# Patient Record
Sex: Female | Born: 1935 | Race: Black or African American | Hispanic: No | Smoking: Never smoker
Health system: Southern US, Community
[De-identification: ages and names within clinical notes are randomized; demographics above are authoritative.]

## PROBLEM LIST (undated history)

## (undated) DIAGNOSIS — K5790 Diverticulosis of intestine, part unspecified, without perforation or abscess without bleeding: Secondary | ICD-10-CM

## (undated) DIAGNOSIS — M1990 Unspecified osteoarthritis, unspecified site: Secondary | ICD-10-CM

## (undated) DIAGNOSIS — M4800 Spinal stenosis, site unspecified: Secondary | ICD-10-CM

## (undated) DIAGNOSIS — IMO0001 Reserved for inherently not codable concepts without codable children: Secondary | ICD-10-CM

## (undated) DIAGNOSIS — I10 Essential (primary) hypertension: Secondary | ICD-10-CM

## (undated) DIAGNOSIS — I700 Atherosclerosis of aorta: Secondary | ICD-10-CM

## (undated) DIAGNOSIS — G629 Polyneuropathy, unspecified: Secondary | ICD-10-CM

## (undated) HISTORY — PX: CHOLECYSTECTOMY: SHX55

---

## 1998-02-11 ENCOUNTER — Emergency Department (HOSPITAL_COMMUNITY): Admission: EM | Admit: 1998-02-11 | Discharge: 1998-02-11 | Payer: Self-pay | Admitting: Emergency Medicine

## 1998-08-17 ENCOUNTER — Emergency Department (HOSPITAL_COMMUNITY): Admission: EM | Admit: 1998-08-17 | Discharge: 1998-08-17 | Payer: Self-pay | Admitting: Emergency Medicine

## 1998-08-17 ENCOUNTER — Encounter: Payer: Self-pay | Admitting: Internal Medicine

## 1998-10-30 ENCOUNTER — Ambulatory Visit (HOSPITAL_COMMUNITY): Admission: RE | Admit: 1998-10-30 | Discharge: 1998-10-30 | Payer: Self-pay | Admitting: Internal Medicine

## 1999-11-25 ENCOUNTER — Ambulatory Visit (HOSPITAL_COMMUNITY): Admission: RE | Admit: 1999-11-25 | Discharge: 1999-11-25 | Payer: Self-pay | Admitting: Internal Medicine

## 1999-11-25 ENCOUNTER — Encounter: Payer: Self-pay | Admitting: Internal Medicine

## 2000-09-21 ENCOUNTER — Encounter: Payer: Self-pay | Admitting: Internal Medicine

## 2000-09-21 ENCOUNTER — Encounter: Admission: RE | Admit: 2000-09-21 | Discharge: 2000-09-21 | Payer: Self-pay | Admitting: Internal Medicine

## 2000-11-30 ENCOUNTER — Encounter: Payer: Self-pay | Admitting: Internal Medicine

## 2000-11-30 ENCOUNTER — Ambulatory Visit (HOSPITAL_COMMUNITY): Admission: RE | Admit: 2000-11-30 | Discharge: 2000-11-30 | Payer: Self-pay | Admitting: Internal Medicine

## 2002-05-21 ENCOUNTER — Encounter: Payer: Self-pay | Admitting: Internal Medicine

## 2002-05-21 ENCOUNTER — Encounter: Admission: RE | Admit: 2002-05-21 | Discharge: 2002-05-21 | Payer: Self-pay | Admitting: Internal Medicine

## 2002-05-29 ENCOUNTER — Encounter: Payer: Self-pay | Admitting: Internal Medicine

## 2002-05-29 ENCOUNTER — Ambulatory Visit (HOSPITAL_COMMUNITY): Admission: RE | Admit: 2002-05-29 | Discharge: 2002-05-29 | Payer: Self-pay | Admitting: Internal Medicine

## 2003-02-11 ENCOUNTER — Emergency Department (HOSPITAL_COMMUNITY): Admission: EM | Admit: 2003-02-11 | Discharge: 2003-02-11 | Payer: Self-pay | Admitting: Emergency Medicine

## 2003-02-11 ENCOUNTER — Encounter: Payer: Self-pay | Admitting: Emergency Medicine

## 2003-07-27 ENCOUNTER — Emergency Department (HOSPITAL_COMMUNITY): Admission: AC | Admit: 2003-07-27 | Discharge: 2003-07-27 | Payer: Self-pay | Admitting: Emergency Medicine

## 2003-11-29 ENCOUNTER — Encounter: Admission: RE | Admit: 2003-11-29 | Discharge: 2003-11-29 | Payer: Self-pay | Admitting: Internal Medicine

## 2003-12-05 ENCOUNTER — Encounter: Admission: RE | Admit: 2003-12-05 | Discharge: 2003-12-05 | Payer: Self-pay | Admitting: Internal Medicine

## 2003-12-12 ENCOUNTER — Encounter: Admission: RE | Admit: 2003-12-12 | Discharge: 2003-12-12 | Payer: Self-pay | Admitting: Internal Medicine

## 2005-05-20 ENCOUNTER — Encounter: Admission: RE | Admit: 2005-05-20 | Discharge: 2005-05-20 | Payer: Self-pay | Admitting: Internal Medicine

## 2005-10-24 ENCOUNTER — Encounter: Admission: RE | Admit: 2005-10-24 | Discharge: 2005-10-24 | Payer: Self-pay | Admitting: Internal Medicine

## 2006-07-09 ENCOUNTER — Emergency Department (HOSPITAL_COMMUNITY): Admission: EM | Admit: 2006-07-09 | Discharge: 2006-07-09 | Payer: Self-pay | Admitting: Emergency Medicine

## 2006-08-16 ENCOUNTER — Encounter: Admission: RE | Admit: 2006-08-16 | Discharge: 2006-08-16 | Payer: Self-pay | Admitting: Internal Medicine

## 2006-11-22 ENCOUNTER — Ambulatory Visit (HOSPITAL_COMMUNITY): Admission: RE | Admit: 2006-11-22 | Discharge: 2006-11-22 | Payer: Self-pay | Admitting: Gastroenterology

## 2007-08-22 ENCOUNTER — Ambulatory Visit (HOSPITAL_COMMUNITY): Admission: RE | Admit: 2007-08-22 | Discharge: 2007-08-22 | Payer: Self-pay | Admitting: Internal Medicine

## 2008-11-06 ENCOUNTER — Encounter: Admission: RE | Admit: 2008-11-06 | Discharge: 2008-11-06 | Payer: Self-pay | Admitting: Internal Medicine

## 2009-02-21 ENCOUNTER — Encounter: Admission: RE | Admit: 2009-02-21 | Discharge: 2009-02-21 | Payer: Self-pay | Admitting: Internal Medicine

## 2009-02-27 ENCOUNTER — Encounter: Admission: RE | Admit: 2009-02-27 | Discharge: 2009-02-27 | Payer: Self-pay | Admitting: Internal Medicine

## 2010-06-16 ENCOUNTER — Encounter: Admission: RE | Admit: 2010-06-16 | Discharge: 2010-06-16 | Payer: Self-pay | Admitting: Internal Medicine

## 2010-08-31 ENCOUNTER — Encounter: Payer: Self-pay | Admitting: Internal Medicine

## 2010-12-06 ENCOUNTER — Emergency Department (HOSPITAL_COMMUNITY)
Admission: EM | Admit: 2010-12-06 | Discharge: 2010-12-06 | Disposition: A | Discharge status: Home / Self Care | Payer: Medicare Other | Attending: Emergency Medicine | Admitting: Emergency Medicine

## 2010-12-06 ENCOUNTER — Emergency Department (HOSPITAL_COMMUNITY): Payer: Medicare Other

## 2010-12-06 DIAGNOSIS — E119 Type 2 diabetes mellitus without complications: Secondary | ICD-10-CM | POA: Insufficient documentation

## 2010-12-06 DIAGNOSIS — R112 Nausea with vomiting, unspecified: Secondary | ICD-10-CM | POA: Insufficient documentation

## 2010-12-06 DIAGNOSIS — R55 Syncope and collapse: Secondary | ICD-10-CM | POA: Insufficient documentation

## 2010-12-06 DIAGNOSIS — I10 Essential (primary) hypertension: Secondary | ICD-10-CM | POA: Insufficient documentation

## 2010-12-06 DIAGNOSIS — E669 Obesity, unspecified: Secondary | ICD-10-CM | POA: Insufficient documentation

## 2010-12-06 LAB — URINALYSIS, ROUTINE W REFLEX MICROSCOPIC
Bilirubin Urine: NEGATIVE
Glucose, UA: NEGATIVE mg/dL
Nitrite: NEGATIVE
Specific Gravity, Urine: 1.009 (ref 1.005–1.030)
pH: 7.5 (ref 5.0–8.0)

## 2010-12-06 LAB — DIFFERENTIAL
Basophils Absolute: 0 10*3/uL (ref 0.0–0.1)
Basophils Relative: 0 % (ref 0–1)
Eosinophils Absolute: 0.1 10*3/uL (ref 0.0–0.7)
Eosinophils Relative: 2 % (ref 0–5)
Lymphocytes Relative: 34 % (ref 12–46)
Lymphs Abs: 1.8 10*3/uL (ref 0.7–4.0)
Monocytes Absolute: 0.5 10*3/uL (ref 0.1–1.0)
Monocytes Relative: 10 % (ref 3–12)
Neutro Abs: 2.8 10*3/uL (ref 1.7–7.7)
Neutrophils Relative %: 54 % (ref 43–77)

## 2010-12-06 LAB — COMPREHENSIVE METABOLIC PANEL
AST: 32 U/L (ref 0–37)
CO2: 25 mEq/L (ref 19–32)
Calcium: 9.3 mg/dL (ref 8.4–10.5)
Creatinine, Ser: 1.09 mg/dL (ref 0.4–1.2)
GFR calc Af Amer: 58 mL/min — ABNORMAL LOW (ref >60)
GFR calc non Af Amer: 48 mL/min — ABNORMAL LOW (ref >60)
Total Protein: 7.1 g/dL (ref 6.0–8.3)

## 2010-12-06 LAB — CBC
HCT: 38.7 % (ref 36.0–46.0)
Hemoglobin: 12.4 g/dL (ref 12.0–15.0)
MCH: 26.3 pg (ref 26.0–34.0)
MCHC: 32 g/dL (ref 30.0–36.0)
MCV: 82.2 fL (ref 78.0–100.0)
Platelets: 178 10*3/uL (ref 150–400)
RBC: 4.71 MIL/uL (ref 3.87–5.11)
RDW: 14 % (ref 11.5–15.5)
WBC: 5.3 10*3/uL (ref 4.0–10.5)

## 2010-12-06 LAB — POCT CARDIAC MARKERS
CKMB, poc: 4.7 ng/mL (ref 1.0–8.0)
Myoglobin, poc: 184 ng/mL (ref 12–200)
Troponin i, poc: <0.05 ng/mL (ref 0.00–0.09)

## 2010-12-06 LAB — GLUCOSE, CAPILLARY: Glucose-Capillary: 161 mg/dL — ABNORMAL HIGH (ref 70–99)

## 2012-01-12 ENCOUNTER — Inpatient Hospital Stay (HOSPITAL_COMMUNITY)
Admission: EM | Admit: 2012-01-12 | Discharge: 2012-01-15 | DRG: 305 | Disposition: A | Discharge status: Home Health | Payer: Medicare Other | Source: Ambulatory Visit | Attending: Internal Medicine | Admitting: Internal Medicine

## 2012-01-12 ENCOUNTER — Emergency Department (HOSPITAL_COMMUNITY): Payer: Medicare Other

## 2012-01-12 ENCOUNTER — Encounter (HOSPITAL_COMMUNITY): Payer: Self-pay | Admitting: Emergency Medicine

## 2012-01-12 ENCOUNTER — Inpatient Hospital Stay (HOSPITAL_COMMUNITY): Payer: Medicare Other

## 2012-01-12 DIAGNOSIS — I160 Hypertensive urgency: Secondary | ICD-10-CM

## 2012-01-12 DIAGNOSIS — E119 Type 2 diabetes mellitus without complications: Secondary | ICD-10-CM | POA: Diagnosis present

## 2012-01-12 DIAGNOSIS — Z79899 Other long term (current) drug therapy: Secondary | ICD-10-CM

## 2012-01-12 DIAGNOSIS — E876 Hypokalemia: Secondary | ICD-10-CM | POA: Diagnosis not present

## 2012-01-12 DIAGNOSIS — F09 Unspecified mental disorder due to known physiological condition: Secondary | ICD-10-CM

## 2012-01-12 DIAGNOSIS — E1165 Type 2 diabetes mellitus with hyperglycemia: Secondary | ICD-10-CM

## 2012-01-12 DIAGNOSIS — R4189 Other symptoms and signs involving cognitive functions and awareness: Secondary | ICD-10-CM | POA: Diagnosis present

## 2012-01-12 DIAGNOSIS — I10 Essential (primary) hypertension: Principal | ICD-10-CM | POA: Diagnosis present

## 2012-01-12 DIAGNOSIS — I69919 Unspecified symptoms and signs involving cognitive functions following unspecified cerebrovascular disease: Secondary | ICD-10-CM

## 2012-01-12 DIAGNOSIS — R579 Shock, unspecified: Secondary | ICD-10-CM | POA: Diagnosis present

## 2012-01-12 DIAGNOSIS — R55 Syncope and collapse: Secondary | ICD-10-CM | POA: Diagnosis present

## 2012-01-12 DIAGNOSIS — I674 Hypertensive encephalopathy: Secondary | ICD-10-CM | POA: Diagnosis present

## 2012-01-12 HISTORY — DX: Spinal stenosis, site unspecified: M48.00

## 2012-01-12 HISTORY — DX: Essential (primary) hypertension: I10

## 2012-01-12 HISTORY — DX: Reserved for inherently not codable concepts without codable children: IMO0001

## 2012-01-12 HISTORY — DX: Unspecified osteoarthritis, unspecified site: M19.90

## 2012-01-12 HISTORY — DX: Diverticulosis of intestine, part unspecified, without perforation or abscess without bleeding: K57.90

## 2012-01-12 HISTORY — DX: Polyneuropathy, unspecified: G62.9

## 2012-01-12 HISTORY — DX: Atherosclerosis of aorta: I70.0

## 2012-01-12 LAB — COMPREHENSIVE METABOLIC PANEL
Alkaline Phosphatase: 109 U/L (ref 39–117)
BUN: 13 mg/dL (ref 6–23)
CO2: 26 mEq/L (ref 19–32)
Chloride: 102 mEq/L (ref 96–112)
GFR calc Af Amer: 60 mL/min — ABNORMAL LOW (ref >90)
Glucose, Bld: 143 mg/dL — ABNORMAL HIGH (ref 70–99)
Potassium: 3.5 mEq/L (ref 3.5–5.1)
Total Bilirubin: 1 mg/dL (ref 0.3–1.2)

## 2012-01-12 LAB — CBC
Hemoglobin: 14 g/dL (ref 12.0–15.0)
Hemoglobin: 14.2 g/dL (ref 12.0–15.0)
MCH: 26.4 pg (ref 26.0–34.0)
MCH: 26.6 pg (ref 26.0–34.0)
MCV: 80.3 fL (ref 78.0–100.0)
RBC: 5.3 MIL/uL — ABNORMAL HIGH (ref 3.87–5.11)
RBC: 5.34 MIL/uL — ABNORMAL HIGH (ref 3.87–5.11)
RDW: 14.3 % (ref 11.5–15.5)

## 2012-01-12 LAB — DIFFERENTIAL
Lymphs Abs: 1.5 10*3/uL (ref 0.7–4.0)
Monocytes Relative: 8 % (ref 3–12)
Neutro Abs: 2.5 10*3/uL (ref 1.7–7.7)
Neutrophils Relative %: 55 % (ref 43–77)

## 2012-01-12 LAB — URINALYSIS, ROUTINE W REFLEX MICROSCOPIC
Bilirubin Urine: NEGATIVE
Glucose, UA: NEGATIVE mg/dL
Ketones, ur: NEGATIVE mg/dL
Leukocytes, UA: NEGATIVE
pH: 7.5 (ref 5.0–8.0)

## 2012-01-12 LAB — PRO B NATRIURETIC PEPTIDE: Pro B Natriuretic peptide (BNP): 158 pg/mL (ref 0–450)

## 2012-01-12 LAB — BASIC METABOLIC PANEL
BUN: 12 mg/dL (ref 6–23)
CO2: 26 mEq/L (ref 19–32)
Calcium: 9.4 mg/dL (ref 8.4–10.5)
GFR calc non Af Amer: 49 mL/min — ABNORMAL LOW (ref >90)
Glucose, Bld: 147 mg/dL — ABNORMAL HIGH (ref 70–99)
Sodium: 139 mEq/L (ref 135–145)

## 2012-01-12 LAB — MRSA PCR SCREENING: MRSA by PCR: NEGATIVE

## 2012-01-12 LAB — CARDIAC PANEL(CRET KIN+CKTOT+MB+TROPI)
Relative Index: 1.6 (ref 0.0–2.5)
Total CK: 545 U/L — ABNORMAL HIGH (ref 7–177)

## 2012-01-12 LAB — LIPASE, BLOOD: Lipase: 33 U/L (ref 11–59)

## 2012-01-12 MED ORDER — ONDANSETRON HCL 4 MG/2ML IJ SOLN: 4.0000 mg | Freq: Four times a day (QID) | INTRAMUSCULAR | Status: DC | PRN

## 2012-01-12 MED ORDER — ONDANSETRON HCL 4 MG/2ML IJ SOLN
INTRAMUSCULAR | Status: AC
  Administered 2012-01-12: 4 mg via INTRAVENOUS
  Filled 2012-01-12: qty 2

## 2012-01-12 MED ORDER — NITROGLYCERIN 2 % TD OINT
1.0000 [in_us] | TOPICAL_OINTMENT | Freq: Three times a day (TID) | TRANSDERMAL | Status: DC | PRN
  Filled 2012-01-12: qty 30

## 2012-01-12 MED ORDER — INSULIN ASPART 100 UNIT/ML ~~LOC~~ SOLN
0.0000 [IU] | Freq: Three times a day (TID) | SUBCUTANEOUS | Status: DC
  Administered 2012-01-12 – 2012-01-14 (×3): 2 [IU] via SUBCUTANEOUS
  Administered 2012-01-15: 1 [IU] via SUBCUTANEOUS

## 2012-01-12 MED ORDER — VALSARTAN-HYDROCHLOROTHIAZIDE 160-12.5 MG PO TABS: 1.0000 | ORAL_TABLET | Freq: Every day | ORAL | Status: DC

## 2012-01-12 MED ORDER — LABETALOL HCL 5 MG/ML IV SOLN
10.0000 mg | Freq: Once | INTRAVENOUS | Status: DC
  Filled 2012-01-12: qty 4

## 2012-01-12 MED ORDER — IRBESARTAN 150 MG PO TABS
150.0000 mg | ORAL_TABLET | Freq: Every day | ORAL | Status: DC
  Administered 2012-01-12: 150 mg via ORAL
  Filled 2012-01-12: qty 1

## 2012-01-12 MED ORDER — ONDANSETRON HCL 4 MG PO TABS: 4.0000 mg | ORAL_TABLET | Freq: Four times a day (QID) | ORAL | Status: DC | PRN

## 2012-01-12 MED ORDER — HYDRALAZINE HCL 20 MG/ML IJ SOLN
10.0000 mg | INTRAMUSCULAR | Status: DC | PRN
Start: 2012-01-12 — End: 2012-01-15

## 2012-01-12 MED ORDER — TRAMADOL HCL 50 MG PO TABS
50.0000 mg | ORAL_TABLET | Freq: Three times a day (TID) | ORAL | Status: DC | PRN
  Filled 2012-01-12 (×2): qty 1

## 2012-01-12 MED ORDER — ONDANSETRON HCL 4 MG/2ML IJ SOLN
INTRAMUSCULAR | Status: AC
  Administered 2012-01-12: 08:00:00
  Filled 2012-01-12: qty 2

## 2012-01-12 MED ORDER — SODIUM CHLORIDE 0.9 % IV BOLUS (SEPSIS)
750.0000 mL | Freq: Once | INTRAVENOUS | Status: AC
  Administered 2012-01-12: 750 mL via INTRAVENOUS

## 2012-01-12 MED ORDER — NICARDIPINE HCL IN NACL 20-0.86 MG/200ML-% IV SOLN: 5.0000 mg/h | Freq: Once | INTRAVENOUS | Status: DC

## 2012-01-12 MED ORDER — POTASSIUM CHLORIDE 10 MEQ/100ML IV SOLN
10.0000 meq | INTRAVENOUS | Status: AC
  Administered 2012-01-12 – 2012-01-13 (×4): 10 meq via INTRAVENOUS
  Filled 2012-01-12: qty 400

## 2012-01-12 MED ORDER — CALCIUM CARBONATE ANTACID 500 MG PO CHEW
1.0000 | CHEWABLE_TABLET | Freq: Every day | ORAL | Status: DC | PRN
  Filled 2012-01-12: qty 1

## 2012-01-12 MED ORDER — SODIUM CHLORIDE 0.9 % IV SOLN
INTRAVENOUS | Status: DC
  Administered 2012-01-12: 09:00:00 via INTRAVENOUS

## 2012-01-12 MED ORDER — HYDROCHLOROTHIAZIDE 12.5 MG PO CAPS
12.5000 mg | ORAL_CAPSULE | Freq: Every day | ORAL | Status: DC
  Administered 2012-01-12: 12.5 mg via ORAL
  Filled 2012-01-12: qty 1

## 2012-01-12 MED ORDER — ONDANSETRON HCL 4 MG/2ML IJ SOLN
4.0000 mg | Freq: Once | INTRAMUSCULAR | Status: AC
  Administered 2012-01-12: 4 mg via INTRAVENOUS

## 2012-01-12 MED ORDER — NICARDIPINE HCL IN NACL 20-0.86 MG/200ML-% IV SOLN
5.0000 mg/h | INTRAVENOUS | Status: DC
  Administered 2012-01-12 (×2): 5 mg/h via INTRAVENOUS
  Filled 2012-01-12 (×3): qty 200

## 2012-01-12 NOTE — Progress Notes (Signed)
eLink Physician-Brief Progress Note Patient Name: Samantha Bond DOB: 12/28/1926 MRN: [id number]  Date of Service  01/12/2012   HPI/Events of Note  Pt on cardene drip and became hypotensive.  Pt became altered.  RN called elink for help.  Pt lethargic and BP very labile  eICU Interventions  D/C all anti HTN meds.  GIve volume. Tfr to ICU Place art line.  Tfr to PCCM primary service.   Intervention Category Major Interventions: Shock - evaluation and management  Shan Levans 01/12/2012, 9:53 PM

## 2012-01-12 NOTE — Progress Notes (Signed)
78 year old female who was earlier admitted for possible syncope and malignant hypertension and was started on Cardene had a sudden episode of unresponsiveness and her blood pressure had dropped to systolic of 40. Critical care was consulted. At this time Dr. Delford Field has requested me to examine the patient at bedside. As per the nurse patient had a brief episode of unresponsiveness for a few seconds and she came back to complete consciousness. On my exam patient is alert awake and oriented x3. She is following commands. Moves all extremities 5 x 5. No facial asymmetry. Able to see in both eyes. Tongue is midline. Chest bilateral air entry present. S1-S2 heard.  Abdomen soft nontender. Vital signs noted - blood pressure is improved to systolic of 150s. Discussed with Dr. Delford Field. Critical care is planning to manage patient overnight in ICU. Discussed with patient's daughter.

## 2012-01-12 NOTE — ED Notes (Signed)
Admitting at bedside 

## 2012-01-12 NOTE — Progress Notes (Signed)
CRITICAL VALUE ALERT  Critical value received:  CKMB 8.5  Date of notification:  01/12/2012  Time of notification:  2326  Critical value read back:yes  Nurse who received alert:  Gerre Pebbles, RN  MD notified (1st page):  Dr Mechele Dawley  Time of first page:  2326  MD notified (2nd page):  Time of second page:  Responding MD:  Dr Kendrick Fries  Time MD responded:  2326

## 2012-01-12 NOTE — Consult Note (Signed)
Name: Samantha Bond MRN: [id number] DOB: 1927-06-11    LOS: 0  Referring Provider:  [facility] Reason for Referral:  Hypertensive emergency, AMS  PULMONARY / CRITICAL CARE MEDICINE  HPI:  This is an 78 y/o female who presented to [facility] on 6/5 with inability to walk normally, and some confusion.  Apparently she developed syncope as she was found down by a neighbor so EMS was called.  She was brought to the ED for awake and alert but was found to have a blood pressure of 230/90.  She was admitted by [facility] to 2600 with a cardene gtt but her blood pressure dropped to systolic 73 and she became unresponsive the evening of 6/5.  [facility] transferred her to ICU and PCCM was asked to take over her care.  Past Medical History  Diagnosis Date  . Hypertension   . Diabetes mellitus    Past Surgical History  Procedure Date  . Cholecystectomy    Prior to Admission medications   Medication Sig Start Date End Date Taking? Authorizing Provider  calcium carbonate (TUMS - DOSED IN MG ELEMENTAL CALCIUM) 500 MG chewable tablet Chew 1 tablet by mouth daily as needed. For indigestion.   Yes Historical Provider, MD  glimepiride (AMARYL) 2 MG tablet Take 2 mg by mouth daily.   Yes Historical Provider, MD  traMADol (ULTRAM) 50 MG tablet Take 50 mg by mouth 3 (three) times daily as needed. For pain.   Yes Historical Provider, MD  valsartan-hydrochlorothiazide (DIOVAN-HCT) 160-12.5 MG per tablet Take 1 tablet by mouth daily.   Yes Historical Provider, MD   Allergies No Known Allergies  Family History History reviewed. No pertinent family history. Social History  reports that she has never smoked. She does not have any smokeless tobacco history on file. She reports that she does not drink alcohol or use illicit drugs.  Review Of Systems:  Cannot obtain due to confusion  Brief patient description:  78 y/o female admitted with confusion and hypertensive emergency who developed hypotension and AMS on a nicardipine  gtt.  Events Since Admission:   Current Status:  Vital Signs: Temp:  [97.3 F (36.3 C)-98.7 F (37.1 C)] 98.7 F (37.1 C) (06/05 2048) Pulse Rate:  [45-85] 67  (06/05 2210) Resp:  [10-23] 18  (06/05 2210) BP: (73-230)/(48-162) 156/74 mmHg (06/05 2210) SpO2:  [91 %-100 %] 100 % (06/05 2210) FiO2 (%):  [35 %] 35 % (06/05 2144) Weight:  [81.7 kg (180 lb 1.9 oz)] 81.7 kg (180 lb 1.9 oz) (06/05 1442)  Physical Examination: Gen: asleep but arouses easily, no acute distress HEENT: NCAT, EOMi, OP clear,  PULM: CTA B CV: RRR, no mgr, no JVD AB: BS+, soft, nontender, no hsm Ext: warm, trace edema, no clubbing, no cyanosis Derm: no rash or skin breakdown Neuro: Awake and alert, clear speech but not oriented to situation, time or date, moves all four extremities well but will not cooperate with neurologic exam   Active Problems:  Malignant hypertensive urgency  DM (diabetes mellitus)  Cognitive decline  Syncope   ASSESSMENT AND PLAN  PULMONARY No results found for this basename: PHART:5,PCO2:5,PCO2ART:5,PO2ART:5,HCO3:5,O2SAT:5 in the last 168 hours Ventilator Settings: Vent Mode:  [-]  FiO2 (%):  [35 %] 35 % CXR:  6/5 no acute cardiopulmonary disease ETT:  n/a  A:  No acute issues P:   -monitor O2 saturation/respiratory status  CARDIOVASCULAR  Lab 01/12/12 0914 01/12/12 0909  TROPONINI -- <0.30  LATICACIDVEN 1.4 --  PROBNP -- --  ECG:  6/5 NSR, LVH, no ST wave changes Lines: peripheral  A: Hypertensive emergency and hypertensive encephalopathy; labile blood pressure with hypotension and confusion (resolved with stopping nicardipine and giving fluids) P:  -highest recorded pressure in EPIC 230/99 -Goal BP ~180/100 -hold nicardipine now -agree with IVF -restart home ARB and HCTZ in AM if BP normalizes -will use prn nitroglycerine and hydralazine tonight if SBP >180  A: Syncope x2 (once at home and again 6/5 in house); DDx labile BP (exacerbated by nicardipine  vs. Bradycardia).  With tonight's episode of hypotension and AMS she had sinus brady followed by junctional brady (see printed strips from 21:34 in chart) P: -Agree with TTE -check carotid dopplers -consult EP in AM 6/6 to review tele strip, perhaps cause of her syncope vs. Vagal event?  RENAL  Lab 01/12/12 2212 01/12/12 0908  NA 139 139  K 3.1* 3.5  CL 100 102  CO2 26 26  BUN 12 13  CREATININE 1.02 0.98  CALCIUM 9.4 9.6  MG -- --  PHOS -- --   Intake/Output      06/05 0701 - 06/06 0700   P.O. 50   I.V. (mL/kg) 300 (3.7)   Total Intake(mL/kg) 350 (4.3)   Urine (mL/kg/hr) 300 (0.2)   Total Output 300   Net +50        Foley:    A:  Hypokalemia P:   -replete  GASTROINTESTINAL  Lab 01/12/12 0908  AST 29  ALT 14  ALKPHOS 109  BILITOT 1.0  PROT 7.4  ALBUMIN 4.0    A:  No issues P:   NTD  HEMATOLOGIC  Lab 01/12/12 2212 01/12/12 0908  HGB 14.2 14.0  HCT 42.9 42.8  PLT 174 165  INR -- --  APTT -- --   A:  No issues P: NTD  INFECTIOUS  Lab 01/12/12 2212 01/12/12 0915 01/12/12 0908  WBC 5.2 -- 4.6  PROCALCITON -- <0.10 --   Cultures:  Antibiotics:   A:  No issues P:   NTD  ENDOCRINE  Lab 01/12/12 2143 01/12/12 1735  GLUCAP 175* 195*   A:  DM2 (glucose normal at time of syncope tonight) P:   -SSI, CBG  NEUROLOGIC  A:  Syncope followed by delirium with hypertensive encephalopathy; now with no signs of focal vascular event but sundowning; doubt PRESS as vision intact and no seizures P:   -BP management as above -frequent orientation, reassurance by daughter -if still confused in AM, would consider MRI brain  BEST PRACTICE / DISPOSITION Level of Care:  ICU Primary Service:  PCCM Consultants:   Code Status:  Full Diet:  npo DVT Px:  scd GI Px:  n/a Skin Integrity:  normal Social / Family:  Updated at bedside  Total CC time tonight 45 minutes.  Max Fickle, M.D. Pulmonary and Critical Care Medicine [facility] Pager: [phone]  01/12/2012, 11:22 PM

## 2012-01-12 NOTE — ED Notes (Signed)
Family at bedside. Family updated on pt's condition

## 2012-01-12 NOTE — ED Notes (Signed)
PT refused In and Out Cath. Put PT on bedside.

## 2012-01-12 NOTE — Progress Notes (Signed)
Utilization Review Completed.Dorcas Carrow T6/12/2011

## 2012-01-12 NOTE — ED Notes (Signed)
Patient transported to CT 

## 2012-01-12 NOTE — ED Notes (Signed)
Per EMS, pt from home, pt reports waking up "feeling bad" and walked outside to get fresh air, a bystander walking found pt lying on the front porch unconscious, upon EMS arrival pt lying supine on front porch, incontinent of urine, responding to verbal stimuli, manual BP 240/100, (R) side facial droop, bilateral equal grips and strengths, pt c/o nausea, denies chest pain or sob, pt w/20 g (R) hand, received Zofran 4 mg IVP en route.

## 2012-01-12 NOTE — Progress Notes (Signed)
Spoke with Ms. Ludwig Lean, pt's daughter.  She stated that pt has not been taking her  BP med in the past 2 weeks because she could not find it.  Sometimes, she forgot if she has taken it or not.  Encouraged Ms. Cummings to prepare her mother's med in a pill box for her to take as scheduled.  She verbalized understanding.  She stated that she lives closed by her mother.

## 2012-01-12 NOTE — Progress Notes (Signed)
Pt was symptomatic with sudden drop in blood pressure (systolic of 73) and heart rate 44. Nicardipine drip was discontinued and bolus of normal saline was given per MD orders. Patient is to be transferred to the ICU

## 2012-01-12 NOTE — ED Notes (Signed)
Patient undressed and in a gown. Cardiac monitor, blood pressure cuff, and pulse oximetry on.

## 2012-01-12 NOTE — H&P (Signed)
PCP:   Pearla Dubonnet, MD, MD   Chief Complaint:  Not feeling right  HPI: [name] is a 78 year old female with history of hypertension, diabetes, memory problems was in her usual state of health until yesterday. This morning upon waking up she did not feel quite right, she felt very hot and also recalls not being able to walk properly, and then recalls walking outside to get the newspaper,after that doesn't remember anything. She was seen by passersby, passed out on her porch, subsequently EMS was called upon their arrival she was alert awake oriented x3 Upon arrival in emergency room noting a blood pressure of 230/90, she denies any complaints at this time except for mild pain in her left buttock. Denies any chest pain, shortness of breath,  Palpitations, weakness or numbness of any extremities denies any tongue biting, no history of witnessed seizures or bowel or bladder incontinence. She is unable to recall if she's been taking her medicines regularly, remembers misplacing her medicines 2 weeks ago, then found them a week back .   Allergies:  No Known Allergies    Past Medical History  Diagnosis Date  . Hypertension   . Diabetes mellitus   Memory decline  Past Surgical History  Procedure Date  . Cholecystectomy     Prior to Admission medications   Medication Sig Start Date End Date Taking? Authorizing Provider  calcium carbonate (TUMS - DOSED IN MG ELEMENTAL CALCIUM) 500 MG chewable tablet Chew 1 tablet by mouth daily as needed. For indigestion.   Yes Historical Provider, MD  glimepiride (AMARYL) 2 MG tablet Take 2 mg by mouth daily.   Yes Historical Provider, MD  traMADol (ULTRAM) 50 MG tablet Take 50 mg by mouth 3 (three) times daily as needed. For pain.   Yes Historical Provider, MD  valsartan-hydrochlorothiazide (DIOVAN-HCT) 160-12.5 MG per tablet Take 1 tablet by mouth daily.   Yes Historical Provider, MD    Social History: lives at home alone, denies any  alcohol or tobacco use, daughter lives close by  No family history on file.  Review of Systems:  Constitutional: Denies fever, chills, diaphoresis, appetite change and fatigue.  HEENT: Denies photophobia, eye pain, redness, hearing loss, ear pain, congestion, sore throat, rhinorrhea, sneezing, mouth sores, trouble swallowing, neck pain, neck stiffness and tinnitus.   Respiratory: Denies SOB, DOE, cough, chest tightness,  and wheezing.   Cardiovascular: Denies chest pain, palpitations and leg swelling.  Gastrointestinal: Denies nausea, vomiting, abdominal pain, diarrhea, constipation, blood in stool and abdominal distention.  Genitourinary: Denies dysuria, urgency, frequency, hematuria, flank pain and difficulty urinating.  Musculoskeletal: Denies myalgias, back pain, joint swelling, arthralgias and gait problem.  Skin: Denies pallor, rash and wound.  Neurological: Denies dizziness, seizures, syncope, weakness, light-headedness, numbness and headaches.  Hematological: Denies adenopathy. Easy bruising, personal or family bleeding history  Psychiatric/Behavioral: Denies suicidal ideation, mood changes, confusion, nervousness, sleep disturbance and agitation   Physical Exam: Blood pressure 199/125, pulse 63, temperature 97.3 F (36.3 C), temperature source Oral, resp. rate 15, SpO2 95.00%. Gen: AAOx3, no distress CVS: SiS2/RRR, no m/r/g Lungs: CTAB Abd: soft, NT, BS present Ext: no edema, c/c Neuro: no focal deficits, neuro 5/5, sensations light touch intact Reflexes:2 plus b/l, plantars down going, cranial nerves 2-12 intact, gait not assessed  Labs on Admission:  Results for orders placed during the hospital encounter of 01/12/12 (from the past 48 hour(s))  CBC     Status: Abnormal   Collection Time   01/12/12  9:08 AM  Component Value Range Comment   WBC 4.6  4.0 - 10.5 (K/uL)    RBC 5.30 (*) 3.87 - 5.11 (MIL/uL)    Hemoglobin 14.0  12.0 - 15.0 (g/dL)    HCT 14.7  82.9 - 56.2  (%)    MCV 80.8  78.0 - 100.0 (fL)    MCH 26.4  26.0 - 34.0 (pg)    MCHC 32.7  30.0 - 36.0 (g/dL)    RDW 13.0  86.5 - 78.4 (%)    Platelets 165  150 - 400 (K/uL)   DIFFERENTIAL     Status: Normal   Collection Time   01/12/12  9:08 AM      Component Value Range Comment   Neutrophils Relative 55  43 - 77 (%)    Neutro Abs 2.5  1.7 - 7.7 (K/uL)    Lymphocytes Relative 34  12 - 46 (%)    Lymphs Abs 1.5  0.7 - 4.0 (K/uL)    Monocytes Relative 8  3 - 12 (%)    Monocytes Absolute 0.4  0.1 - 1.0 (K/uL)    Eosinophils Relative 2  0 - 5 (%)    Eosinophils Absolute 0.1  0.0 - 0.7 (K/uL)    Basophils Relative 0  0 - 1 (%)    Basophils Absolute 0.0  0.0 - 0.1 (K/uL)   COMPREHENSIVE METABOLIC PANEL     Status: Abnormal   Collection Time   01/12/12  9:08 AM      Component Value Range Comment   Sodium 139  135 - 145 (mEq/L)    Potassium 3.5  3.5 - 5.1 (mEq/L)    Chloride 102  96 - 112 (mEq/L)    CO2 26  19 - 32 (mEq/L)    Glucose, Bld 143 (*) 70 - 99 (mg/dL)    BUN 13  6 - 23 (mg/dL)    Creatinine, Ser 6.96  0.50 - 1.10 (mg/dL)    Calcium 9.6  8.4 - 10.5 (mg/dL)    Total Protein 7.4  6.0 - 8.3 (g/dL)    Albumin 4.0  3.5 - 5.2 (g/dL)    AST 29  0 - 37 (U/L) HEMOLYSIS AT THIS LEVEL MAY AFFECT RESULT   ALT 14  0 - 35 (U/L)    Alkaline Phosphatase 109  39 - 117 (U/L)    Total Bilirubin 1.0  0.3 - 1.2 (mg/dL)    GFR calc non Af Amer 52 (*) >90 (mL/min)    GFR calc Af Amer 60 (*) >90 (mL/min)   LIPASE, BLOOD     Status: Normal   Collection Time   01/12/12  9:08 AM      Component Value Range Comment   Lipase 33  11 - 59 (U/L)   TROPONIN I     Status: Normal   Collection Time   01/12/12  9:09 AM      Component Value Range Comment   Troponin I <0.30  <0.30 (ng/mL)   LACTIC ACID, PLASMA     Status: Normal   Collection Time   01/12/12  9:14 AM      Component Value Range Comment   Lactic Acid, Venous 1.4  0.5 - 2.2 (mmol/L)   PROCALCITONIN     Status: Normal   Collection Time   01/12/12  9:15 AM       Component Value Range Comment   Procalcitonin <0.10     URINALYSIS, ROUTINE W REFLEX MICROSCOPIC     Status: Normal  Collection Time   01/12/12 10:18 AM      Component Value Range Comment   Color, Urine YELLOW  YELLOW     APPearance CLEAR  CLEAR     Specific Gravity, Urine 1.013  1.005 - 1.030     pH 7.5  5.0 - 8.0     Glucose, UA NEGATIVE  NEGATIVE (mg/dL)    Hgb urine dipstick NEGATIVE  NEGATIVE     Bilirubin Urine NEGATIVE  NEGATIVE     Ketones, ur NEGATIVE  NEGATIVE (mg/dL)    Protein, ur NEGATIVE  NEGATIVE (mg/dL)    Urobilinogen, UA 1.0  0.0 - 1.0 (mg/dL)    Nitrite NEGATIVE  NEGATIVE     Leukocytes, UA NEGATIVE  NEGATIVE  MICROSCOPIC NOT DONE ON URINES WITH NEGATIVE PROTEIN, BLOOD, LEUKOCYTES, NITRITE, OR GLUCOSE <1000 mg/dL.    Radiological Exams on Admission: Dg Chest 2 View  01/12/2012  *RADIOLOGY REPORT*  Clinical Data: Cough.  Syncope.  Fall.  History of diabetes and hypertension.  CHEST - 2 VIEW  Comparison: Portable chest x-ray 12/06/2010 and CT chest 12/05/2003.  Findings: Cardiac silhouette moderately enlarged but stable. Pulmonary venous hypertension without overt edema.  Lungs clear. Bronchovascular markings normal.  Pulmonary vascularity normal.  No pneumothorax.  No pleural effusions.  Mild degenerative changes involving the thoracic spine and generalized osteopenia suspected.  IMPRESSION: Stable cardiomegaly.  Pulmonary venous hypertension without overt edema.  No acute cardiopulmonary disease.  Original Report Authenticated By: Arnell Sieving, M.D.   Ct Head Wo Contrast  01/12/2012  *RADIOLOGY REPORT*  Clinical Data: Altered mental status.  No acute injury.  CT HEAD WITHOUT CONTRAST  Technique:  Contiguous axial images were obtained from the base of the skull through the vertex without contrast.  Comparison: Head CT 07/09/2006.  Findings: There is extensive chronic periventricular white matter disease which appears stable.  No acute cortical infarct is identified.   There is no evidence of acute intracranial hemorrhage, mass lesion, brain edema or extra-axial fluid collection. Ventricular dilatation does not appear significantly changed.  The visualized paranasal sinuses and mastoid air cells are clear. The calvarium is intact.  IMPRESSION: Stable chronic small vessel ischemic changes and atrophy.  No acute intracranial findings identified.  Original Report Authenticated By: Gerrianne Scale, M.D.   EKG: with LVH    Assessment/Plan 1. Malignant hypertensive urgency 2. Suspected syncope 3.  DM (diabetes mellitus) 4.  Cognitive decline 5. LVH  Continue Nicardipine drip to lower BP by 25% today Resume PO Valsartan /HCTZ, will probably need additional PO meds, will not do all changes at once to avoid precipitous drop in BP. Will need assistance at home to ensure compliance especially given cognitive decline Suspect Syncope was secondary to 1, will monitor on Tele and get 2D echo Hold oral hypoglycemics, Novolog SSI for now. Code status discussed with Patient, wishes to be Full Code  Time Spent on Admission:  [facility] Pager: [phone] 01/12/2012, 1:34 PM

## 2012-01-12 NOTE — Progress Notes (Signed)
Explained to patient and daughter that since patient is newly admitted, bed alarm will be on to  Alarm staff that pt is trying to get out of bed.  This will protect the patient from falling on the floor.  When the bed alarm goes off, staff will be right here to check on the patient.  They both verbalized understanding.

## 2012-01-12 NOTE — ED Provider Notes (Addendum)
History     CSN: [id number]  Arrival date & time 01/12/12  0805   First MD Initiated Contact with Patient 01/12/12 [phone]      Chief Complaint  Patient presents with  . Loss of Consciousness    HPI Pt was seen at 0835.  Per pt, c/o gradual onset and persistence of constantly "not feeling well" since yesterday.  Has been associated with feeling nauseated.  EMS states pt reportedly went outside this morning "for some fresh air" and was found by a bystander unresponsive and incont of urine. Upon EMS arrival to scene, pt was A&O with her only complaint of feeling "weak all over."  Denies CP/palpitations, no cough/SOB, no abd pain, no back pain, no fevers, no N/V/D, no focal motor weakness, no tingling/numbness in extremities.     Past Medical History  Diagnosis Date  . Hypertension   . Diabetes mellitus     Past Surgical History  Procedure Date  . Cholecystectomy      History  Substance Use Topics  . Smoking status: Never Smoker   . Smokeless tobacco: Not on file  . Alcohol Use: No    Review of Systems ROS: Statement: All systems negative except as marked or noted in the HPI; Constitutional: Negative for fever and chills. ; ; Eyes: Negative for eye pain, redness and discharge. ; ; ENMT: Negative for ear pain, hoarseness, nasal congestion, sinus pressure and sore throat. ; ; Cardiovascular: Negative for chest pain, palpitations, diaphoresis, dyspnea and peripheral edema. ; ; Respiratory: Negative for cough, wheezing and stridor. ; ; Gastrointestinal: Negative for nausea, vomiting, diarrhea, abdominal pain, blood in stool, hematemesis, jaundice and rectal bleeding. . ; ; Genitourinary: Negative for dysuria, flank pain and hematuria. ; ; Musculoskeletal: Negative for back pain and neck pain. Negative for swelling and trauma.; ; Skin: Negative for pruritus, rash, abrasions, blisters, bruising and skin lesion.; ; Neuro: Negative for headache, lightheadedness and neck stiffness. Negative for  altered level of consciousness , altered mental status, extremity weakness, paresthesias, involuntary movement, seizure and +generalized weakness, syncope.     Allergies  Review of patient's allergies indicates no known allergies.  Home Medications   Current Outpatient Rx  Name Route Sig Dispense Refill  . CALCIUM CARBONATE ANTACID 500 MG PO CHEW Oral Chew 1 tablet by mouth daily as needed. For indigestion.    Marland Kitchen GLIMEPIRIDE 2 MG PO TABS Oral Take 2 mg by mouth daily.    . TRAMADOL HCL 50 MG PO TABS Oral Take 50 mg by mouth 3 (three) times daily as needed. For pain.    Marland Kitchen VALSARTAN-HYDROCHLOROTHIAZIDE 160-12.5 MG PO TABS Oral Take 1 tablet by mouth daily.      BP 227/98  Pulse 60  Temp(Src) 97.3 F (36.3 C) (Oral)  Resp 15  SpO2 96%  Physical Exam 0845: Physical examination:  Nursing notes reviewed; Vital signs and O2 SAT reviewed;  Constitutional: Well developed, Well nourished, In no acute distress; Head:  Normocephalic, atraumatic; Eyes: EOMI, PERRL, No scleral icterus; ENMT: Mouth and pharynx normal, Mucous membranes dry; Neck: Supple, Full range of motion, No lymphadenopathy; Cardiovascular: Regular rate and rhythm, No murmur or gallop; Respiratory: Breath sounds clear & equal bilaterally, No rales, rhonchi, wheezes, or rub, Normal respiratory effort/excursion; Chest: Nontender, Movement normal; Abdomen: Soft, Nontender, Nondistended, Normal bowel sounds;; Extremities: Pulses normal, No tenderness, No edema, No calf edema or asymmetry.; Neuro: AA&Ox3, Major CN grossly intact. Speech clear, no facial droop. No gross focal motor or sensory deficits in  extremities.; Skin: Color normal, Warm, Dry.    ED Course  Procedures    MDM  MDM Reviewed: previous chart, nursing note and vitals Reviewed previous: ECG Interpretation: ECG, labs, x-ray and CT scan      Date: 01/12/2012  Rate: 55  Rhythm: normal sinus rhythm and premature atrial contractions (PAC)  QRS Axis: normal   Intervals: normal  ST/T Wave abnormalities: normal  Conduction Disutrbances:nonspecific intraventricular conduction delay  Narrative Interpretation: LVH  Old EKG Reviewed: unchanged; no significant changes from previous EKG dated 12/06/2010.  Results for orders placed during the hospital encounter of 01/12/12  CBC      Component Value Range   WBC 4.6  4.0 - 10.5 (K/uL)   RBC 5.30 (*) 3.87 - 5.11 (MIL/uL)   Hemoglobin 14.0  12.0 - 15.0 (g/dL)   HCT 16.1  09.6 - 04.5 (%)   MCV 80.8  78.0 - 100.0 (fL)   MCH 26.4  26.0 - 34.0 (pg)   MCHC 32.7  30.0 - 36.0 (g/dL)   RDW 40.9  81.1 - 91.4 (%)   Platelets 165  150 - 400 (K/uL)  DIFFERENTIAL      Component Value Range   Neutrophils Relative 55  43 - 77 (%)   Neutro Abs 2.5  1.7 - 7.7 (K/uL)   Lymphocytes Relative 34  12 - 46 (%)   Lymphs Abs 1.5  0.7 - 4.0 (K/uL)   Monocytes Relative 8  3 - 12 (%)   Monocytes Absolute 0.4  0.1 - 1.0 (K/uL)   Eosinophils Relative 2  0 - 5 (%)   Eosinophils Absolute 0.1  0.0 - 0.7 (K/uL)   Basophils Relative 0  0 - 1 (%)   Basophils Absolute 0.0  0.0 - 0.1 (K/uL)  COMPREHENSIVE METABOLIC PANEL      Component Value Range   Sodium 139  135 - 145 (mEq/L)   Potassium 3.5  3.5 - 5.1 (mEq/L)   Chloride 102  96 - 112 (mEq/L)   CO2 26  19 - 32 (mEq/L)   Glucose, Bld 143 (*) 70 - 99 (mg/dL)   BUN 13  6 - 23 (mg/dL)   Creatinine, Ser 7.82  0.50 - 1.10 (mg/dL)   Calcium 9.6  8.4 - 95.6 (mg/dL)   Total Protein 7.4  6.0 - 8.3 (g/dL)   Albumin 4.0  3.5 - 5.2 (g/dL)   AST 29  0 - 37 (U/L)   ALT 14  0 - 35 (U/L)   Alkaline Phosphatase 109  39 - 117 (U/L)   Total Bilirubin 1.0  0.3 - 1.2 (mg/dL)   GFR calc non Af Amer 52 (*) >90 (mL/min)   GFR calc Af Amer 60 (*) >90 (mL/min)  LIPASE, BLOOD      Component Value Range   Lipase 33  11 - 59 (U/L)  LACTIC ACID, PLASMA      Component Value Range   Lactic Acid, Venous 1.4  0.5 - 2.2 (mmol/L)  PROCALCITONIN      Component Value Range   Procalcitonin <0.10      TROPONIN I      Component Value Range   Troponin I <0.30  <0.30 (ng/mL)  URINALYSIS, ROUTINE W REFLEX MICROSCOPIC      Component Value Range   Color, Urine YELLOW  YELLOW    APPearance CLEAR  CLEAR    Specific Gravity, Urine 1.013  1.005 - 1.030    pH 7.5  5.0 - 8.0    Glucose,  UA NEGATIVE  NEGATIVE (mg/dL)   Hgb urine dipstick NEGATIVE  NEGATIVE    Bilirubin Urine NEGATIVE  NEGATIVE    Ketones, ur NEGATIVE  NEGATIVE (mg/dL)   Protein, ur NEGATIVE  NEGATIVE (mg/dL)   Urobilinogen, UA 1.0  0.0 - 1.0 (mg/dL)   Nitrite NEGATIVE  NEGATIVE    Leukocytes, UA NEGATIVE  NEGATIVE     Dg Chest 2 View 01/12/2012  *RADIOLOGY REPORT*  Clinical Data: Cough.  Syncope.  Fall.  History of diabetes and hypertension.  CHEST - 2 VIEW  Comparison: Portable chest x-ray 12/06/2010 and CT chest 12/05/2003.  Findings: Cardiac silhouette moderately enlarged but stable. Pulmonary venous hypertension without overt edema.  Lungs clear. Bronchovascular markings normal.  Pulmonary vascularity normal.  No pneumothorax.  No pleural effusions.  Mild degenerative changes involving the thoracic spine and generalized osteopenia suspected.  IMPRESSION: Stable cardiomegaly.  Pulmonary venous hypertension without overt edema.  No acute cardiopulmonary disease.  Original Report Authenticated By: Arnell Sieving, M.D.   Ct Head Wo Contrast 01/12/2012  *RADIOLOGY REPORT*  Clinical Data: Altered mental status.  No acute injury.  CT HEAD WITHOUT CONTRAST  Technique:  Contiguous axial images were obtained from the base of the skull through the vertex without contrast.  Comparison: Head CT 07/09/2006.  Findings: There is extensive chronic periventricular white matter disease which appears stable.  No acute cortical infarct is identified.  There is no evidence of acute intracranial hemorrhage, mass lesion, brain edema or extra-axial fluid collection. Ventricular dilatation does not appear significantly changed.  The visualized paranasal  sinuses and mastoid air cells are clear. The calvarium is intact.  IMPRESSION: Stable chronic small vessel ischemic changes and atrophy.  No acute intracranial findings identified.  Original Report Authenticated By: Gerrianne Scale, M.D.     11:53 AM:   Pt continues HTN, has hx of same.  Do not want to precipitously drop MAP more than 20-25% at this time, will give dose of IVP labetalol.  No signs of acute encephalopathy, continues AAOx3, neuro exam unchanged.  Denies CP, no SOB, no abd pain.  Dx testing d/w pt and family.  Questions answered.  Verb understanding, agreeable to admit. T/C to [facility], case discussed, including:  HPI, pertinent PM/SHx, VS/PE, dx testing, ED course and treatment:  Agreeable to admit, requests to start IV cardene gtt instead IVP dose of labetalol, obtain step down bed to team 5, she will be down to ED for eval.         Laray Anger, DO 01/14/12 1531

## 2012-01-12 NOTE — ED Notes (Signed)
Pt reports waking 0700 this am "feeling so hot" decided to go outside to get her newspaper and some fresh air, reports she felt weak, "could hardly go," got hot and set down on her front porch, states "I had no strength anymore." Pt c/o nauseous and generalized abd discomfort, pt denies chest/back pain, cough, sob, or diarrhea. Pt reports diaphoresis has subsided, pt presents to ED vomiting and diaphoretic. Pt a/o x4, neuro intact, no arm drift, equal strengths and grips.

## 2012-01-13 ENCOUNTER — Encounter (HOSPITAL_COMMUNITY): Payer: Self-pay | Admitting: Cardiology

## 2012-01-13 DIAGNOSIS — I059 Rheumatic mitral valve disease, unspecified: Secondary | ICD-10-CM

## 2012-01-13 DIAGNOSIS — R55 Syncope and collapse: Secondary | ICD-10-CM

## 2012-01-13 LAB — URINE CULTURE: Culture  Setup Time: 201306051527

## 2012-01-13 LAB — COMPREHENSIVE METABOLIC PANEL
ALT: 14 U/L (ref 0–35)
AST: 27 U/L (ref 0–37)
Alkaline Phosphatase: 102 U/L (ref 39–117)
CO2: 23 mEq/L (ref 19–32)
Calcium: 9.7 mg/dL (ref 8.4–10.5)
Chloride: 100 mEq/L (ref 96–112)
GFR calc Af Amer: 55 mL/min — ABNORMAL LOW (ref >90)
GFR calc non Af Amer: 47 mL/min — ABNORMAL LOW (ref >90)
Glucose, Bld: 127 mg/dL — ABNORMAL HIGH (ref 70–99)
Potassium: 3.9 mEq/L (ref 3.5–5.1)
Sodium: 137 mEq/L (ref 135–145)
Total Bilirubin: 0.8 mg/dL (ref 0.3–1.2)

## 2012-01-13 LAB — GLUCOSE, CAPILLARY
Glucose-Capillary: 130 mg/dL — ABNORMAL HIGH (ref 70–99)
Glucose-Capillary: 107 mg/dL — ABNORMAL HIGH (ref 70–99)

## 2012-01-13 LAB — CBC
Hemoglobin: 13.6 g/dL (ref 12.0–15.0)
MCH: 26 pg (ref 26.0–34.0)
Platelets: 179 10*3/uL (ref 150–400)
RBC: 5.24 MIL/uL — ABNORMAL HIGH (ref 3.87–5.11)
WBC: 4.9 10*3/uL (ref 4.0–10.5)

## 2012-01-13 MED ORDER — MIDAZOLAM HCL 2 MG/2ML IJ SOLN: 2.0000 mg | INTRAMUSCULAR | Status: DC | PRN

## 2012-01-13 MED ORDER — HALOPERIDOL LACTATE 5 MG/ML IJ SOLN
10.0000 mg | INTRAMUSCULAR | Status: DC | PRN
Start: 2012-01-13 — End: 2012-01-14
  Administered 2012-01-13: 10 mg via INTRAVENOUS
  Filled 2012-01-13 (×2): qty 2

## 2012-01-13 MED ORDER — HALOPERIDOL LACTATE 5 MG/ML IJ SOLN
INTRAMUSCULAR | Status: AC
  Administered 2012-01-13: 5 mg via INTRAVENOUS
  Filled 2012-01-13: qty 1

## 2012-01-13 MED ORDER — MIDAZOLAM HCL 2 MG/2ML IJ SOLN
INTRAMUSCULAR | Status: AC
  Administered 2012-01-13: 2 mg
  Filled 2012-01-13: qty 2

## 2012-01-13 MED ORDER — HALOPERIDOL LACTATE 5 MG/ML IJ SOLN
5.0000 mg | Freq: Once | INTRAMUSCULAR | Status: AC
  Administered 2012-01-13: 5 mg via INTRAVENOUS

## 2012-01-13 MED ORDER — SODIUM CHLORIDE 0.9 % IV SOLN
INTRAVENOUS | Status: DC
  Administered 2012-01-13: 01:00:00 via INTRAVENOUS

## 2012-01-13 MED ORDER — LORAZEPAM 2 MG/ML IJ SOLN: 1.0000 mg | INTRAMUSCULAR | Status: DC | PRN

## 2012-01-13 MED ORDER — HALOPERIDOL LACTATE 5 MG/ML IJ SOLN
2.0000 mg | Freq: Four times a day (QID) | INTRAMUSCULAR | Status: DC | PRN
  Administered 2012-01-14: 2 mg via INTRAVENOUS
  Filled 2012-01-13: qty 1
  Filled 2012-01-13: qty 0.4

## 2012-01-13 NOTE — Consult Note (Signed)
CARDIOLOGY CONSULT NOTE  Patient ID: Samantha Bond MRN: [id number] DOB/AGE: 01-26-1927 78 y.o.  Admit date: 01/12/2012 Primary Physician Pearla Dubonnet, MD Primary Cardiologist None Chief Complaint  Syncope, bradycardia  HPI: The patient was admitted after a syncopal episode unwitnessed.  She was brought to the ER and reported generalized weakness.  Her BP was 203/90. Per records she had not been able to find her BP meds recently and so had been off of these. She had some AMS with head CT showing no acute disease. She was admitted to step down and was treated with nicardipine. However, she subsequently developed hypotension and bradycardia.  She had worsening acute delirium and agitation.  There was a brief episode of unresponsiveness.  HR was in the 40s with some junctional rhythm.  She was moved to ICU.   We are consulted to consider further management of bradyarrhythmias.  On telemetry that I can review she has had no sustained bradyarrhythmias and no pauses.     Unfortunately the patient has been sedated recently and is currently unable to answer any questions.  I was able to speak with her daughter at the bedside.  She has had one syncopal episode in April of last year.  ER records demonstrate no clear etiology.  She does not complain to her family about chest pain, palpitations, SOB.  She reports being weak.  Past Medical History  Diagnosis Date  . Hypertension   . Diabetes mellitus   . DJD (degenerative joint disease)   . Peripheral neuropathy   . Mild aortic sclerosis   . Spinal stenosis   . Diverticulosis     Past Surgical History  Procedure Date  . Cholecystectomy     No Known Allergies Prescriptions prior to admission  Medication Sig Dispense Refill  . calcium carbonate (TUMS - DOSED IN MG ELEMENTAL CALCIUM) 500 MG chewable tablet Chew 1 tablet by mouth daily as needed. For indigestion.      Marland Kitchen glimepiride (AMARYL) 2 MG tablet Take 2 mg by mouth daily.      .  traMADol (ULTRAM) 50 MG tablet Take 50 mg by mouth 3 (three) times daily as needed. For pain.      . valsartan-hydrochlorothiazide (DIOVAN-HCT) 160-12.5 MG per tablet Take 1 tablet by mouth daily.       History reviewed. No pertinent family history. (Patient is unable to give this history.)  History   Social History  . Marital Status: Widowed    Spouse Name: N/A    Number of Children: N/A  . Years of Education: N/A   Social History Main Topics  . Smoking status: Never Smoker    Social History Narrative  . Lives alone and is very independent.  She drives     ROS: Unobtainable with the patient sedated.    Physical Exam: Blood pressure 147/77, pulse 66, temperature 98.5 F (36.9 C), temperature source Oral, resp. rate 16, height 5\' 7"  (1.702 m), weight 180 lb 1.9 oz (81.7 kg), SpO2 94.00%.  GENERAL:  Well appearing HEENT:  Pupils equal round and reactive, fundi not visualized, dry NECK:  Jugular waveform within normal limits, carotid upstroke brisk and symmetric, no bruits, no thyromegaly LYMPHATICS:  No cervical, inguinal adenopathy LUNGS:  Clear to auscultation bilaterally BACK:  No CVA tenderness CHEST:  Unremarkable HEART:  PMI not displaced or sustained,S1 and S2 within normal limits, no S3, no S4, no clicks, no rubs, no murmurs ABD:  Flat, positive bowel sounds normal in frequency in pitch, no  bruits, no rebound, no guarding, no midline pulsatile mass, no hepatomegaly, no splenomegaly EXT:  2 plus pulses throughout, no edema, no cyanosis no clubbing SKIN:  No rashes no nodules, no bruising NEURO:  Sedated.  Moves all extremities.      Labs: Lab Results  Component Value Date   BUN 13 01/13/2012   Lab Results  Component Value Date   CREATININE 1.06 01/13/2012   Lab Results  Component Value Date   NA 137 01/13/2012   K 3.9 01/13/2012   CL 100 01/13/2012   CO2 23 01/13/2012   Lab Results  Component Value Date   CKTOTAL 545* 01/12/2012   CKMB 8.5* 01/12/2012   TROPONINI  <0.30 01/12/2012   Lab Results  Component Value Date   WBC 4.9 01/13/2012   HGB 13.6 01/13/2012   HCT 42.2 01/13/2012   MCV 80.5 01/13/2012   PLT 179 01/13/2012   No results found for this basename: CHOL,  HDL,  LDLCALC,  LDLDIRECT,  TRIG,  CHOLHDL   Lab Results  Component Value Date   ALT 14 01/13/2012   AST 27 01/13/2012   ALKPHOS 102 01/13/2012   BILITOT 0.8 01/13/2012   Radiology:  CXR:  The cardiac silhouette, mediastinal and hilar contours  are within normal limits and stable. There is mild tortuosity and  calcification of the thoracic aorta. The lungs are clear. No  pleural effusion. The bony thorax is intact.  EKG:NSR, rate 56, RAE, early transition lead V1, no acute ST T wave changes.  01/12/12  ASSESSMENT AND PLAN:   1) Syncope:  No clear bradycardia causing this.  Acute hypotension and bradycardia this admit seem to be related to medical therapy.  She will require an echocardiogram and probable out patient extended monitoring if in patient no arrhythmias are identified.  Check orthostatics when we are able. Carotid Doppler has been ordered  2)  HTN:  BP has been labile.  PRN medications for now and then resume previous PO meds.  3)  Confusion:  Per primary team.    Signed: Rollene Rotunda 01/13/2012, 12:45 PM

## 2012-01-13 NOTE — Progress Notes (Signed)
eLink Physician-Brief Progress Note Patient Name: Samantha Bond DOB: September 19, 1926 MRN: [id number]  Date of Service  01/13/2012   HPI/Events of Note  Ongoing paranoid agitation with patient combative on side of bed attempting to leave.  Patient is confused.   eICU Interventions  Plan: Order for haldol dosing.   Intervention Category Major Interventions: Delirium, psychosis, severe agitation - evaluation and management  Janel Beane 01/13/2012, 1:04 AM

## 2012-01-13 NOTE — Significant Event (Signed)
Pt was NPO when agitated.  More alert now, and not requiring sedative medications.  Will start clear liquid diet.  Coralyn Helling, MD 01/13/2012, 4:48 PM

## 2012-01-13 NOTE — Progress Notes (Signed)
Pt transferred to [id number] via wheelchair, daughter at pt's side. Adria Dill RN

## 2012-01-13 NOTE — Progress Notes (Signed)
Subjective: Samantha Bond is well known to me I have not seen her in the office since August, 2012.she apparently has been off her antihypertensive medications for a while and presented to the emergency room with syncope and elevated blood pressure. Elevated blood pressure became severe and she was treated with intravenous calcium channel blockade and developed bradycardia and an episode of unresponsiveness. She is very confused this morning and somewhat paranoid. She did recognize me momentarily but is still very confused. Will not allow an examination except cursory. She's been given Haldol this a.m. And Ativan has been ordered for agitation. Blood pressure is approximately 165/110 systolic but has been higher  Objective: Weight change:   Intake/Output Summary (Last 24 hours) at 01/13/12 1629 Last data filed at 01/13/12 1400  Gross per 24 hour  Intake    730 ml  Output   1850 ml  Net  -1120 ml   Filed Vitals:   01/13/12 1100 01/13/12 1200 01/13/12 1300 01/13/12 1400  BP: 142/78 147/77 123/71 153/76  Pulse: 66 66 67 78  Temp:  98.5 F (36.9 C)    TempSrc:  Oral    Resp: 16 16 16 20   Height:      Weight:      SpO2: 96% 94% 95% 97%    Physical Exam:  Elderly female confused and somewhat paranoid and combative when approached for exam. Conversive intermittently. Does seem to recognize me but is still resisting formal exam. HEENT: grossly normal Neck: Appears to be supple without obvious JVD Chest and cardiac exam and abdominal exam could not be performed Extremities: No cyanosis clubbing or edema Neurological: No obvious focal abnormalities, confused and combative  Lab Results:  [facility] 01/13/12 0552 01/12/12 2212  NA 137 139  K 3.9 3.1*  CL 100 100  CO2 23 26  GLUCOSE 127* 147*  BUN 13 12  CREATININE 1.06 1.02  CALCIUM 9.7 9.4  MG -- --  PHOS -- --    Basename 01/13/12 0552 01/12/12 0908  AST 27 29  ALT 14 14  ALKPHOS 102 109  BILITOT 0.8 1.0  PROT 7.0 7.4    ALBUMIN 3.6 4.0    Basename 01/12/12 0908  LIPASE 33  AMYLASE --    Basename 01/13/12 0552 01/12/12 2212 01/12/12 0908  WBC 4.9 5.2 --  NEUTROABS -- -- 2.5  HGB 13.6 14.2 --  HCT 42.2 42.9 --  MCV 80.5 80.3 --  PLT 179 174 --    Basename 01/12/12 2213 01/12/12 0909  CKTOTAL 545* --  CKMB 8.5* --  CKMBINDEX -- --  TROPONINI <0.30 <0.30   No components found with this basename: POCBNP:3 No results found for this basename: DDIMER:2 in the last 72 hours No results found for this basename: HGBA1C:2 in the last 72 hours No results found for this basename: CHOL:2,HDL:2,LDLCALC:2,TRIG:2,CHOLHDL:2,LDLDIRECT:2 in the last 72 hours No results found for this basename: TSH,T4TOTAL,FREET3,T3FREE,THYROIDAB in the last 72 hours No results found for this basename: VITAMINB12:2,FOLATE:2,FERRITIN:2,TIBC:2,IRON:2,RETICCTPCT:2 in the last 72 hours  Studies/Results: Dg Chest 2 View  01/12/2012  *RADIOLOGY REPORT*  Clinical Data: Cough.  Syncope.  Fall.  History of diabetes and hypertension.  CHEST - 2 VIEW  Comparison: Portable chest x-ray 12/06/2010 and CT chest 12/05/2003.  Findings: Cardiac silhouette moderately enlarged but stable. Pulmonary venous hypertension without overt edema.  Lungs clear. Bronchovascular markings normal.  Pulmonary vascularity normal.  No pneumothorax.  No pleural effusions.  Mild degenerative changes involving the thoracic spine and generalized osteopenia suspected.  IMPRESSION:  Stable cardiomegaly.  Pulmonary venous hypertension without overt edema.  No acute cardiopulmonary disease.  Original Report Authenticated By: Arnell Sieving, M.D.   Ct Head Wo Contrast  01/12/2012  *RADIOLOGY REPORT*  Clinical Data: Altered mental status.  No acute injury.  CT HEAD WITHOUT CONTRAST  Technique:  Contiguous axial images were obtained from the base of the skull through the vertex without contrast.  Comparison: Head CT 07/09/2006.  Findings: There is extensive chronic  periventricular white matter disease which appears stable.  No acute cortical infarct is identified.  There is no evidence of acute intracranial hemorrhage, mass lesion, brain edema or extra-axial fluid collection. Ventricular dilatation does not appear significantly changed.  The visualized paranasal sinuses and mastoid air cells are clear. The calvarium is intact.  IMPRESSION: Stable chronic small vessel ischemic changes and atrophy.  No acute intracranial findings identified.  Original Report Authenticated By: Gerrianne Scale, M.D.   Dg Chest Port 1 View  01/12/2012  *RADIOLOGY REPORT*  Clinical Data: Altered mental status. Hypotension.  PORTABLE CHEST - 1 VIEW  Comparison: 01/12/2012.  Findings: The cardiac silhouette, mediastinal and hilar contours are within normal limits and stable.  There is mild tortuosity and calcification of the thoracic aorta.  The lungs are clear.  No pleural effusion.  The bony thorax is intact.  IMPRESSION: No acute cardiopulmonary findings.  Original Report Authenticated By: P. Loralie Champagne, M.D.   Medications: Scheduled Meds:   . haloperidol lactate  5 mg Intravenous Once  . insulin aspart  0-9 Units Subcutaneous TID WC  . midazolam      . midazolam      . potassium chloride  10 mEq Intravenous Q1 Hr x 4  . sodium chloride  750 mL Intravenous Once  . DISCONTD: hydrochlorothiazide  12.5 mg Oral Daily  . DISCONTD: irbesartan  150 mg Oral Daily   Continuous Infusions:   . sodium chloride 10 mL/hr at 01/13/12 0113  . DISCONTD: niCARDipine Stopped (01/12/12 2035)   PRN Meds:.calcium carbonate, haloperidol lactate, haloperidol lactate, hydrALAZINE, midazolam, nitroGLYCERIN, ondansetron (ZOFRAN) IV, ondansetron, traMADol, DISCONTD: LORazepam  Assessment/Plan: Patient Active Problem List  Diagnoses Date Noted  . Malignant hypertensive urgency - as per critical care 01/12/2012  . DM (diabetes mellitus) - aware 01/12/2012  . Confusion/agitation - likely  secondary to hypertension, malignant - continue Haldol and Ativan p.r.n. And control blood pressure. Patient will need sitter 01/12/2012  . Syncope - possibly secondary to medication for malignant hypertension Abnormal cardiac enzyme - elevated CPK - as per cardiology Appreciate assistance from critical care medicine and cardiology 01/12/2012     LOS: 1 day   Jeson Camacho NEVILL 01/13/2012, 4:29 PM

## 2012-01-13 NOTE — Consult Note (Signed)
Name: Samantha Bond MRN: [id number] DOB: 05-Dec-1926    LOS: 1  Referring Provider:  [facility] Reason for Referral:  Hypertensive emergency, AMS  PULMONARY / CRITICAL CARE MEDICINE  Brief patient description:  78 y/o female admitted with confusion and hypertensive emergency who developed hypotension and AMS on a nicardipine gtt.  Events Since Admission: 6/5- Nicardipine weaned off 6/6- confusion, combative  Current Status: agitated  Vital Signs: Temp:  [97.4 F (36.3 C)-98.7 F (37.1 C)] 97.8 F (36.6 C) (06/06 0900) Pulse Rate:  [45-86] 70  (06/06 1000) Resp:  [13-23] 15  (06/06 1000) BP: (73-230)/(48-162) 143/78 mmHg (06/06 1000) SpO2:  [91 %-100 %] 95 % (06/06 1000) FiO2 (%):  [35 %] 35 % (06/05 2144) Weight:  [81.7 kg (180 lb 1.9 oz)] 81.7 kg (180 lb 1.9 oz) (06/05 1442)  Physical Examination: Neuro : combative, moves all arms equal, walking the unit swinging at RN, MD and security to try and hurt them all HEENT: perrl 3 mm, no bruit PULM: CTA B CV: RRR, no mgr, no JVD AB: BS+, soft, nontender, no hsm Ext: warm, trace edema, no clubbing, no cyanosis Derm: no rash or skin breakdown  Active Problems:  Malignant hypertensive urgency  DM (diabetes mellitus)  Cognitive decline  Syncope   ASSESSMENT AND PLAN  PULMONARY No results found for this basename: PHART:5,PCO2:5,PCO2ART:5,PO2ART:5,HCO3:5,O2SAT:5 in the last 168 hours Ventilator Settings: Vent Mode:  [-]  FiO2 (%):  [35 %] 35 % CXR:  6/5 no acute cardiopulmonary disease ETT:  n/a  A:  No acute issues P:   -monitor O2 saturation -IS if able  CARDIOVASCULAR  Lab 01/12/12 2213 01/12/12 0914 01/12/12 0909  TROPONINI <0.30 -- <0.30  LATICACIDVEN -- 1.4 --  PROBNP 158.0 -- --   ECG:  6/5 NSR, LVH, no ST wave changes Lines: peripheral  A: Hypertensive emergency and hypertensive encephalopathy; labile blood pressure with hypotension and confusion (resolved with stopping nicardipine and giving  fluids) P:  -Goal BP ~ highest 230/90, MAP 140 -nicardipine off now -restart home ARB and HCTZ if BP range needed, not currently to tolerate this -MAP goal 75% of highest MAP (25% reduction) = goal 90-110 range -Echo assess AS, etc -carotids -tele -cards evaluation, EP needed?  A: Syncope x 2 (once at home and again 6/5 in house); DDx labile BP (exacerbated by nicardipine vs. Bradycardia).  With tonight's episode of hypotension and AMS she had sinus brady followed by junctional brady (see printed strips from 21:34 in chart) P: -Agree with TTE -check carotid dopplers -consult EP in AM 6/6 to review tele strip  RENAL  Lab 01/13/12 0552 01/12/12 2212 01/12/12 0908  NA 137 139 139  K 3.9 3.1* --  CL 100 100 102  CO2 23 26 26   BUN 13 12 13   CREATININE 1.06 1.02 0.98  CALCIUM 9.7 9.4 9.6  MG -- -- --  PHOS -- -- --   Intake/Output      06/05 0701 - 06/06 0700 06/06 0701 - 06/07 0700   P.O. 50    I.V. (mL/kg) 360 (4.4)    IV Piggyback 400    Total Intake(mL/kg) 810 (9.9)    Urine (mL/kg/hr) 1550 (0.8)    Total Output 1550    Net -740         Urine Occurrence 3 x     Foley:    A:  Hypokalemia P:   -repleted, wnl Lytes in am  GASTROINTESTINAL  Lab 01/13/12 0552 01/12/12 0908  AST  27 29  ALT 14 14  ALKPHOS 102 109  BILITOT 0.8 1.0  PROT 7.0 7.4  ALBUMIN 3.6 4.0    A:  Npo P:   Npo with neurostatus ppi  HEMATOLOGIC  Lab 01/13/12 0552 01/12/12 2212 01/12/12 0908  HGB 13.6 14.2 14.0  HCT 42.2 42.9 42.8  PLT 179 174 165  INR -- -- --  APTT -- -- --   A:  No issues P: NTD  INFECTIOUS  Lab 01/13/12 0552 01/12/12 2212 01/12/12 0915 01/12/12 0908  WBC 4.9 5.2 -- 4.6  PROCALCITON -- -- <0.10 --   Cultures:  Antibiotics:   A:  No issues P:   NTD  ENDOCRINE  Lab 01/13/12 0930 01/12/12 2143 01/12/12 1735  GLUCAP 130* 175* 195*   A:  DM2 (glucose normal at time of syncope tonight) P:   -SSI, CBG  NEUROLOGIC  CT head 6/5- neg acute  A:   Syncope followed by delirium with hypertensive encephalopathy; now with no signs of focal vascular event but sundowning; doubt PRESS as vision intact and no seizures P:   -BP management as above -frequent orientation, reassurance -consider MRI brain if able, unable due to severe agitation now -May need neuro input MAP goals in cvs Versed Haldol if qtc allows  BEST PRACTICE / DISPOSITION Level of Care:  ICU Primary Service:  PCCM Consultants:   Code Status:  Full Diet:  npo DVT Px:  scd GI Px:  n/a Skin Integrity:  normal Social / Family:    Nelda Bucks., M.D. Pulmonary and Critical Care Medicine [facility] Pager: [phone]  01/13/2012, 10:51 AM

## 2012-01-13 NOTE — Progress Notes (Signed)
Subjective: [name] is well known to me. She has a history of hypertension but I have not seen her in the office since August of 2012. She quite likely is off her medications and experiencing hypertensive encephalopathy. Appreciate help from critical care for malignant hypertension  Objective: Weight change:   Intake/Output Summary (Last 24 hours) at 01/13/12 0837 Last data filed at 01/13/12 0700  Gross per 24 hour  Intake    810 ml  Output   1550 ml  Net   -740 ml   Filed Vitals:   01/13/12 0400 01/13/12 0500 01/13/12 0600 01/13/12 0700  BP: 155/65 143/84 135/66 147/59  Pulse: 82 67 70 76  Temp: 97.4 F (36.3 C)     TempSrc: Oral     Resp: 23 14 15 17   Height:      Weight:      SpO2: 96% 95% 95% 96%   Vital signs above are not accurate at the moment. Last blood pressure was 150/112  General Appearance: confused and suspicious. Recognized me but still confused Head: Normocephalic, without obvious abnormality, atraumatic Neck: could not examine secondary to patient noncompliance Extremities: Extremities normal, atraumatic, no cyanosis or edema Skin: Skin color, texture, turgor normal, no rashes or lesions Neuro: alert, agitated, moving all extremities  Lab Results:  [facility] 01/13/12 0552 01/12/12 2212  NA 137 139  K 3.9 3.1*  CL 100 100  CO2 23 26  GLUCOSE 127* 147*  BUN 13 12  CREATININE 1.06 1.02  CALCIUM 9.7 9.4  MG -- --  PHOS -- --    Basename 01/13/12 0552 01/12/12 0908  AST 27 29  ALT 14 14  ALKPHOS 102 109  BILITOT 0.8 1.0  PROT 7.0 7.4  ALBUMIN 3.6 4.0    Basename 01/12/12 0908  LIPASE 33  AMYLASE --    Basename 01/13/12 0552 01/12/12 2212 01/12/12 0908  WBC 4.9 5.2 --  NEUTROABS -- -- 2.5  HGB 13.6 14.2 --  HCT 42.2 42.9 --  MCV 80.5 80.3 --  PLT 179 174 --    Basename 01/12/12 2213 01/12/12 0909  CKTOTAL 545* --  CKMB 8.5* --  CKMBINDEX -- --  TROPONINI <0.30 <0.30   No components found with this basename:  POCBNP:3 No results found for this basename: DDIMER:2 in the last 72 hours No results found for this basename: HGBA1C:2 in the last 72 hours No results found for this basename: CHOL:2,HDL:2,LDLCALC:2,TRIG:2,CHOLHDL:2,LDLDIRECT:2 in the last 72 hours No results found for this basename: TSH,T4TOTAL,FREET3,T3FREE,THYROIDAB in the last 72 hours No results found for this basename: VITAMINB12:2,FOLATE:2,FERRITIN:2,TIBC:2,IRON:2,RETICCTPCT:2 in the last 72 hours  Studies/Results: Dg Chest 2 View  01/12/2012  *RADIOLOGY REPORT*  Clinical Data: Cough.  Syncope.  Fall.  History of diabetes and hypertension.  CHEST - 2 VIEW  Comparison: Portable chest x-ray 12/06/2010 and CT chest 12/05/2003.  Findings: Cardiac silhouette moderately enlarged but stable. Pulmonary venous hypertension without overt edema.  Lungs clear. Bronchovascular markings normal.  Pulmonary vascularity normal.  No pneumothorax.  No pleural effusions.  Mild degenerative changes involving the thoracic spine and generalized osteopenia suspected.  IMPRESSION: Stable cardiomegaly.  Pulmonary venous hypertension without overt edema.  No acute cardiopulmonary disease.  Original Report Authenticated By: Arnell Sieving, M.D.   Ct Head Wo Contrast  01/12/2012  *RADIOLOGY REPORT*  Clinical Data: Altered mental status.  No acute injury.  CT HEAD WITHOUT CONTRAST  Technique:  Contiguous axial images were obtained from the base of the skull through the vertex without  contrast.  Comparison: Head CT 07/09/2006.  Findings: There is extensive chronic periventricular white matter disease which appears stable.  No acute cortical infarct is identified.  There is no evidence of acute intracranial hemorrhage, mass lesion, brain edema or extra-axial fluid collection. Ventricular dilatation does not appear significantly changed.  The visualized paranasal sinuses and mastoid air cells are clear. The calvarium is intact.  IMPRESSION: Stable chronic small vessel ischemic  changes and atrophy.  No acute intracranial findings identified.  Original Report Authenticated By: Gerrianne Scale, M.D.   Dg Chest Port 1 View  01/12/2012  *RADIOLOGY REPORT*  Clinical Data: Altered mental status. Hypotension.  PORTABLE CHEST - 1 VIEW  Comparison: 01/12/2012.  Findings: The cardiac silhouette, mediastinal and hilar contours are within normal limits and stable.  There is mild tortuosity and calcification of the thoracic aorta.  The lungs are clear.  No pleural effusion.  The bony thorax is intact.  IMPRESSION: No acute cardiopulmonary findings.  Original Report Authenticated By: P. Loralie Champagne, M.D.   Medications: Scheduled Meds:   . haloperidol lactate  5 mg Intravenous Once  . insulin aspart  0-9 Units Subcutaneous TID WC  . ondansetron (ZOFRAN) IV  4 mg Intravenous Once  . potassium chloride  10 mEq Intravenous Q1 Hr x 4  . sodium chloride  750 mL Intravenous Once  . DISCONTD: hydrochlorothiazide  12.5 mg Oral Daily  . DISCONTD: irbesartan  150 mg Oral Daily  . DISCONTD: labetalol  10 mg Intravenous Once  . DISCONTD: niCARDipine  5 mg/hr Intravenous Once  . DISCONTD: valsartan-hydrochlorothiazide  1 tablet Oral Daily   Continuous Infusions:   . sodium chloride 10 mL/hr at 01/13/12 0113  . DISCONTD: sodium chloride 75 mL/hr at 01/12/12 0916  . DISCONTD: niCARDipine Stopped (01/12/12 2035)   PRN Meds:.calcium carbonate, haloperidol lactate, hydrALAZINE, nitroGLYCERIN, ondansetron (ZOFRAN) IV, ondansetron, traMADol  Assessment/Plan: Patient Active Problem List  Diagnoses Date Noted  . Malignant hypertensive urgency - as per critical care 01/12/2012  . DM (diabetes mellitus) 01/12/2012  . Cognitive decline 01/12/2012  . Syncope Delirium - treat with Ativan and Haldol. Check head CT when able. Sitter in room. Likely from hypertensive encephalopathy Elevated CPK - check serial cardiac enzymes 01/12/2012     LOS: 1 day   Samantha Bond NEVILL 01/13/2012, 8:37  AM

## 2012-01-14 ENCOUNTER — Encounter (HOSPITAL_COMMUNITY): Payer: Self-pay

## 2012-01-14 DIAGNOSIS — R55 Syncope and collapse: Secondary | ICD-10-CM

## 2012-01-14 DIAGNOSIS — I674 Hypertensive encephalopathy: Secondary | ICD-10-CM | POA: Diagnosis present

## 2012-01-14 LAB — LIPID PANEL
Cholesterol: 232 mg/dL — ABNORMAL HIGH (ref 0–200)
HDL: 84 mg/dL (ref >39)
Triglycerides: 85 mg/dL (ref <150)

## 2012-01-14 LAB — VITAMIN B12: Vitamin B-12: 447 pg/mL (ref 211–911)

## 2012-01-14 LAB — GLUCOSE, CAPILLARY: Glucose-Capillary: 145 mg/dL — ABNORMAL HIGH (ref 70–99)

## 2012-01-14 LAB — TSH: TSH: 1.335 u[IU]/mL (ref 0.350–4.500)

## 2012-01-14 LAB — BASIC METABOLIC PANEL
BUN: 23 mg/dL (ref 6–23)
CO2: 22 mEq/L (ref 19–32)
Calcium: 9.5 mg/dL (ref 8.4–10.5)
Chloride: 102 mEq/L (ref 96–112)
Creatinine, Ser: 1.63 mg/dL — ABNORMAL HIGH (ref 0.50–1.10)
GFR calc Af Amer: 33 mL/min — ABNORMAL LOW (ref >90)
GFR calc non Af Amer: 28 mL/min — ABNORMAL LOW (ref >90)
Glucose, Bld: 114 mg/dL — ABNORMAL HIGH (ref 70–99)
Potassium: 3.6 mEq/L (ref 3.5–5.1)
Sodium: 139 mEq/L (ref 135–145)

## 2012-01-14 MED ORDER — LOSARTAN POTASSIUM 50 MG PO TABS
50.0000 mg | ORAL_TABLET | Freq: Every day | ORAL | Status: DC
  Administered 2012-01-14 – 2012-01-15 (×2): 50 mg via ORAL
  Filled 2012-01-14 (×2): qty 1

## 2012-01-14 MED ORDER — HYDROCHLOROTHIAZIDE 12.5 MG PO CAPS
12.5000 mg | ORAL_CAPSULE | Freq: Every day | ORAL | Status: DC
  Administered 2012-01-14 – 2012-01-15 (×2): 12.5 mg via ORAL
  Filled 2012-01-14 (×2): qty 1

## 2012-01-14 MED ORDER — BIOTENE DRY MOUTH MT LIQD
15.0000 mL | Freq: Two times a day (BID) | OROMUCOSAL | Status: DC
  Administered 2012-01-14 – 2012-01-15 (×3): 15 mL via OROMUCOSAL

## 2012-01-14 MED ORDER — DILTIAZEM HCL ER COATED BEADS 180 MG PO CP24
180.0000 mg | ORAL_CAPSULE | Freq: Every day | ORAL | Status: DC
  Administered 2012-01-14 – 2012-01-15 (×2): 180 mg via ORAL
  Filled 2012-01-14 (×2): qty 1

## 2012-01-14 NOTE — Progress Notes (Signed)
Subjective: Samantha Bond is oriented today.  She is calm.  Combativeness has resolved.  Confusion has essentially resolved.  She knows that she is at [facility]  She does not remember being paranoid and combative yesterday.  Low pressures are controlled.  No complaints.  Objective: Weight change:   Intake/Output Summary (Last 24 hours) at 01/14/12 0803 Last data filed at 01/14/12 0700  Gross per 24 hour  Intake    350 ml  Output    725 ml  Net   -375 ml    Filed Vitals:   01/14/12 0400 01/14/12 0500 01/14/12 0600 01/14/12 0700  BP: 161/75 126/67 132/94 140/74  Pulse: 67 64 79 70  Temp: 97.8 F (36.6 C)     TempSrc: Oral     Resp: 16 16 22 18   Height:      Weight:      SpO2: 95% 94% 96% 94%   General Appearance: Alert, cooperative, no distress, appears stated age Head: Normocephalic, without obvious abnormality, atraumatic Neck: Supple, symmetrical Lungs: Clear to auscultation bilaterally, respirations unlabored Heart: Regular rate and rhythm, S1 and S2 normal, no murmur, rub or gallop Abdomen: Soft, non-tender, bowel sounds active all four quadrants, no masses, no organomegaly Extremities: Extremities normal, atraumatic, no cyanosis or edema Pulses: 2+ and symmetric all extremities Skin: Skin color, texture, turgor normal, no rashes or lesions Neuro: CNII-XII intact. Normal strength, nonfocal  Lab Results:  Basename 01/14/12 0520 01/13/12 0552  NA 139 137  K 3.6 3.9  CL 102 100  CO2 22 23  GLUCOSE 114* 127*  BUN 23 13  CREATININE 1.63* 1.06  CALCIUM 9.5 9.7  MG -- --  PHOS -- --    Basename 01/13/12 0552 01/12/12 0908  AST 27 29  ALT 14 14  ALKPHOS 102 109  BILITOT 0.8 1.0  PROT 7.0 7.4  ALBUMIN 3.6 4.0    Basename 01/12/12 0908  LIPASE 33  AMYLASE --    Basename 01/13/12 0552 01/12/12 2212 01/12/12 0908  WBC 4.9 5.2 --  NEUTROABS -- -- 2.5  HGB 13.6 14.2 --  HCT 42.2 42.9 --  MCV 80.5 80.3 --  PLT 179 174 --    Basename 01/12/12  2213 01/12/12 0909  CKTOTAL 545* --  CKMB 8.5* --  CKMBINDEX -- --  TROPONINI <0.30 <0.30   No components found with this basename: POCBNP:3 No results found for this basename: DDIMER:2 in the last 72 hours No results found for this basename: HGBA1C:2 in the last 72 hours No results found for this basename: CHOL:2,HDL:2,LDLCALC:2,TRIG:2,CHOLHDL:2,LDLDIRECT:2 in the last 72 hours No results found for this basename: TSH,T4TOTAL,FREET3,T3FREE,THYROIDAB in the last 72 hours No results found for this basename: VITAMINB12:2,FOLATE:2,FERRITIN:2,TIBC:2,IRON:2,RETICCTPCT:2 in the last 72 hours  Studies/Results: Dg Chest 2 View  01/12/2012  *RADIOLOGY REPORT*  Clinical Data: Cough.  Syncope.  Fall.  History of diabetes and hypertension.  CHEST - 2 VIEW  Comparison: Portable chest x-ray 12/06/2010 and CT chest 12/05/2003.  Findings: Cardiac silhouette moderately enlarged but stable. Pulmonary venous hypertension without overt edema.  Lungs clear. Bronchovascular markings normal.  Pulmonary vascularity normal.  No pneumothorax.  No pleural effusions.  Mild degenerative changes involving the thoracic spine and generalized osteopenia suspected.  IMPRESSION: Stable cardiomegaly.  Pulmonary venous hypertension without overt edema.  No acute cardiopulmonary disease.  Original Report Authenticated By: Arnell Sieving, M.D.   Ct Head Wo Contrast  01/12/2012  *RADIOLOGY REPORT*  Clinical Data: Altered mental status.  No acute injury.  CT  HEAD WITHOUT CONTRAST  Technique:  Contiguous axial images were obtained from the base of the skull through the vertex without contrast.  Comparison: Head CT 07/09/2006.  Findings: There is extensive chronic periventricular white matter disease which appears stable.  No acute cortical infarct is identified.  There is no evidence of acute intracranial hemorrhage, mass lesion, brain edema or extra-axial fluid collection. Ventricular dilatation does not appear significantly changed.   The visualized paranasal sinuses and mastoid air cells are clear. The calvarium is intact.  IMPRESSION: Stable chronic small vessel ischemic changes and atrophy.  No acute intracranial findings identified.  Original Report Authenticated By: Gerrianne Scale, M.D.   Dg Chest Port 1 View  01/12/2012  *RADIOLOGY REPORT*  Clinical Data: Altered mental status. Hypotension.  PORTABLE CHEST - 1 VIEW  Comparison: 01/12/2012.  Findings: The cardiac silhouette, mediastinal and hilar contours are within normal limits and stable.  There is mild tortuosity and calcification of the thoracic aorta.  The lungs are clear.  No pleural effusion.  The bony thorax is intact.  IMPRESSION: No acute cardiopulmonary findings.  Original Report Authenticated By: P. Loralie Champagne, M.D.   Medications: Scheduled Meds:   . antiseptic oral rinse  15 mL Mouth Rinse BID  . diltiazem  180 mg Oral Daily  . hydrochlorothiazide  12.5 mg Oral Daily  . insulin aspart  0-9 Units Subcutaneous TID WC  . losartan  50 mg Oral Daily  . midazolam      . midazolam       Continuous Infusions:   . sodium chloride 10 mL/hr at 01/13/12 0113   PRN Meds:.calcium carbonate, haloperidol lactate, haloperidol lactate, hydrALAZINE, midazolam, nitroGLYCERIN, ondansetron (ZOFRAN) IV, ondansetron, traMADol, DISCONTD: LORazepam  Assessment/Plan: Patient Active Problem List  Diagnoses Date Noted  . Malignant hypertensive urgency - blood pressure control.  We'll resume diltiazem and low-dose ARB and HCTZ for hypertension  01/12/2012  . DM (diabetes mellitus) - blood sugars have all been under 200.  Mostly low 100s.  Discontinue CBGs and we'll plan to resume diabetic medication as an outpatient if necessary  01/12/2012  . Cognitive decline - patient back to baseline and thinking normally.  Does not recur yesterday well.  She is calm  01/12/2012  . Syncope - possibly medication induced yesterday with IV Cardene Hypertensive encephalopathy - resolving    Disposition  -  transfer patient to telemetry bed with plans for discharge home tomorrow as long as she is clinically stable today and tolerates resumption of her blood pressure medications.  Home health nursing to assure medication compliance and blood pressure control.  Patient needs assistance to leave home   01/12/2012     LOS: 2 days   Amadeus Oyama NEVILL 01/14/2012, 8:03 AM

## 2012-01-14 NOTE — Discharge Instructions (Signed)
[facility] [phone] hhrn, hh phy ther

## 2012-01-14 NOTE — Clinical Social Work Psychosocial (Addendum)
    Clinical Social Work Department BRIEF PSYCHOSOCIAL ASSESSMENT 01/14/2012  Patient:  MAXIMINA, PIROZZI     Account Number:  [id number]     Admit date:  01/12/2012  Clinical Social Worker:  Hulan Fray  Date/Time:  01/14/2012 09:37 AM  Referred by:  Care Management  Date Referred:  01/12/2012 Referred for  Other - See comment   Other Referral:   Support  New NHP   Interview type:  Patient Other interview type:    PSYCHOSOCIAL DATA Living Status:  ALONE Admitted from facility:   Level of care:   Primary support name:  [name] Primary support relationship to patient:  CHILD, ADULT Degree of support available:   supportive    CURRENT CONCERNS Current Concerns  None Noted   Other Concerns:    SOCIAL WORK ASSESSMENT / PLAN Clinical Social Worker received referral from CM regarding patient. CSW spoke with patient and she lives alone and has four children, two girls and two boys. Her daughter, [name] lives in the [place] area. Per patient, her daughter [name] will be staying with her in patient's house. Patient stated that she has family support around her. Per chart review, patient is planned to go back home with home health services.   Assessment/plan status:  No Further Intervention Required Other assessment/ plan:   Information/referral to community resources:   Declined any additional resources    PATIENT'S/FAMILY'S RESPONSE TO PLAN OF CARE: Patient was polite in speaking with CSW. Patient stated that her daughter [name] will be staying with her at discharge.

## 2012-01-14 NOTE — Progress Notes (Signed)
Bilateral carotid artery duplex completed.  Preliminary report is no evidence of significant ICA stenosis. 

## 2012-01-14 NOTE — Care Management Note (Signed)
    Page 1 of 1   01/14/2012     9:35:02 AM   CARE MANAGEMENT NOTE 01/14/2012  Patient:  Samantha Bond, Samantha Bond   Account Number:  [id number]  Date Initiated:  01/13/2012  Documentation initiated by:  Junius Creamer  Subjective/Objective Assessment:   adm w htn     Action/Plan:   lives alone, pcp dr Molly Maduro gates, sleeping on my visit, chart states some confusion   Anticipated DC Date:  01/15/2012   Anticipated DC Plan:  HOME W HOME HEALTH SERVICES  In-house referral  Clinical Social Worker      DC Associate Professor  CM consult      [facility] Choice  HOME HEALTH   Choice offered to / List presented to:  [name]        HH arranged  HH-1 Theatre stage manager PT      [facility] agency  [facility]   Status of service:   Medicare Important Message given?   (If response is "NO", the following Medicare IM given date fields will be blank) Date Medicare IM given:   Date Additional Medicare IM given:    Discharge Disposition:  HOME W HOME HEALTH SERVICES  Per UR Regulation:  Reviewed for med. necessity/level of care/duration of stay  If discussed at Long Length of Stay Meetings, dates discussed:    Comments:  6/7 9:32a debbie Gaege Sangalang rn,bsn [phone] spoke w pt who is not confused today. she lives home alone, has cane but indep without. she has family that checks on her. she plans to dc home in am. gave hhc agency list. [facility] had [facility] but they do not have [facility] contract. pt has no pref to hhc agencies. ref to debbie w [facility] for rn and pt. pt to get up w staff today to be sure she is stable enough for home. copy of hhc agency list on chart also. copy left w pt.  6/6 debbie Shawneequa Baldridge rn,bsn [phone]

## 2012-01-14 NOTE — Progress Notes (Signed)
Patient ID: Samantha Bond, female   DOB: [birth year], 78 y.o.   MRN:    I have carefully reviewed the cardiology consult note from yesterday. Two-dimensional echo reveals  vigorous left ventricular function. Troponins are normal. No further cardiac workup is recommended at this time. There is a very slight intracavitary gradient with vigorous left ventricular function. In this setting it is probably optimal to be sure that the patient does not become volume depleted.

## 2012-01-14 NOTE — Progress Notes (Signed)
HOME HEALTH AGENCIES SERVING [place]   Agencies that are Medicare-Certified and are affiliated with [facility]  Telephone Number Address  [facility]   [facility] has ownership interest in this company; however, you are under no obligation to use this agency. [phone] or  [phone] [address] [place], [place] [address]   Agencies that are Medicare-Certified and are not affiliated with [facility]                                                                                 Home Health Agency Telephone Number Address  [facility] [phone] Fax [phone] [address], [address] [place], [place]  [address]  [facility] [phone] or [phone] Fax [phone] [address] [place], [place] [address]  [facility] [phone] Fax [phone] [address] [place], [place] [address]  [facility] [phone] Fax [phone] [address], [address] [place], [place]  [address]  [facility] [phone] Fax [phone] [address], [address] [place], [place] [address]  [facility] [phone] [address] [place], [place] [address]  [facility] [phone]  [address] [place], [place] [address]  [facility] [phone] or [phone] Fax [phone] [address], [address] , [place]  [address]  [facility] [phone] Fax [phone] [address] [place], [place]  [address]  [facility]  [phone] Fax [phone] [address] [place],  [address]      

## 2012-01-15 LAB — BASIC METABOLIC PANEL
CO2: 22 mEq/L (ref 19–32)
Calcium: 9.5 mg/dL (ref 8.4–10.5)
Chloride: 101 mEq/L (ref 96–112)
Potassium: 3.7 mEq/L (ref 3.5–5.1)
Sodium: 138 mEq/L (ref 135–145)

## 2012-01-15 LAB — GLUCOSE, CAPILLARY: Glucose-Capillary: 136 mg/dL — ABNORMAL HIGH (ref 70–99)

## 2012-01-15 MED ORDER — DILTIAZEM HCL ER COATED BEADS 180 MG PO CP24: 180.0000 mg | ORAL_CAPSULE | Freq: Every day | ORAL | Status: DC

## 2012-01-15 MED ORDER — LOSARTAN POTASSIUM 50 MG PO TABS: 50.0000 mg | ORAL_TABLET | Freq: Every day | ORAL | Status: DC

## 2012-01-15 NOTE — Discharge Summary (Signed)
Physician Discharge Summary  NAME:Samantha Bond  ZOX:[id number]  DOB: 1926/12/25   Admit date: 01/12/2012 Discharge date: 01/15/2012  Discharge Diagnoses:  Active Problems:  Malignant hypertensive urgency - resolved with treating blood pressure.  Patient had gone without blood pressure medicines for quite some time prior to admission  DM (diabetes mellitus) - well controlled  Syncope - likely secondary to intravenous therapy to lower blood pressure  Hypertensive encephalopathy - resolved for blood pressure control   Discharge Condition: Much improved  Hospital Course: Samantha Bond is a well-known patient to me in my outpatient practice.  She has a history of hypertension, type 2 diabetes mellitus, and advanced age.  She apparently has been off of her blood pressure medications for quite some time.  3 days ago she was admitted after 24 hours of feeling very hot and not being able to walk properly.  She actually passed out on her porch after walking outside to try to get the newspaper.  On arrival in the emergency room after being picked up by EMS, her blood pressure was 2:30/90 and she could not read she was taking medicines or not.  She was confused in the ER.  No signs of any seizures. She was admitted and started on intravenous Cardene which lowered blood pressure but caused bradycardia and a syncopal event.  She was very combative after that and confused and paranoid and responded well to Ativan and Haldol.  With control of her blood pressure, her mental status has returned to baseline, calm and oriented.  She does have some mild memory loss of aging chronically but no severe dementia  Consults:   Ulmonary critical care and cardiology  Physical Assessment:  Alert and oriented, conversive  Filed Vitals:   01/14/12 1000 01/14/12 1500 01/14/12 2130 01/15/12 0600  BP: 156/92 139/65 163/93 144/87  Pulse: 86 76 82 74  Temp: 98.6 F (37 C) 98.3 F (36.8 C) 99.3 F (37.4 C)  97.2 F (36.2 C)  TempSrc: Oral Oral    Resp: 17 20 14 14   Height:      Weight:      SpO2: 96% 98% 98% 93%    General Appearance: Alert, cooperative, no distress, appears stated age  Head: Normocephalic, without obvious abnormality, atraumatic  Neck: Supple, symmetrical  Lungs: Clear to auscultation bilaterally, respirations unlabored  Heart: Regular rate and rhythm, S1 and S2 normal, no murmur, rub or gallop  Abdomen: Soft, non-tender, bowel sounds active all four quadrants, no masses, no organomegaly  Extremities: Extremities normal, atraumatic, no cyanosis or edema  Pulses: 2+ and symmetric all extremities  Skin: Skin color, texture, turgor normal, no rashes or lesions  Neuro: CNII-XII intact. Normal strength, nonfocal  Disposition: Discharged home and daughter will be with her over the next few days to ensure she is stable and taking her medications properly  Discharge Orders    Future Orders Please Complete By Expires   Diet - low sodium heart healthy      Increase activity slowly      Discharge instructions      Comments:   We will call you on Monday, June 10, to schedule appointment later in the week.  Please call [phone] this weekend if you are not feeling well, or if he acutely worsen in terms of symptoms of shortness of breath or fever or or pain.  Notify your family to call us  if you happen to become confused later this weekend or early next week  Call MD for:  temperature >100.4      Call MD for:  severe uncontrolled pain      Call MD for:  difficulty breathing, headache or visual disturbances      Call MD for:  persistant dizziness or light-headedness        Medication List  As of 01/15/2012  9:55 AM   STOP taking these medications         calcium carbonate 500 MG chewable tablet      valsartan-hydrochlorothiazide 160-12.5 MG per tablet         TAKE these medications         diltiazem 180 MG 24 hr capsule   Commonly known as: CARDIZEM CD   Take 1 capsule  (180 mg total) by mouth daily.      glimepiride 2 MG tablet   Commonly known as: AMARYL   Take 2 mg by mouth daily.      losartan 50 MG tablet   Commonly known as: COZAAR   Take 1 tablet (50 mg total) by mouth daily.      traMADol 50 MG tablet   Commonly known as: ULTRAM   Take 50 mg by mouth 3 (three) times daily as needed. For pain.             Things to follow up in the outpatient setting: Will follow up in the office later this week.  If confusion or chest pain or shortness of breath recurs, please call doctor  Time coordinating discharge: 40 minutes  The results of significant diagnostics from this hospitalization (including imaging, microbiology, ancillary and laboratory) are listed below for reference.    Significant Diagnostic Studies: Dg Chest 2 View  01/12/2012  *RADIOLOGY REPORT*  Clinical Data: Cough.  Syncope.  Fall.  History of diabetes and hypertension.  CHEST - 2 VIEW  Comparison: Portable chest x-ray 12/06/2010 and CT chest 12/05/2003.  Findings: Cardiac silhouette moderately enlarged but stable. Pulmonary venous hypertension without overt edema.  Lungs clear. Bronchovascular markings normal.  Pulmonary vascularity normal.  No pneumothorax.  No pleural effusions.  Mild degenerative changes involving the thoracic spine and generalized osteopenia suspected.  IMPRESSION: Stable cardiomegaly.  Pulmonary venous hypertension without overt edema.  No acute cardiopulmonary disease.  Original Report Authenticated By: Arnell Sieving, M.D.   Ct Head Wo Contrast  01/12/2012  *RADIOLOGY REPORT*  Clinical Data: Altered mental status.  No acute injury.  CT HEAD WITHOUT CONTRAST  Technique:  Contiguous axial images were obtained from the base of the skull through the vertex without contrast.  Comparison: Head CT 07/09/2006.  Findings: There is extensive chronic periventricular white matter disease which appears stable.  No acute cortical infarct is identified.  There is no evidence  of acute intracranial hemorrhage, mass lesion, brain edema or extra-axial fluid collection. Ventricular dilatation does not appear significantly changed.  The visualized paranasal sinuses and mastoid air cells are clear. The calvarium is intact.  IMPRESSION: Stable chronic small vessel ischemic changes and atrophy.  No acute intracranial findings identified.  Original Report Authenticated By: Gerrianne Scale, M.D.   Dg Chest Port 1 View  01/12/2012  *RADIOLOGY REPORT*  Clinical Data: Altered mental status. Hypotension.  PORTABLE CHEST - 1 VIEW  Comparison: 01/12/2012.  Findings: The cardiac silhouette, mediastinal and hilar contours are within normal limits and stable.  There is mild tortuosity and calcification of the thoracic aorta.  The lungs are clear.  No pleural effusion.  The bony thorax is intact.  IMPRESSION: No acute cardiopulmonary findings.  Original Report Authenticated By: P. Loralie Champagne, M.D.    Microbiology: Recent Results (from the past 240 hour(s))  URINE CULTURE     Status: Normal   Collection Time   01/12/12 10:18 AM      Component Value Range Status Comment   Specimen Description URINE, RANDOM   Final    Special Requests NONE   Final    Culture  Setup Time [id number]   Final    Colony Count 20,OOO COLONIES/ML   Final    Culture     Final    Value: Multiple bacterial morphotypes present, none predominant. Suggest appropriate recollection if clinically indicated.   Report Status 01/13/2012 FINAL   Final   MRSA PCR SCREENING     Status: Normal   Collection Time   01/12/12  2:37 PM      Component Value Range Status Comment   MRSA by PCR NEGATIVE  NEGATIVE  Final      Labs: Results for orders placed during the hospital encounter of 01/12/12  CBC      Component Value Range   WBC 4.6  4.0 - 10.5 (K/uL)   RBC 5.30 (*) 3.87 - 5.11 (MIL/uL)   Hemoglobin 14.0  12.0 - 15.0 (g/dL)   HCT 84.1  32.4 - 40.1 (%)   MCV 80.8  78.0 - 100.0 (fL)   MCH 26.4  26.0 - 34.0 (pg)    MCHC 32.7  30.0 - 36.0 (g/dL)   RDW 02.7  25.3 - 66.4 (%)   Platelets 165  150 - 400 (K/uL)  DIFFERENTIAL      Component Value Range   Neutrophils Relative 55  43 - 77 (%)   Neutro Abs 2.5  1.7 - 7.7 (K/uL)   Lymphocytes Relative 34  12 - 46 (%)   Lymphs Abs 1.5  0.7 - 4.0 (K/uL)   Monocytes Relative 8  3 - 12 (%)   Monocytes Absolute 0.4  0.1 - 1.0 (K/uL)   Eosinophils Relative 2  0 - 5 (%)   Eosinophils Absolute 0.1  0.0 - 0.7 (K/uL)   Basophils Relative 0  0 - 1 (%)   Basophils Absolute 0.0  0.0 - 0.1 (K/uL)  COMPREHENSIVE METABOLIC PANEL      Component Value Range   Sodium 139  135 - 145 (mEq/L)   Potassium 3.5  3.5 - 5.1 (mEq/L)   Chloride 102  96 - 112 (mEq/L)   CO2 26  19 - 32 (mEq/L)   Glucose, Bld 143 (*) 70 - 99 (mg/dL)   BUN 13  6 - 23 (mg/dL)   Creatinine, Ser 4.03  0.50 - 1.10 (mg/dL)   Calcium 9.6  8.4 - 47.4 (mg/dL)   Total Protein 7.4  6.0 - 8.3 (g/dL)   Albumin 4.0  3.5 - 5.2 (g/dL)   AST 29  0 - 37 (U/L)   ALT 14  0 - 35 (U/L)   Alkaline Phosphatase 109  39 - 117 (U/L)   Total Bilirubin 1.0  0.3 - 1.2 (mg/dL)   GFR calc non Af Amer 52 (*) >90 (mL/min)   GFR calc Af Amer 60 (*) >90 (mL/min)  LIPASE, BLOOD      Component Value Range   Lipase 33  11 - 59 (U/L)  LACTIC ACID, PLASMA      Component Value Range   Lactic Acid, Venous 1.4  0.5 - 2.2 (mmol/L)  PROCALCITONIN      Component  Value Range   Procalcitonin <0.10    TROPONIN I      Component Value Range   Troponin I <0.30  <0.30 (ng/mL)  URINALYSIS, ROUTINE W REFLEX MICROSCOPIC      Component Value Range   Color, Urine YELLOW  YELLOW    APPearance CLEAR  CLEAR    Specific Gravity, Urine 1.013  1.005 - 1.030    pH 7.5  5.0 - 8.0    Glucose, UA NEGATIVE  NEGATIVE (mg/dL)   Hgb urine dipstick NEGATIVE  NEGATIVE    Bilirubin Urine NEGATIVE  NEGATIVE    Ketones, ur NEGATIVE  NEGATIVE (mg/dL)   Protein, ur NEGATIVE  NEGATIVE (mg/dL)   Urobilinogen, UA 1.0  0.0 - 1.0 (mg/dL)   Nitrite NEGATIVE   NEGATIVE    Leukocytes, UA NEGATIVE  NEGATIVE   URINE CULTURE      Component Value Range   Specimen Description URINE, RANDOM     Special Requests NONE     Culture  Setup Time [id number]     Colony Count 20,OOO COLONIES/ML     Culture       Value: Multiple bacterial morphotypes present, none predominant. Suggest appropriate recollection if clinically indicated.   Report Status 01/13/2012 FINAL    MRSA PCR SCREENING      Component Value Range   MRSA by PCR NEGATIVE  NEGATIVE   CBC      Component Value Range   WBC 4.9  4.0 - 10.5 (K/uL)   RBC 5.24 (*) 3.87 - 5.11 (MIL/uL)   Hemoglobin 13.6  12.0 - 15.0 (g/dL)   HCT 03.4  74.2 - 59.5 (%)   MCV 80.5  78.0 - 100.0 (fL)   MCH 26.0  26.0 - 34.0 (pg)   MCHC 32.2  30.0 - 36.0 (g/dL)   RDW 63.8  75.6 - 43.3 (%)   Platelets 179  150 - 400 (K/uL)  COMPREHENSIVE METABOLIC PANEL      Component Value Range   Sodium 137  135 - 145 (mEq/L)   Potassium 3.9  3.5 - 5.1 (mEq/L)   Chloride 100  96 - 112 (mEq/L)   CO2 23  19 - 32 (mEq/L)   Glucose, Bld 127 (*) 70 - 99 (mg/dL)   BUN 13  6 - 23 (mg/dL)   Creatinine, Ser 2.95  0.50 - 1.10 (mg/dL)   Calcium 9.7  8.4 - 18.8 (mg/dL)   Total Protein 7.0  6.0 - 8.3 (g/dL)   Albumin 3.6  3.5 - 5.2 (g/dL)   AST 27  0 - 37 (U/L)   ALT 14  0 - 35 (U/L)   Alkaline Phosphatase 102  39 - 117 (U/L)   Total Bilirubin 0.8  0.3 - 1.2 (mg/dL)   GFR calc non Af Amer 47 (*) >90 (mL/min)   GFR calc Af Amer 55 (*) >90 (mL/min)  GLUCOSE, CAPILLARY      Component Value Range   Glucose-Capillary 195 (*) 70 - 99 (mg/dL)  GLUCOSE, CAPILLARY      Component Value Range   Glucose-Capillary 175 (*) 70 - 99 (mg/dL)  BASIC METABOLIC PANEL      Component Value Range   Sodium 139  135 - 145 (mEq/L)   Potassium 3.1 (*) 3.5 - 5.1 (mEq/L)   Chloride 100  96 - 112 (mEq/L)   CO2 26  19 - 32 (mEq/L)   Glucose, Bld 147 (*) 70 - 99 (mg/dL)   BUN 12  6 - 23 (mg/dL)  Creatinine, Ser 1.02  0.50 - 1.10 (mg/dL)   Calcium 9.4   8.4 - 10.5 (mg/dL)   GFR calc non Af Amer 49 (*) >90 (mL/min)   GFR calc Af Amer 57 (*) >90 (mL/min)  CBC      Component Value Range   WBC 5.2  4.0 - 10.5 (K/uL)   RBC 5.34 (*) 3.87 - 5.11 (MIL/uL)   Hemoglobin 14.2  12.0 - 15.0 (g/dL)   HCT 96.0  45.4 - 09.8 (%)   MCV 80.3  78.0 - 100.0 (fL)   MCH 26.6  26.0 - 34.0 (pg)   MCHC 33.1  30.0 - 36.0 (g/dL)   RDW 11.9  14.7 - 82.9 (%)   Platelets 174  150 - 400 (K/uL)  CARDIAC PANEL(CRET KIN+CKTOT+MB+TROPI)      Component Value Range   Total CK 545 (*) 7 - 177 (U/L)   CK, MB 8.5 (*) 0.3 - 4.0 (ng/mL)   Troponin I <0.30  <0.30 (ng/mL)   Relative Index 1.6  0.0 - 2.5   PRO B NATRIURETIC PEPTIDE      Component Value Range   Pro B Natriuretic peptide (BNP) 158.0  0 - 450 (pg/mL)  GLUCOSE, CAPILLARY      Component Value Range   Glucose-Capillary 130 (*) 70 - 99 (mg/dL)  GLUCOSE, CAPILLARY      Component Value Range   Glucose-Capillary 119 (*) 70 - 99 (mg/dL)  BASIC METABOLIC PANEL      Component Value Range   Sodium 139  135 - 145 (mEq/L)   Potassium 3.6  3.5 - 5.1 (mEq/L)   Chloride 102  96 - 112 (mEq/L)   CO2 22  19 - 32 (mEq/L)   Glucose, Bld 114 (*) 70 - 99 (mg/dL)   BUN 23  6 - 23 (mg/dL)   Creatinine, Ser 5.62 (*) 0.50 - 1.10 (mg/dL)   Calcium 9.5  8.4 - 13.0 (mg/dL)   GFR calc non Af Amer 28 (*) >90 (mL/min)   GFR calc Af Amer 33 (*) >90 (mL/min)  GLUCOSE, CAPILLARY      Component Value Range   Glucose-Capillary 153 (*) 70 - 99 (mg/dL)  GLUCOSE, CAPILLARY      Component Value Range   Glucose-Capillary 107 (*) 70 - 99 (mg/dL)  TSH      Component Value Range   TSH 1.335  0.350 - 4.500 (uIU/mL)  VITAMIN B12      Component Value Range   Vitamin B-12 447  211 - 911 (pg/mL)  LIPID PANEL      Component Value Range   Cholesterol 232 (*) 0 - 200 (mg/dL)   Triglycerides 85  <865 (mg/dL)   HDL 84  >78 (mg/dL)   Total CHOL/HDL Ratio 2.8     VLDL 17  0 - 40 (mg/dL)   LDL Cholesterol 469 (*) 0 - 99 (mg/dL)  GLUCOSE,  CAPILLARY      Component Value Range   Glucose-Capillary 111 (*) 70 - 99 (mg/dL)  GLUCOSE, CAPILLARY      Component Value Range   Glucose-Capillary 199 (*) 70 - 99 (mg/dL)  GLUCOSE, CAPILLARY      Component Value Range   Glucose-Capillary 124 (*) 70 - 99 (mg/dL)   Comment 1 Notify RN    BASIC METABOLIC PANEL      Component Value Range   Sodium 138  135 - 145 (mEq/L)   Potassium 3.7  3.5 - 5.1 (mEq/L)   Chloride 101  96 -  112 (mEq/L)   CO2 22  19 - 32 (mEq/L)   Glucose, Bld 136 (*) 70 - 99 (mg/dL)   BUN 27 (*) 6 - 23 (mg/dL)   Creatinine, Ser 1.61 (*) 0.50 - 1.10 (mg/dL)   Calcium 9.5  8.4 - 09.6 (mg/dL)   GFR calc non Af Amer 35 (*) >90 (mL/min)   GFR calc Af Amer 41 (*) >90 (mL/min)  GLUCOSE, CAPILLARY      Component Value Range   Glucose-Capillary 145 (*) 70 - 99 (mg/dL)  GLUCOSE, CAPILLARY      Component Value Range   Glucose-Capillary 136 (*) 70 - 99 (mg/dL)   Comment 1 Notify RN       Signed: Joshoa Shawler NEVILL 01/15/2012, 9:55 AM

## 2012-01-15 NOTE — Progress Notes (Signed)
   CARE MANAGEMENT NOTE 01/15/2012  Patient:  Samantha Bond, Samantha Bond   Account Number:  [id number]  Date Initiated:  01/13/2012  Documentation initiated by:  Junius Creamer  Subjective/Objective Assessment:   adm w htn     Action/Plan:   lives alone, pcp dr Molly Maduro gates, sleeping on my visit, chart states some confusion   Anticipated DC Date:  01/15/2012   Anticipated DC Plan:  HOME W HOME HEALTH SERVICES  In-house referral  Clinical Social Worker      DC Associate Professor  CM consult      [facility] Choice  HOME HEALTH   Choice offered to / List presented to:  [name]        HH arranged  HH-1 RN  HH-2 PT      [facility] agency  [facility]   Status of service:  Completed, signed off Medicare Important Message given?   (If response is "NO", the following Medicare IM given date fields will be blank) Date Medicare IM given:   Date Additional Medicare IM given:    Discharge Disposition:  HOME W HOME HEALTH SERVICES  Per UR Regulation:  Reviewed for med. necessity/level of care/duration of stay  If discussed at Long Length of Stay Meetings, dates discussed:    Comments:  01/15/2012 1145 Contacted [facility] to make aware of pt's scheduled d/c today home with [facility] PT/RN. Isidoro Donning RN CCM Case Mgmt phone [phone]  6/7 9:32a debbie dowell rn,bsn [phone] spoke w pt who is not confused today. she lives home alone, has cane but indep without. she has family that checks on her. she plans to dc home in am. gave [facility] agency list. [facility] but [facility] do not have [facility] contract. pt has no pref to [facility] agencies. ref to debbie w [facility] for rn and pt. pt to get up w staff today to be sure she is stable enough for home. copy of [facility] agency list on chart also. copy left w pt.  6/6 debbie dowell rn,bsn [phone]

## 2013-08-09 ENCOUNTER — Emergency Department (HOSPITAL_COMMUNITY): Payer: Medicare Other

## 2013-08-09 ENCOUNTER — Emergency Department (HOSPITAL_COMMUNITY)
Admission: EM | Admit: 2013-08-09 | Discharge: 2013-08-10 | Discharge status: Against Medical Advice | Payer: Medicare Other | Attending: Emergency Medicine | Admitting: Emergency Medicine

## 2013-08-09 ENCOUNTER — Encounter (HOSPITAL_COMMUNITY): Payer: Self-pay | Admitting: Emergency Medicine

## 2013-08-09 DIAGNOSIS — R059 Cough, unspecified: Secondary | ICD-10-CM | POA: Insufficient documentation

## 2013-08-09 DIAGNOSIS — R05 Cough: Secondary | ICD-10-CM | POA: Insufficient documentation

## 2013-08-09 DIAGNOSIS — I10 Essential (primary) hypertension: Secondary | ICD-10-CM | POA: Insufficient documentation

## 2013-08-09 DIAGNOSIS — E119 Type 2 diabetes mellitus without complications: Secondary | ICD-10-CM | POA: Insufficient documentation

## 2013-08-09 DIAGNOSIS — Z79899 Other long term (current) drug therapy: Secondary | ICD-10-CM | POA: Insufficient documentation

## 2013-08-09 DIAGNOSIS — R1110 Vomiting, unspecified: Secondary | ICD-10-CM

## 2013-08-09 DIAGNOSIS — R55 Syncope and collapse: Secondary | ICD-10-CM | POA: Insufficient documentation

## 2013-08-09 DIAGNOSIS — Z8719 Personal history of other diseases of the digestive system: Secondary | ICD-10-CM | POA: Insufficient documentation

## 2013-08-09 DIAGNOSIS — IMO0002 Reserved for concepts with insufficient information to code with codable children: Secondary | ICD-10-CM | POA: Insufficient documentation

## 2013-08-09 DIAGNOSIS — Z8669 Personal history of other diseases of the nervous system and sense organs: Secondary | ICD-10-CM | POA: Insufficient documentation

## 2013-08-09 DIAGNOSIS — I498 Other specified cardiac arrhythmias: Secondary | ICD-10-CM | POA: Insufficient documentation

## 2013-08-09 DIAGNOSIS — R569 Unspecified convulsions: Secondary | ICD-10-CM | POA: Insufficient documentation

## 2013-08-09 DIAGNOSIS — R112 Nausea with vomiting, unspecified: Secondary | ICD-10-CM | POA: Insufficient documentation

## 2013-08-09 LAB — CBC
HEMATOCRIT: 41 % (ref 36.0–46.0)
HEMOGLOBIN: 13.4 g/dL (ref 12.0–15.0)
MCH: 27 pg (ref 26.0–34.0)
MCHC: 32.7 g/dL (ref 30.0–36.0)
MCV: 82.5 fL (ref 78.0–100.0)
Platelets: 184 10*3/uL (ref 150–400)
RBC: 4.97 MIL/uL (ref 3.87–5.11)
RDW: 14 % (ref 11.5–15.5)
WBC: 3.9 10*3/uL — AB (ref 4.0–10.5)

## 2013-08-09 LAB — URINALYSIS, ROUTINE W REFLEX MICROSCOPIC
GLUCOSE, UA: NEGATIVE mg/dL
HGB URINE DIPSTICK: NEGATIVE
Ketones, ur: 15 mg/dL — AB
Leukocytes, UA: NEGATIVE
Nitrite: NEGATIVE
PH: 5.5 (ref 5.0–8.0)
Protein, ur: 30 mg/dL — AB
SPECIFIC GRAVITY, URINE: 1.025 (ref 1.005–1.030)
Urobilinogen, UA: 1 mg/dL (ref 0.0–1.0)

## 2013-08-09 LAB — BASIC METABOLIC PANEL
BUN: 31 mg/dL — ABNORMAL HIGH (ref 6–23)
CHLORIDE: 103 meq/L (ref 96–112)
CO2: 27 mEq/L (ref 19–32)
Calcium: 9.1 mg/dL (ref 8.4–10.5)
Creatinine, Ser: 1.5 mg/dL — ABNORMAL HIGH (ref 0.50–1.10)
GFR, EST AFRICAN AMERICAN: 36 mL/min — AB (ref >90)
GFR, EST NON AFRICAN AMERICAN: 31 mL/min — AB (ref >90)
Glucose, Bld: 168 mg/dL — ABNORMAL HIGH (ref 70–99)
POTASSIUM: 4 meq/L (ref 3.7–5.3)
SODIUM: 143 meq/L (ref 137–147)

## 2013-08-09 LAB — POCT I-STAT, CHEM 8
BUN: 32 mg/dL — AB (ref 6–23)
CALCIUM ION: 1.15 mmol/L (ref 1.13–1.30)
CHLORIDE: 102 meq/L (ref 96–112)
Creatinine, Ser: 1.6 mg/dL — ABNORMAL HIGH (ref 0.50–1.10)
Glucose, Bld: 173 mg/dL — ABNORMAL HIGH (ref 70–99)
HEMATOCRIT: 44 % (ref 36.0–46.0)
Hemoglobin: 15 g/dL (ref 12.0–15.0)
Potassium: 3.7 mEq/L (ref 3.7–5.3)
SODIUM: 141 meq/L (ref 137–147)
TCO2: 27 mmol/L (ref 0–100)

## 2013-08-09 LAB — URINE MICROSCOPIC-ADD ON

## 2013-08-09 LAB — GLUCOSE, CAPILLARY: GLUCOSE-CAPILLARY: 128 mg/dL — AB (ref 70–99)

## 2013-08-09 MED ORDER — LORAZEPAM 2 MG/ML IJ SOLN: 1.0000 mg | Freq: Once | INTRAMUSCULAR | Status: DC

## 2013-08-09 NOTE — ED Notes (Signed)
Patient transported to XR. 

## 2013-08-09 NOTE — ED Notes (Signed)
Patient transported to X-ray 

## 2013-08-09 NOTE — ED Notes (Signed)
Patient taken to CT.

## 2013-08-09 NOTE — ED Notes (Signed)
ED EKG Completed at 19:55.

## 2013-08-09 NOTE — ED Notes (Signed)
Pt., with family at bedside, desires to leave AMA; Dr. Cline CoolsBridgolf at bedside explaining the risks and benefits of leaving AMA.

## 2013-08-09 NOTE — ED Notes (Signed)
Family: Syncope, found on floor, x 1 emesis, and incontinent of urine, and disoriented. Pt. Went to chair and fell asleep; upon ems arrival: pt still disoriented, . Found on floor. X 1 emesis. Incontinent of urine. Disoriented via ems. Fall asleep in chair.  Initial BP 110/70, cbg 142, hr 68; then proceed to have a second seizure:  According to EMS:  rt. Arm twitching, nystagmus , and then became lethargic, Last bp 148/70, HR 50 sao2 94/95% RA.  On oxygen 2 l. 98%.  Currently, lethargic, agitated. piv 20 ga. Rt. Hand.  No hx. Of seizure. Family did not want her to come to hospital.

## 2013-08-09 NOTE — ED Notes (Signed)
CBG completed = 128

## 2013-08-09 NOTE — ED Provider Notes (Signed)
CSN: [id number]     Arrival date & time 08/09/13  1943 History   First MD Initiated Contact with Patient 08/09/13 2003     Chief Complaint  Patient presents with  . Seizures  . Bradycardia   HPI  80 y/o female with history as noted below who presents after apparent near syncopal episode at home. The patient's family is at the bedside who helped provide additional history. The patient's family states she has been in her usual state of health the last several days other than a productive cough. The patient was found on floor conscious by family after vomiting. Per report the patient was incontinent of urine and disoriented. The patient's daughter states she was not completely unconscious. They state they helped her up to a chair where she fell asleep. They called EMS. Nursing notes reviewed. EMS reported right arm twitching and nystagmus. Vital signs were stable. The patient has no history of seizures. The patient during my exam denied complaints and states she doesn't want to be in the hospital.   Past Medical History  Diagnosis Date  . Hypertension   . Diabetes mellitus   . DJD (degenerative joint disease)   . Peripheral neuropathy   . Mild aortic sclerosis   . Spinal stenosis   . Diverticulosis   . DJD (degenerative joint disease)     knees, back & cervical   Past Surgical History  Procedure Laterality Date  . Cholecystectomy     No family history on file. History  Substance Use Topics  . Smoking status: Never Smoker   . Smokeless tobacco: Not on file  . Alcohol Use: No   OB History   Grav Para Term Preterm Abortions TAB SAB Ect Mult Living                 Review of Systems  Constitutional: Negative for fever and chills.  Respiratory: Positive for cough. Negative for shortness of breath.   Cardiovascular: Negative for chest pain.  Gastrointestinal: Positive for nausea and vomiting. Negative for abdominal pain.  Genitourinary: Negative for dysuria and frequency.   Neurological: Positive for syncope. Negative for weakness, numbness and headaches.  All other systems reviewed and are negative.   Allergies  Benicar; Lyrica; Metformin and related; Toprol xl; and Vicodin  Home Medications   Current Outpatient Rx  Name  Route  Sig  Dispense  Refill  . diltiazem (CARDIZEM CD) 240 MG 24 hr capsule   Oral   Take 240 mg by mouth daily.         Marland Kitchen. glimepiride (AMARYL) 2 MG tablet   Oral   Take 2 mg by mouth daily.         Marland Kitchen. losartan (COZAAR) 50 MG tablet   Oral   Take 50 mg by mouth daily.         . naproxen sodium (ANAPROX) 220 MG tablet   Oral   Take 220-440 mg by mouth 2 (two) times daily as needed (for pain).          BP 144/65  Pulse 63  Temp(Src) 97.6 F (36.4 C) (Oral)  Resp 13  Ht 5\' 6"  (1.676 m)  Wt 180 lb (81.647 kg)  BMI 29.07 kg/m2  SpO2 100% Physical Exam  Nursing note and vitals reviewed. Constitutional: She is oriented to person, place, and time. She appears well-developed and well-nourished. No distress.  HENT:  Head: Normocephalic and atraumatic.  Eyes: Conjunctivae are normal. Pupils are equal, round, and reactive to  light.  Neck: Normal range of motion. Neck supple.  Cardiovascular: Normal rate and regular rhythm.  Exam reveals no gallop and no friction rub.   No murmur heard. Pulmonary/Chest: Effort normal and breath sounds normal.  Abdominal: Soft. She exhibits no distension. There is no tenderness.  Musculoskeletal: Normal range of motion. She exhibits no edema and no tenderness.       Cervical back: She exhibits no bony tenderness.       Thoracic back: She exhibits no bony tenderness.       Lumbar back: She exhibits no bony tenderness.  Neurological: She is alert and oriented to person, place, and time. She has normal strength and normal reflexes. No cranial nerve deficit or sensory deficit.  Skin: Skin is warm and dry.  Psychiatric: She has a normal mood and affect.    ED Course  Procedures  (including critical care time) Labs Review Labs Reviewed  BASIC METABOLIC PANEL - Abnormal; Notable for the following:    Glucose, Bld 168 (*)    BUN 31 (*)    Creatinine, Ser 1.50 (*)    GFR calc non Af Amer 31 (*)    GFR calc Af Amer 36 (*)    All other components within normal limits  CBC - Abnormal; Notable for the following:    WBC 3.9 (*)    All other components within normal limits  URINALYSIS, ROUTINE W REFLEX MICROSCOPIC - Abnormal; Notable for the following:    APPearance CLOUDY (*)    Bilirubin Urine SMALL (*)    Ketones, ur 15 (*)    Protein, ur 30 (*)    All other components within normal limits  GLUCOSE, CAPILLARY - Abnormal; Notable for the following:    Glucose-Capillary 128 (*)    All other components within normal limits  URINE MICROSCOPIC-ADD ON - Abnormal; Notable for the following:    Casts HYALINE CASTS (*)    Crystals CA OXALATE CRYSTALS (*)    All other components within normal limits  POCT I-STAT, CHEM 8 - Abnormal; Notable for the following:    BUN 32 (*)    Creatinine, Ser 1.60 (*)    Glucose, Bld 173 (*)    All other components within normal limits   Imaging Review Dg Chest 2 View  08/09/2013   CLINICAL DATA:  Seizures and bradycardia.  EXAM: CHEST  2 VIEW  COMPARISON:  01/12/2012  FINDINGS: Shallow inspiration. Mild cardiac enlargement with normal pulmonary vascularity. Right hilum is prominent but stable since previous study. Probable esophageal hiatal hernia behind the heart. No focal airspace disease or consolidation. No blunting of costophrenic angles. No pneumothorax. Degenerative changes in the spine.  IMPRESSION: No active cardiopulmonary disease.   Electronically Signed   By: Burman Nieves M.D.   On: 08/09/2013 21:56   Ct Head Wo Contrast   (if New Onset Seizure And/or Head Trauma)  08/09/2013   CLINICAL DATA:  Bradycardia.  Seizure.  EXAM: CT HEAD WITHOUT CONTRAST  TECHNIQUE: Contiguous axial images were obtained from the base of the skull  through the vertex without intravenous contrast.  COMPARISON:  01/12/2012  FINDINGS: Faint calcification of the globus pallidus nuclei, likely physiologic. Otherwise, The brainstem, cerebellum, cerebral peduncles, thalamus, basal ganglia, basilar cisterns, and ventricular system appear within normal limits. Periventricular white matter and corona radiata hypodensities favor chronic ischemic microvascular white matter disease. No intracranial hemorrhage, mass lesion, or acute CVA.  Trace fluid in the right maxillary sinus. Atherosclerotic calcification of the carotid siphons  noted.  IMPRESSION: 1. No acute intracranial findings. 2. Periventricular white matter and corona radiata hypodensities favor chronic ischemic microvascular white matter disease. 3. Small amount of fluid in the right maxillary sinus compatible with mild acute right maxillary sinusitis.   Electronically Signed   By: Herbie Baltimore M.D.   On: 08/09/2013 21:08    Date: 08/09/2013  Rate: 56  Rhythm: sinus bradycardia  QRS Axis: normal  Intervals: normal  ST/T Wave abnormalities: normal  Conduction Disutrbances:none  Narrative Interpretation:   Old EKG Reviewed: unchanged   MDM   Here after apparent syncopal episode. No history of seizure. No post-ictal state. Family witnessed no seizure activity. Symptoms were more c/w syncope . She is afebrile. VSS. Well appearing. Denies complaints. EKG without acute ischemic changes. Bradycardic but c/w previous EKG's. CXR not c/w pneumonia. CBC and BMP both c/w baseline. UA not c/w UTI. Doubt ACS. Recommended admission to which the patient declined. The patient had competency to make the decision to leave. I encouraged the patient to stay for overnight observation. Despite this the patient requested to leave. The patient's son and daughter were present for this conversation and stated they were comfortable with her leaving since that was her decision. The patient was instructed to follow up with  her pcp in 1 day. Return precautions given and discussed with the patient who was in agreement with the plan.     1. Syncope   2. Vomiting   3. Cough       Shanon Ace, MD 08/10/13 0003

## 2013-08-10 NOTE — ED Provider Notes (Addendum)
I saw and evaluated the patient, reviewed the resident's note and I agree with the findings and plan.   .Face to face Exam:  General:  Awake HEENT:  Atraumatic Resp:  Normal effort Abd:  Nondistended Neuro:No focal weakness Psych:  Adequate capacity to make decisions   I reviewed and interpreted the EKG with resident.   Nelia Shiobert L Maymie Brunke, MD 08/30/13 [phone]

## 2013-11-06 ENCOUNTER — Encounter (HOSPITAL_COMMUNITY): Payer: Self-pay | Admitting: Emergency Medicine

## 2013-11-06 ENCOUNTER — Emergency Department (HOSPITAL_COMMUNITY): Payer: Medicare Other

## 2013-11-06 ENCOUNTER — Inpatient Hospital Stay (HOSPITAL_COMMUNITY)
Admission: EM | Admit: 2013-11-06 | Discharge: 2013-11-16 | DRG: 683 | Disposition: A | Discharge status: Skilled Nursing Facility | Payer: Medicare Other | Attending: Internal Medicine | Admitting: Internal Medicine

## 2013-11-06 DIAGNOSIS — E119 Type 2 diabetes mellitus without complications: Secondary | ICD-10-CM | POA: Diagnosis present

## 2013-11-06 DIAGNOSIS — R4182 Altered mental status, unspecified: Secondary | ICD-10-CM | POA: Diagnosis present

## 2013-11-06 DIAGNOSIS — K920 Hematemesis: Secondary | ICD-10-CM | POA: Diagnosis present

## 2013-11-06 DIAGNOSIS — K2100 Gastro-esophageal reflux disease with esophagitis, without bleeding: Secondary | ICD-10-CM | POA: Diagnosis present

## 2013-11-06 DIAGNOSIS — N183 Chronic kidney disease, stage 3 unspecified: Secondary | ICD-10-CM | POA: Diagnosis present

## 2013-11-06 DIAGNOSIS — R4189 Other symptoms and signs involving cognitive functions and awareness: Secondary | ICD-10-CM | POA: Diagnosis present

## 2013-11-06 DIAGNOSIS — R32 Unspecified urinary incontinence: Secondary | ICD-10-CM | POA: Diagnosis not present

## 2013-11-06 DIAGNOSIS — K449 Diaphragmatic hernia without obstruction or gangrene: Secondary | ICD-10-CM | POA: Diagnosis present

## 2013-11-06 DIAGNOSIS — I10 Essential (primary) hypertension: Secondary | ICD-10-CM | POA: Diagnosis present

## 2013-11-06 DIAGNOSIS — F0390 Unspecified dementia without behavioral disturbance: Secondary | ICD-10-CM | POA: Diagnosis present

## 2013-11-06 DIAGNOSIS — Z79899 Other long term (current) drug therapy: Secondary | ICD-10-CM

## 2013-11-06 DIAGNOSIS — I129 Hypertensive chronic kidney disease with stage 1 through stage 4 chronic kidney disease, or unspecified chronic kidney disease: Principal | ICD-10-CM | POA: Diagnosis present

## 2013-11-06 DIAGNOSIS — I674 Hypertensive encephalopathy: Secondary | ICD-10-CM | POA: Diagnosis present

## 2013-11-06 DIAGNOSIS — G609 Hereditary and idiopathic neuropathy, unspecified: Secondary | ICD-10-CM | POA: Diagnosis present

## 2013-11-06 DIAGNOSIS — R0902 Hypoxemia: Secondary | ICD-10-CM | POA: Diagnosis not present

## 2013-11-06 DIAGNOSIS — F22 Delusional disorders: Secondary | ICD-10-CM | POA: Diagnosis present

## 2013-11-06 DIAGNOSIS — R55 Syncope and collapse: Secondary | ICD-10-CM

## 2013-11-06 LAB — URINALYSIS, ROUTINE W REFLEX MICROSCOPIC
Glucose, UA: NEGATIVE mg/dL
Hgb urine dipstick: NEGATIVE
KETONES UR: NEGATIVE mg/dL
LEUKOCYTES UA: NEGATIVE
NITRITE: NEGATIVE
Protein, ur: NEGATIVE mg/dL
Specific Gravity, Urine: 1.027 (ref 1.005–1.030)
Urobilinogen, UA: 1 mg/dL (ref 0.0–1.0)
pH: 5.5 (ref 5.0–8.0)

## 2013-11-06 LAB — I-STAT TROPONIN, ED: Troponin i, poc: 0.01 ng/mL (ref 0.00–0.08)

## 2013-11-06 LAB — HEPATIC FUNCTION PANEL
ALT: 12 U/L (ref 0–35)
AST: 21 U/L (ref 0–37)
Albumin: 3.6 g/dL (ref 3.5–5.2)
Alkaline Phosphatase: 100 U/L (ref 39–117)
Total Bilirubin: 0.5 mg/dL (ref 0.3–1.2)
Total Protein: 6.8 g/dL (ref 6.0–8.3)

## 2013-11-06 LAB — CBC WITH DIFFERENTIAL/PLATELET
BASOS PCT: 0 % (ref 0–1)
Basophils Absolute: 0 10*3/uL (ref 0.0–0.1)
EOS ABS: 0.1 10*3/uL (ref 0.0–0.7)
EOS PCT: 2 % (ref 0–5)
HCT: 39.2 % (ref 36.0–46.0)
HEMOGLOBIN: 12.5 g/dL (ref 12.0–15.0)
LYMPHS ABS: 1.5 10*3/uL (ref 0.7–4.0)
Lymphocytes Relative: 41 % (ref 12–46)
MCH: 26.7 pg (ref 26.0–34.0)
MCHC: 31.9 g/dL (ref 30.0–36.0)
MCV: 83.8 fL (ref 78.0–100.0)
Monocytes Absolute: 0.3 10*3/uL (ref 0.1–1.0)
Monocytes Relative: 7 % (ref 3–12)
Neutro Abs: 1.9 10*3/uL (ref 1.7–7.7)
Neutrophils Relative %: 50 % (ref 43–77)
Platelets: 191 10*3/uL (ref 150–400)
RBC: 4.68 MIL/uL (ref 3.87–5.11)
RDW: 14.6 % (ref 11.5–15.5)
WBC: 3.8 10*3/uL — ABNORMAL LOW (ref 4.0–10.5)

## 2013-11-06 LAB — COMPREHENSIVE METABOLIC PANEL
ALT: 13 U/L (ref 0–35)
AST: 23 U/L (ref 0–37)
Albumin: 3.6 g/dL (ref 3.5–5.2)
Alkaline Phosphatase: 105 U/L (ref 39–117)
BUN: 21 mg/dL (ref 6–23)
CALCIUM: 9.2 mg/dL (ref 8.4–10.5)
CO2: 25 mEq/L (ref 19–32)
CREATININE: 1.21 mg/dL — AB (ref 0.50–1.10)
Chloride: 102 mEq/L (ref 96–112)
GFR calc non Af Amer: 40 mL/min — ABNORMAL LOW (ref >90)
GFR, EST AFRICAN AMERICAN: 46 mL/min — AB (ref >90)
GLUCOSE: 186 mg/dL — AB (ref 70–99)
Potassium: 3.9 mEq/L (ref 3.7–5.3)
Sodium: 142 mEq/L (ref 137–147)
Total Bilirubin: 0.6 mg/dL (ref 0.3–1.2)
Total Protein: 6.8 g/dL (ref 6.0–8.3)

## 2013-11-06 LAB — CBG MONITORING, ED: GLUCOSE-CAPILLARY: 166 mg/dL — AB (ref 70–99)

## 2013-11-06 MED ORDER — LABETALOL HCL 5 MG/ML IV SOLN
20.0000 mg | Freq: Once | INTRAVENOUS | Status: AC
  Administered 2013-11-06: 20 mg via INTRAVENOUS
  Filled 2013-11-06: qty 4

## 2013-11-06 NOTE — ED Notes (Signed)
[place] MD at bedside. 

## 2013-11-06 NOTE — ED Notes (Signed)
Pt. presents with elevated blood pressure , family reported confusion , generalized weakness and headache yesterday .

## 2013-11-06 NOTE — ED Notes (Signed)
Patient transported to CT 

## 2013-11-06 NOTE — ED Provider Notes (Signed)
CSN: [id number]     Arrival date & time 11/06/13  7829 History   First MD Initiated Contact with Patient 11/06/13 2109     Chief Complaint  Patient presents with  . Hypertension     (Consider location/radiation/quality/duration/timing/severity/associated sxs/prior Treatment) HPI 80 year old female brought in by son for confusion. This started yesterday. The confusion is described as patient feels like someone is poisoning her. She describes this as saying she's having memory issues and thinks someone is affecting her. Patient has had trouble remembering things acutely since yesterday is confused on what day it is. She's been having intermittent headache as well. Denies any current headache. No nausea, vomiting, or blurred vision. No focal weakness. No fevers. No urinary symptoms. Family notes she seems to be doing worse today. She normally takes blood pressure medicines but has been out for the past week and has not taken any of them. Denies chest pain.  Past Medical History  Diagnosis Date  . Hypertension   . Diabetes mellitus   . DJD (degenerative joint disease)   . Peripheral neuropathy   . Mild aortic sclerosis   . Spinal stenosis   . Diverticulosis   . DJD (degenerative joint disease)     knees, back & cervical   Past Surgical History  Procedure Laterality Date  . Cholecystectomy     No family history on file. History  Substance Use Topics  . Smoking status: Never Smoker   . Smokeless tobacco: Not on file  . Alcohol Use: No   OB History   Grav Para Term Preterm Abortions TAB SAB Ect Mult Living                 Review of Systems  Constitutional: Negative for fever.  Eyes: Negative for visual disturbance.  Respiratory: Negative for shortness of breath.   Cardiovascular: Negative for chest pain.  Genitourinary: Negative for dysuria.  Neurological: Positive for speech difficulty (trouble getting out the right words) and headaches. Negative for weakness and numbness.   Psychiatric/Behavioral: Positive for confusion.  All other systems reviewed and are negative.      Allergies  Benicar; Lyrica; Metformin and related; Toprol xl; and Vicodin  Home Medications   Current Outpatient Rx  Name  Route  Sig  Dispense  Refill  . diltiazem (CARDIZEM CD) 240 MG 24 hr capsule   Oral   Take 240 mg by mouth daily.         Marland Kitchen glimepiride (AMARYL) 2 MG tablet   Oral   Take 2 mg by mouth daily.         Marland Kitchen losartan (COZAAR) 50 MG tablet   Oral   Take 50 mg by mouth daily.         . naproxen sodium (ANAPROX) 220 MG tablet   Oral   Take 220-440 mg by mouth 2 (two) times daily as needed (for pain).          BP 239/95  Pulse 72  Temp(Src) 98.5 F (36.9 C) (Oral)  Resp 14  SpO2 99% Physical Exam  Nursing note and vitals reviewed. Constitutional: She appears well-developed and well-nourished.  HENT:  Head: Normocephalic and atraumatic.  Right Ear: External ear normal.  Left Ear: External ear normal.  Nose: Nose normal.  Eyes: EOM are normal. Pupils are equal, round, and reactive to light. Right eye exhibits no discharge. Left eye exhibits no discharge.  Cardiovascular: Normal rate, regular rhythm and normal heart sounds.   Pulmonary/Chest: Effort normal and breath  sounds normal.  Abdominal: Soft. She exhibits no distension. There is no tenderness.  Neurological: She is alert. She is disoriented.  Patient knows day of week (told earlier), knows month and president but is wrong on year. CN 2-12 grossly intact. 5/5 strength in all 4 extremities  Skin: Skin is warm and dry.    ED Course  Procedures (including critical care time) Labs Review Labs Reviewed  CBC WITH DIFFERENTIAL - Abnormal; Notable for the following:    WBC 3.8 (*)    All other components within normal limits  COMPREHENSIVE METABOLIC PANEL - Abnormal; Notable for the following:    Glucose, Bld 186 (*)    Creatinine, Ser 1.21 (*)    GFR calc non Af Amer 40 (*)    GFR calc Af  Amer 46 (*)    All other components within normal limits  URINALYSIS, ROUTINE W REFLEX MICROSCOPIC - Abnormal; Notable for the following:    Bilirubin Urine SMALL (*)    All other components within normal limits  CBG MONITORING, ED - Abnormal; Notable for the following:    Glucose-Capillary 166 (*)    All other components within normal limits  HEPATIC FUNCTION PANEL  I-STAT TROPOININ, ED   Imaging Review Ct Head Wo Contrast  11/06/2013   CLINICAL DATA:  Elevated blood pressure, confusion, headache  EXAM: CT HEAD WITHOUT CONTRAST  TECHNIQUE: Contiguous axial images were obtained from the base of the skull through the vertex without intravenous contrast.  COMPARISON:  CT HEAD W/O CM dated 08/09/2013; CT HEAD W/O CM dated 01/12/2012  FINDINGS: Mild to moderate diffuse atrophy. Moderate low attenuation in the deep white matter. Basal ganglia calcifications. Calcification of the anterior falx. No hydrocephalus, hemorrhage, or extra-axial fluid. No evidence of vascular territory infarct. Calvarium is intact.  IMPRESSION: Moderate chronic involutional change.  No acute findings.   Electronically Signed   By: Esperanza Heiraymond  Rubner M.D.   On: 11/06/2013 22:07     EKG Interpretation   Date/Time:  Tuesday November 06 2013 23:07:12 EDT Ventricular Rate:  66 PR Interval:  162 QRS Duration: 92 QT Interval:  406 QTC Calculation: 425 R Axis:   55 Text Interpretation:  Normal sinus rhythm Right atrial enlargement  Nonspecific T wave abnormality Abnormal ECG T wave changes appear new  Confirmed by Amberli Ruegg  MD, Gissella Niblack ([id number]) on 11/06/2013 11:34:46 PM      MDM   Final diagnoses:  Hypertensive encephalopathy    Patient is neurologically intact except for mild confusion. I believe her confusion is coming from her severe hypertension, with initial blood pressure of 220 systolic. She's given 20 mg labetalol IV she's symptomatic. She denies chest pain or shortness of breath. Her EKG does show she concerning new  T-wave changes. The blood pressure did decrease with labetalol but she has not improved on her confusion standpoint. No stroke on the CT scan. Urine shows no UTI. Discussed with Dr. Welton FlakesKhan, who will come to admit patient.    Audree CamelScott T Donnell Wion, MD 11/07/13 [phone]

## 2013-11-06 NOTE — ED Notes (Signed)
Pt. States that she just has not taken her meds in over a weeks time. States that she has some but just hasn't taken them.

## 2013-11-06 NOTE — ED Notes (Signed)
Monitors are currently down, NT notified to collect.

## 2013-11-07 ENCOUNTER — Inpatient Hospital Stay (HOSPITAL_COMMUNITY): Payer: Medicare Other

## 2013-11-07 DIAGNOSIS — I10 Essential (primary) hypertension: Secondary | ICD-10-CM

## 2013-11-07 LAB — CBC
HEMATOCRIT: 36.4 % (ref 36.0–46.0)
HEMOGLOBIN: 11.7 g/dL — AB (ref 12.0–15.0)
MCH: 26.5 pg (ref 26.0–34.0)
MCHC: 32.1 g/dL (ref 30.0–36.0)
MCV: 82.5 fL (ref 78.0–100.0)
Platelets: 186 10*3/uL (ref 150–400)
RBC: 4.41 MIL/uL (ref 3.87–5.11)
RDW: 14.3 % (ref 11.5–15.5)
WBC: 4.4 10*3/uL (ref 4.0–10.5)

## 2013-11-07 LAB — COMPREHENSIVE METABOLIC PANEL
ALK PHOS: 92 U/L (ref 39–117)
ALT: 11 U/L (ref 0–35)
AST: 20 U/L (ref 0–37)
Albumin: 3.1 g/dL — ABNORMAL LOW (ref 3.5–5.2)
BILIRUBIN TOTAL: 0.6 mg/dL (ref 0.3–1.2)
BUN: 19 mg/dL (ref 6–23)
CHLORIDE: 103 meq/L (ref 96–112)
CO2: 26 meq/L (ref 19–32)
Calcium: 9.2 mg/dL (ref 8.4–10.5)
Creatinine, Ser: 1.13 mg/dL — ABNORMAL HIGH (ref 0.50–1.10)
GFR, EST AFRICAN AMERICAN: 50 mL/min — AB (ref >90)
GFR, EST NON AFRICAN AMERICAN: 43 mL/min — AB (ref >90)
Glucose, Bld: 158 mg/dL — ABNORMAL HIGH (ref 70–99)
POTASSIUM: 3.6 meq/L — AB (ref 3.7–5.3)
Sodium: 142 mEq/L (ref 137–147)
Total Protein: 6.2 g/dL (ref 6.0–8.3)

## 2013-11-07 LAB — PROTIME-INR
INR: 0.99 (ref 0.00–1.49)
Prothrombin Time: 12.9 seconds (ref 11.6–15.2)

## 2013-11-07 LAB — APTT: APTT: 26 s (ref 24–37)

## 2013-11-07 LAB — GLUCOSE, CAPILLARY: Glucose-Capillary: 121 mg/dL — ABNORMAL HIGH (ref 70–99)

## 2013-11-07 MED ORDER — ADULT MULTIVITAMIN W/MINERALS CH
1.0000 | ORAL_TABLET | Freq: Every day | ORAL | Status: DC
  Administered 2013-11-07 – 2013-11-16 (×7): 1 via ORAL
  Filled 2013-11-07 (×10): qty 1

## 2013-11-07 MED ORDER — GADOBENATE DIMEGLUMINE 529 MG/ML IV SOLN
15.0000 mL | Freq: Once | INTRAVENOUS | Status: AC | PRN
  Administered 2013-11-07: 15 mL via INTRAVENOUS

## 2013-11-07 MED ORDER — FOLIC ACID 1 MG PO TABS
1.0000 mg | ORAL_TABLET | Freq: Every day | ORAL | Status: DC
  Administered 2013-11-08 – 2013-11-16 (×6): 1 mg via ORAL
  Filled 2013-11-07 (×10): qty 1

## 2013-11-07 MED ORDER — THIAMINE HCL 100 MG/ML IJ SOLN
100.0000 mg | Freq: Every day | INTRAMUSCULAR | Status: DC
  Administered 2013-11-07: 100 mg via INTRAVENOUS
  Filled 2013-11-07: qty 1

## 2013-11-07 MED ORDER — FOLIC ACID 5 MG/ML IJ SOLN
1.0000 mg | Freq: Every day | INTRAMUSCULAR | Status: DC
  Filled 2013-11-07 (×2): qty 0.2

## 2013-11-07 MED ORDER — ONDANSETRON HCL 4 MG/2ML IJ SOLN: 4.0000 mg | Freq: Four times a day (QID) | INTRAMUSCULAR | Status: DC | PRN

## 2013-11-07 MED ORDER — SENNA 8.6 MG PO TABS
1.0000 | ORAL_TABLET | Freq: Two times a day (BID) | ORAL | Status: DC
  Administered 2013-11-07 – 2013-11-16 (×12): 8.6 mg via ORAL
  Filled 2013-11-07 (×20): qty 1

## 2013-11-07 MED ORDER — HEPARIN SODIUM (PORCINE) 5000 UNIT/ML IJ SOLN
5000.0000 [IU] | Freq: Three times a day (TID) | INTRAMUSCULAR | Status: DC
  Administered 2013-11-07 – 2013-11-14 (×14): 5000 [IU] via SUBCUTANEOUS
  Filled 2013-11-07 (×28): qty 1

## 2013-11-07 MED ORDER — BISACODYL 10 MG RE SUPP: 10.0000 mg | Freq: Every day | RECTAL | Status: DC | PRN

## 2013-11-07 MED ORDER — ASPIRIN EC 81 MG PO TBEC
81.0000 mg | DELAYED_RELEASE_TABLET | Freq: Every day | ORAL | Status: DC
  Administered 2013-11-07 – 2013-11-16 (×7): 81 mg via ORAL
  Filled 2013-11-07 (×10): qty 1

## 2013-11-07 MED ORDER — AMLODIPINE BESYLATE 5 MG PO TABS
5.0000 mg | ORAL_TABLET | Freq: Every day | ORAL | Status: DC
  Administered 2013-11-08 – 2013-11-12 (×4): 5 mg via ORAL
  Filled 2013-11-07 (×7): qty 1

## 2013-11-07 MED ORDER — VITAMIN B-1 100 MG PO TABS
100.0000 mg | ORAL_TABLET | Freq: Every day | ORAL | Status: DC
  Administered 2013-11-08 – 2013-11-16 (×7): 100 mg via ORAL
  Filled 2013-11-07 (×10): qty 1

## 2013-11-07 MED ORDER — DILTIAZEM HCL ER COATED BEADS 240 MG PO CP24
240.0000 mg | ORAL_CAPSULE | Freq: Every day | ORAL | Status: DC
  Administered 2013-11-07 – 2013-11-16 (×7): 240 mg via ORAL
  Filled 2013-11-07 (×10): qty 1

## 2013-11-07 MED ORDER — HYDRALAZINE HCL 20 MG/ML IJ SOLN
10.0000 mg | Freq: Four times a day (QID) | INTRAMUSCULAR | Status: DC | PRN
  Filled 2013-11-07: qty 1

## 2013-11-07 MED ORDER — ONDANSETRON HCL 4 MG PO TABS: 4.0000 mg | ORAL_TABLET | Freq: Four times a day (QID) | ORAL | Status: DC | PRN

## 2013-11-07 MED ORDER — ALUM & MAG HYDROXIDE-SIMETH 200-200-20 MG/5ML PO SUSP
30.0000 mL | Freq: Four times a day (QID) | ORAL | Status: DC | PRN
  Administered 2013-11-16: 30 mL via ORAL
  Filled 2013-11-07: qty 30

## 2013-11-07 MED ORDER — HALOPERIDOL LACTATE 5 MG/ML IJ SOLN
INTRAMUSCULAR | Status: AC
  Administered 2013-11-07: 1 mg
  Filled 2013-11-07: qty 1

## 2013-11-07 MED ORDER — ACETAMINOPHEN 650 MG RE SUPP: 650.0000 mg | Freq: Four times a day (QID) | RECTAL | Status: DC | PRN

## 2013-11-07 MED ORDER — DEXTROSE-NACL 5-0.45 % IV SOLN
INTRAVENOUS | Status: DC
  Administered 2013-11-07 (×2): via INTRAVENOUS

## 2013-11-07 MED ORDER — ACETAMINOPHEN 325 MG PO TABS
650.0000 mg | ORAL_TABLET | Freq: Four times a day (QID) | ORAL | Status: DC | PRN
  Administered 2013-11-12: 650 mg via ORAL
  Filled 2013-11-07: qty 2

## 2013-11-07 MED ORDER — LOSARTAN POTASSIUM 50 MG PO TABS
50.0000 mg | ORAL_TABLET | Freq: Every day | ORAL | Status: DC
  Administered 2013-11-07 – 2013-11-08 (×2): 50 mg via ORAL
  Filled 2013-11-07 (×3): qty 1

## 2013-11-07 MED ORDER — MAGNESIUM CITRATE PO SOLN: 1.0000 | Freq: Once | ORAL | Status: AC | PRN

## 2013-11-07 MED ORDER — GLIMEPIRIDE 2 MG PO TABS
2.0000 mg | ORAL_TABLET | Freq: Every day | ORAL | Status: DC
  Administered 2013-11-07 – 2013-11-16 (×8): 2 mg via ORAL
  Filled 2013-11-07 (×11): qty 1

## 2013-11-07 MED ORDER — ISOSORBIDE MONONITRATE ER 30 MG PO TB24
30.0000 mg | ORAL_TABLET | Freq: Every day | ORAL | Status: DC
  Administered 2013-11-07 – 2013-11-13 (×6): 30 mg via ORAL
  Filled 2013-11-07 (×8): qty 1

## 2013-11-07 MED ORDER — NITROGLYCERIN 2 % TD OINT
1.0000 [in_us] | TOPICAL_OINTMENT | Freq: Four times a day (QID) | TRANSDERMAL | Status: DC
Start: 2013-11-07 — End: 2013-11-07
  Administered 2013-11-07: 1 [in_us] via TOPICAL
  Filled 2013-11-07: qty 30

## 2013-11-07 NOTE — Progress Notes (Signed)
NT called me to come in pt.'s room because pt. Was disoriented and trying to leave the hospital, I came into room to find pt. Had gotten out of bed with the bed alarm on and was attempting to leave the room. I attempted to reorient pt. And advise her why she needed stay for her safety and attempted to get pt. To sit in bed but pt. Refused to get in bed and would only sit in a chair. 4 RN's and a NT had to assist me with pt. When she began trying to walk down with IV pole still attached, we walked with pt. To keep her steady and also so she couldn't pull her IV out, once pt. Calmed down after I notified MD about pt and he ordered Haldol 1 mg IV, IM, or oral Q6 hrs, I disconnected pt. From IV fluids because she had pulled her IV out. I notified physician pt. No longer had IV access and physician stated since she was now eating and drinking as of dinnertime it was ok for now. MD also authorized order for sitter for pt., Pt. Remained confused and physically and verbally aggressive toward staff.

## 2013-11-07 NOTE — ED Notes (Signed)
Left arm BP 190/96

## 2013-11-07 NOTE — Progress Notes (Signed)
PCP is Dr. Kevan NyGates.  I called [phone] ([facility] voice mail) to let them know she was here.  Algis DownsMarianne York, PA-C [facility] Pager: [phone]

## 2013-11-07 NOTE — Progress Notes (Signed)
Subjective: Samantha Bond is still confused this morning but recognizes me and knows my name.  She did not know what city she was in or what the name of the hospital list which is very unusual for her.  She has had hypertensive encephalopathy in the past.  Unfortunately she stopped taking her medications at times and has had a similar episode before requiring hospitalization where she was delirious and combative and paranoid secondary to hypertensive encephalopathy  Objective: Weight change:   Intake/Output Summary (Last 24 hours) at 11/07/13 0739 Last data filed at 11/07/13 0600  Gross per 24 hour  Intake    240 ml  Output    300 ml  Net    -60 ml   Filed Vitals:   11/07/13 0115 11/07/13 0221 11/07/13 0414 11/07/13 0417  BP: 180/90 224/89 162/61 186/76  Pulse: 73 71 74   Temp:  98 F (36.7 C) 98.4 F (36.9 C)   TempSrc:  Oral Oral   Resp: $Remo'13 18 16   'CmsHI$ Weight:  186 lb 8.2 oz (84.6 kg)    SpO2: 100% 98% 100%     General Appearance: Alert, cooperative, no distress, appears stated age Lungs: Clear to auscultation bilaterally, respirations unlabored Heart: Regular rate and rhythm, S1 and S2 normal, no murmur, rub or gallop Abdomen: Soft, non-tender, bowel sounds active all four quadrants, no masses, no organomegaly Extremities: Extremities normal, atraumatic, no cyanosis or edema Neuro: Oriented x1, her name.  Alert, nonfocal   Lab Results: Results for orders placed during the hospital encounter of 11/06/13 (from the past 48 hour(s))  CBC WITH DIFFERENTIAL     Status: Abnormal   Collection Time    11/06/13  7:33 PM      Result Value Ref Range   WBC 3.8 (*) 4.0 - 10.5 K/uL   RBC 4.68  3.87 - 5.11 MIL/uL   Hemoglobin 12.5  12.0 - 15.0 g/dL   HCT 39.2  36.0 - 46.0 %   MCV 83.8  78.0 - 100.0 fL   MCH 26.7  26.0 - 34.0 pg   MCHC 31.9  30.0 - 36.0 g/dL   RDW 14.6  11.5 - 15.5 %   Platelets 191  150 - 400 K/uL   Neutrophils Relative % 50  43 - 77 %   Neutro Abs 1.9  1.7 - 7.7 K/uL    Lymphocytes Relative 41  12 - 46 %   Lymphs Abs 1.5  0.7 - 4.0 K/uL   Monocytes Relative 7  3 - 12 %   Monocytes Absolute 0.3  0.1 - 1.0 K/uL   Eosinophils Relative 2  0 - 5 %   Eosinophils Absolute 0.1  0.0 - 0.7 K/uL   Basophils Relative 0  0 - 1 %   Basophils Absolute 0.0  0.0 - 0.1 K/uL  COMPREHENSIVE METABOLIC PANEL     Status: Abnormal   Collection Time    11/06/13  7:33 PM      Result Value Ref Range   Sodium 142  137 - 147 mEq/L   Potassium 3.9  3.7 - 5.3 mEq/L   Chloride 102  96 - 112 mEq/L   CO2 25  19 - 32 mEq/L   Glucose, Bld 186 (*) 70 - 99 mg/dL   BUN 21  6 - 23 mg/dL   Creatinine, Ser 1.21 (*) 0.50 - 1.10 mg/dL   Calcium 9.2  8.4 - 10.5 mg/dL   Total Protein 6.8  6.0 - 8.3 g/dL  Albumin 3.6  3.5 - 5.2 g/dL   AST 23  0 - 37 U/L   ALT 13  0 - 35 U/L   Alkaline Phosphatase 105  39 - 117 U/L   Total Bilirubin 0.6  0.3 - 1.2 mg/dL   GFR calc non Af Amer 40 (*) >90 mL/min   GFR calc Af Amer 46 (*) >90 mL/min   Comment: (NOTE)     The eGFR has been calculated using the CKD EPI equation.     This calculation has not been validated in all clinical situations.     eGFR's persistently <90 mL/min signify possible Chronic Kidney     Disease.  CBG MONITORING, ED     Status: Abnormal   Collection Time    11/06/13  9:07 PM      Result Value Ref Range   Glucose-Capillary 166 (*) 70 - 99 mg/dL   Comment 1 Documented in Chart     Comment 2 Notify RN    HEPATIC FUNCTION PANEL     Status: None   Collection Time    11/06/13  9:33 PM      Result Value Ref Range   Total Protein 6.8  6.0 - 8.3 g/dL   Albumin 3.6  3.5 - 5.2 g/dL   AST 21  0 - 37 U/L   ALT 12  0 - 35 U/L   Alkaline Phosphatase 100  39 - 117 U/L   Total Bilirubin 0.5  0.3 - 1.2 mg/dL   Bilirubin, Direct <0.2  0.0 - 0.3 mg/dL   Indirect Bilirubin NOT CALCULATED  0.3 - 0.9 mg/dL  Randolm Idol, ED     Status: None   Collection Time    11/06/13  9:47 PM      Result Value Ref Range   Troponin i, poc 0.01   0.00 - 0.08 ng/mL   Comment 3            Comment: Due to the release kinetics of cTnI,     a negative result within the first hours     of the onset of symptoms does not rule out     myocardial infarction with certainty.     If myocardial infarction is still suspected,     repeat the test at appropriate intervals.  URINALYSIS, ROUTINE W REFLEX MICROSCOPIC     Status: Abnormal   Collection Time    11/06/13 11:31 PM      Result Value Ref Range   Color, Urine YELLOW  YELLOW   APPearance CLEAR  CLEAR   Specific Gravity, Urine 1.027  1.005 - 1.030   pH 5.5  5.0 - 8.0   Glucose, UA NEGATIVE  NEGATIVE mg/dL   Hgb urine dipstick NEGATIVE  NEGATIVE   Bilirubin Urine SMALL (*) NEGATIVE   Ketones, ur NEGATIVE  NEGATIVE mg/dL   Protein, ur NEGATIVE  NEGATIVE mg/dL   Urobilinogen, UA 1.0  0.0 - 1.0 mg/dL   Nitrite NEGATIVE  NEGATIVE   Leukocytes, UA NEGATIVE  NEGATIVE   Comment: MICROSCOPIC NOT DONE ON URINES WITH NEGATIVE PROTEIN, BLOOD, LEUKOCYTES, NITRITE, OR GLUCOSE <1000 mg/dL.  CBC     Status: Abnormal   Collection Time    11/07/13  4:00 AM      Result Value Ref Range   WBC 4.4  4.0 - 10.5 K/uL   RBC 4.41  3.87 - 5.11 MIL/uL   Hemoglobin 11.7 (*) 12.0 - 15.0 g/dL   HCT 36.4  36.0 -  46.0 %   MCV 82.5  78.0 - 100.0 fL   MCH 26.5  26.0 - 34.0 pg   MCHC 32.1  30.0 - 36.0 g/dL   RDW 14.3  11.5 - 15.5 %   Platelets 186  150 - 400 K/uL  COMPREHENSIVE METABOLIC PANEL     Status: Abnormal   Collection Time    11/07/13  4:00 AM      Result Value Ref Range   Sodium 142  137 - 147 mEq/L   Potassium 3.6 (*) 3.7 - 5.3 mEq/L   Chloride 103  96 - 112 mEq/L   CO2 26  19 - 32 mEq/L   Glucose, Bld 158 (*) 70 - 99 mg/dL   BUN 19  6 - 23 mg/dL   Creatinine, Ser 1.13 (*) 0.50 - 1.10 mg/dL   Calcium 9.2  8.4 - 10.5 mg/dL   Total Protein 6.2  6.0 - 8.3 g/dL   Albumin 3.1 (*) 3.5 - 5.2 g/dL   AST 20  0 - 37 U/L   ALT 11  0 - 35 U/L   Alkaline Phosphatase 92  39 - 117 U/L   Total Bilirubin 0.6   0.3 - 1.2 mg/dL   GFR calc non Af Amer 43 (*) >90 mL/min   GFR calc Af Amer 50 (*) >90 mL/min   Comment: (NOTE)     The eGFR has been calculated using the CKD EPI equation.     This calculation has not been validated in all clinical situations.     eGFR's persistently <90 mL/min signify possible Chronic Kidney     Disease.  APTT     Status: None   Collection Time    11/07/13  4:00 AM      Result Value Ref Range   aPTT 26  24 - 37 seconds  PROTIME-INR     Status: None   Collection Time    11/07/13  4:00 AM      Result Value Ref Range   Prothrombin Time 12.9  11.6 - 15.2 seconds   INR 0.99  0.00 - 1.49  GLUCOSE, CAPILLARY     Status: Abnormal   Collection Time    11/07/13  6:43 AM      Result Value Ref Range   Glucose-Capillary 121 (*) 70 - 99 mg/dL    Studies/Results: Ct Head Wo Contrast  11/06/2013   CLINICAL DATA:  Elevated blood pressure, confusion, headache  EXAM: CT HEAD WITHOUT CONTRAST  TECHNIQUE: Contiguous axial images were obtained from the base of the skull through the vertex without intravenous contrast.  COMPARISON:  CT HEAD W/O CM dated 08/09/2013; CT HEAD W/O CM dated 01/12/2012  FINDINGS: Mild to moderate diffuse atrophy. Moderate low attenuation in the deep white matter. Basal ganglia calcifications. Calcification of the anterior falx. No hydrocephalus, hemorrhage, or extra-axial fluid. No evidence of vascular territory infarct. Calvarium is intact.  IMPRESSION: Moderate chronic involutional change.  No acute findings.   Electronically Signed   By: Skipper Cliche M.D.   On: 11/06/2013 22:07   Dg Chest Portable 1 View  11/07/2013   CLINICAL DATA:  Hypertension.  EXAM: PORTABLE CHEST - 1 VIEW  COMPARISON:  08/09/2013  FINDINGS: Mild cardiomegaly which is chronic. There is unchanged prominence of the ascending aorta which is accentuated by rotation on the lower image. Stable appearance of the right hilum since 2012 at least. No edema or consolidation. No effusion or  pneumothorax.  IMPRESSION: Stable exam.   No  active disease.   Electronically Signed   By: Jorje Guild M.D.   On: 11/07/2013 00:24   Medications: Scheduled Meds: . aspirin EC  81 mg Oral Daily  . diltiazem  240 mg Oral Daily  . folic acid  1 mg Intravenous Daily  . glimepiride  2 mg Oral Q breakfast  . heparin  5,000 Units Subcutaneous 3 times per day  . losartan  50 mg Oral Daily  . multivitamin with minerals  1 tablet Oral Daily  . nitroGLYCERIN  1 inch Topical 4 times per day  . senna  1 tablet Oral BID  . thiamine  100 mg Intravenous Daily   Continuous Infusions: . dextrose 5 % and 0.45% NaCl 50 mL/hr at 11/07/13 0420   PRN Meds:.acetaminophen, acetaminophen, alum & mag hydroxide-simeth, bisacodyl, hydrALAZINE, magnesium citrate, ondansetron (ZOFRAN) IV, ondansetron  Assessment/Plan: Principal Problem:   Accelerated hypertension Active Problems:   DM (diabetes mellitus)   Impaired cognition   1. Accelerated Hypertension -likely relatd to not taking her medications  -Medications restarted  -she was given labetolol in the ED with some improvement  -Change nitro paste to Imdur -Continue telemetry bed  -MRI of brain pending 2. Diabetes  -Continue her amaryl  -monitor CBG and cover as needed  -carb modified diet  3. Impaired cognition  -appears to be steady decline per son  -Quite possibly related to hypertension  Code Status: Full Code (must indicate code status--if unknown or must be presumed, indicate so)  Family Communication: Son in room yesterday admission and communicated with by admitting physician Disposition Plan: Home (indicate anticipated LOS) - hopefully will improve today and be discharged tomorrow   LOS: 1 day   Henrine Screws, MD 11/07/2013, 7:39 AM

## 2013-11-07 NOTE — H&P (Signed)
[facility] History and Physical  Samantha Bond ZOX:[id number] DOB: 02-17-1927 DOA: 11/06/2013  Referring physician: Pricilla Loveless, MD PCP: Pearla Dubonnet, MD   Chief Complaint: Accelerated Hypertension  HPI: Samantha Bond is a 80 y.o. female presented to the ED with confusion. The son noted that she had note been acting her normal self and brought her to the ED to be evaluated. She now seems to be back to normal. She apparently has some difficulty searching for words and phrases. She had been having headaches also. She is a known hypertensive and had run out of her medications but not sure for how long she has not been taking her medications. She had no syncope. She has no seizures. She denies having any nausea and there is no focal deficits noted. She also denies having any chest pain.    Review of Systems:  Constitutional:  No weight loss, night sweats, Fevers, chills, fatigue.  HEENT:  ++headaches, Difficulty swallowing,Tooth/dental problems,Sore throat,  No sneezing, itching, ear ache, nasal congestion, post nasal drip,  Cardio-vascular:  No chest pain, Orthopnea, PND, swelling in lower extremities, anasarca, dizziness, palpitations  GI:  No heartburn, indigestion, abdominal pain, nausea, vomiting, diarrhea, change in bowel habits, loss of appetite  Resp:  No shortness of breath with exertion or at rest. No excess mucus, no productive cough, No non-productive cough, No coughing up of blood.No change in color of mucus.No wheezing.No chest wall deformity  Skin:  no rash or lesions.  GU:  no dysuria, change in color of urine, no urgency or frequency. No flank pain.  Musculoskeletal:  No joint pain or swelling. No decreased range of motion. No back pain.  Psych:  Has been more forgetful per her son   Past Medical History  Diagnosis Date  . Hypertension   . Diabetes mellitus   . DJD (degenerative joint disease)   . Peripheral neuropathy   . Mild  aortic sclerosis   . Spinal stenosis   . Diverticulosis   . DJD (degenerative joint disease)     knees, back & cervical   Past Surgical History  Procedure Laterality Date  . Cholecystectomy     Social History:  reports that she has never smoked. She does not have any smokeless tobacco history on file. She reports that she does not drink alcohol or use illicit drugs.  Allergies  Allergen Reactions  . Benicar [Olmesartan] Other (See Comments)    arthralgias  . Lyrica [Pregabalin] Other (See Comments)    Lethargy & blurred vision  . Metformin And Related Diarrhea and Other (See Comments)    Elevated pancreatic enzymes  . Toprol Xl [Metoprolol] Other (See Comments)    Asthma  . Vicodin [Hydrocodone-Acetaminophen] Nausea Only    No family history on file.   Prior to Admission medications   Medication Sig Start Date End Date Taking? Authorizing Provider  diltiazem (CARDIZEM CD) 240 MG 24 hr capsule Take 240 mg by mouth daily.    Historical Provider, MD  glimepiride (AMARYL) 2 MG tablet Take 2 mg by mouth daily.    Historical Provider, MD  losartan (COZAAR) 50 MG tablet Take 50 mg by mouth daily.    Historical Provider, MD  naproxen sodium (ANAPROX) 220 MG tablet Take 220-440 mg by mouth 2 (two) times daily as needed (for pain).    Historical Provider, MD   Physical Exam: Filed Vitals:   11/06/13 2214  BP: 187/76  Pulse: 66  Temp:   Resp: 20  BP 187/76  Pulse 66  Temp(Src) 98.5 F (36.9 C) (Oral)  Resp 20  SpO2 98%  General:  Appears calm and comfortable Eyes: PERRL, normal lids, irises & conjunctiva ENT: grossly normal hearing, lips & tongue Neck: no LAD, masses or thyromegaly Cardiovascular: RRR, no m/r/g. No LE edema. Telemetry: SR, no arrhythmias  Respiratory: CTA bilaterally, no w/r/r. Normal respiratory effort. Abdomen: soft, ntnd Skin: no rash or induration seen on limited exam Musculoskeletal: grossly normal tone BUE/BLE Psychiatric: grossly normal mood  and affect, speech sometimes searching for words Neurologic: grossly non-focal.          Labs on Admission:  Basic Metabolic Panel:  Recent Labs Lab 11/06/13 1933  NA 142  K 3.9  CL 102  CO2 25  GLUCOSE 186*  BUN 21  CREATININE 1.21*  CALCIUM 9.2   Liver Function Tests:  Recent Labs Lab 11/06/13 1933 11/06/13 2133  AST 23 21  ALT 13 12  ALKPHOS 105 100  BILITOT 0.6 0.5  PROT 6.8 6.8  ALBUMIN 3.6 3.6   No results found for this basename: LIPASE, AMYLASE,  in the last 168 hours No results found for this basename: AMMONIA,  in the last 168 hours CBC:  Recent Labs Lab 11/06/13 1933  WBC 3.8*  NEUTROABS 1.9  HGB 12.5  HCT 39.2  MCV 83.8  PLT 191   Cardiac Enzymes: No results found for this basename: CKTOTAL, CKMB, CKMBINDEX, TROPONINI,  in the last 168 hours  BNP (last 3 results) No results found for this basename: PROBNP,  in the last 8760 hours CBG:  Recent Labs Lab 11/06/13 2107  GLUCAP 166*    Radiological Exams on Admission: Ct Head Wo Contrast  11/06/2013   CLINICAL DATA:  Elevated blood pressure, confusion, headache  EXAM: CT HEAD WITHOUT CONTRAST  TECHNIQUE: Contiguous axial images were obtained from the base of the skull through the vertex without intravenous contrast.  COMPARISON:  CT HEAD W/O CM dated 08/09/2013; CT HEAD W/O CM dated 01/12/2012  FINDINGS: Mild to moderate diffuse atrophy. Moderate low attenuation in the deep white matter. Basal ganglia calcifications. Calcification of the anterior falx. No hydrocephalus, hemorrhage, or extra-axial fluid. No evidence of vascular territory infarct. Calvarium is intact.  IMPRESSION: Moderate chronic involutional change.  No acute findings.   Electronically Signed   By: Esperanza Heiraymond  Rubner M.D.   On: 11/06/2013 22:07   Dg Chest Portable 1 View  11/07/2013   CLINICAL DATA:  Hypertension.  EXAM: PORTABLE CHEST - 1 VIEW  COMPARISON:  08/09/2013  FINDINGS: Mild cardiomegaly which is chronic. There is unchanged  prominence of the ascending aorta which is accentuated by rotation on the lower image. Stable appearance of the right hilum since 2012 at least. No edema or consolidation. No effusion or pneumothorax.  IMPRESSION: Stable exam.   No active disease.   Electronically Signed   By: Tiburcio PeaJonathan  Watts M.D.   On: 11/07/2013 00:24    EKG: Independently reviewed. NSR non-specific changes  Assessment/Plan Principal Problem:   Accelerated hypertension Active Problems:   DM (diabetes mellitus)   Impaired cognition   1. Accelerated Hypertension -likely relatd to not taking her medications -will restart her meds -she was given labetolol in the ED with some improvement -will add nitropaste to chest wall for now -will require telemetry bed -will also get an MRI of brain  2. Diabetes -restart her amaryl -monitor CBG and cover as needed -carb modified diet  3. Impaired cognition -appears to be steady decline  per son -?related to hypertension    Code Status: Full COde (must indicate code status--if unknown or must be presumed, indicate so) Family Communication: Son in room (indicate person spoken with, if applicable, with phone number if by telephone) Disposition Plan: Home (indicate anticipated LOS)  Time spent:  [facility] Pager [phone]

## 2013-11-07 NOTE — Progress Notes (Signed)
I advised oncoming nurse of pt.'s condition and orientation and that sitter order in place and pt. Refusing medications including B/P medications even though her B/P was Elevated. Pt. Was safe in her room with her daughter and sitter during and after report.

## 2013-11-08 LAB — GLUCOSE, CAPILLARY: Glucose-Capillary: 107 mg/dL — ABNORMAL HIGH (ref 70–99)

## 2013-11-08 MED ORDER — CLONIDINE HCL 0.2 MG/24HR TD PTWK
0.2000 mg | MEDICATED_PATCH | TRANSDERMAL | Status: DC
  Administered 2013-11-08: 0.2 mg via TRANSDERMAL
  Filled 2013-11-08: qty 1

## 2013-11-08 MED ORDER — HALOPERIDOL 1 MG PO TABS
1.0000 mg | ORAL_TABLET | Freq: Four times a day (QID) | ORAL | Status: DC | PRN
  Administered 2013-11-12: 1 mg via ORAL
  Filled 2013-11-08 (×2): qty 1

## 2013-11-08 MED ORDER — HALOPERIDOL LACTATE 5 MG/ML IJ SOLN
1.0000 mg | Freq: Four times a day (QID) | INTRAMUSCULAR | Status: DC | PRN
  Administered 2013-11-09 – 2013-11-14 (×2): 1 mg via INTRAMUSCULAR
  Filled 2013-11-08 (×4): qty 1

## 2013-11-08 MED ORDER — LORAZEPAM 1 MG PO TABS: 1.0000 mg | ORAL_TABLET | Freq: Once | ORAL | Status: AC

## 2013-11-08 MED ORDER — LORAZEPAM 2 MG/ML IJ SOLN
1.0000 mg | Freq: Once | INTRAMUSCULAR | Status: AC
  Administered 2013-11-08: 1 mg via INTRAMUSCULAR
  Filled 2013-11-08: qty 1

## 2013-11-08 MED ORDER — INFLUENZA VAC SPLIT QUAD 0.5 ML IM SUSP
0.5000 mL | INTRAMUSCULAR | Status: DC
  Filled 2013-11-08: qty 0.5

## 2013-11-08 NOTE — Care Management Note (Addendum)
    Page 1 of 2   11/13/2013     3:50:01 PM   CARE MANAGEMENT NOTE 11/13/2013  Patient:  Samantha Bond,Samantha Bond   Account Number:  [id number]  Date Initiated:  11/08/2013  Documentation initiated by:  HUTCHINSON,CRYSTAL  Subjective/Objective Assessment:   Admitted with confusion and HTN     Action/Plan:   CM to follow for disposition needs.   Anticipated DC Date:  11/09/2013   Anticipated DC Plan:  HOME/SELF CARE  In-house referral  Clinical Social Worker      DC Planning Services  CM consult      [facility]  NA   Choice offered to / List presented to:  [name]        HH arranged  HH-3 OT      Status of service:  In process, will continue to follow Medicare Important Message given?   (If response is "NO", the following Medicare IM given date fields will be blank) Date Medicare IM given:   Date Additional Medicare IM given:    Discharge Disposition:    Per UR Regulation:  Reviewed for med. necessity/level of care/duration of stay  If discussed at Long Length of Stay Meetings, dates discussed:   11/13/2013    Comments:  11/13/13 1530 Slayton Lubitz Lucretia RoersWood, RN, BSN, NCM [phone] Spoke with pt and pt son Samantha Bond(Jeff Terry [phone]) at bedside regarding discharge planning.  Trey PaulaJeff seems confused at to why pt would need to placed in SNF.  Informed Trey PaulaJeff that safety and medicine compliance of concern at this time and we are awaiting recommendations of PT.  Will continue to follow and update  disposition plan. 11/12/2013 MD has ordered SNF placement PT RECS Pending SW notified son at bedside today to discuss POC. Crystal Hutchinson RN, BSN, MSHL, CCM 11/12/2013  11/09/2013 EVENT: RR team called for CODE stroke.  Transferred to CT. PT EVALuation:  Deferred at this time. Disposition Plan:  Pending. Crystal Hutchinson RN, BSN, MSHL, CCM 11/09/2013  11/08/2013 Admitted with confusion, difficulty searching for words & phrases and HA.  BP  239/95.  Son reported noting changes in patient and  presented to ED.  Hx/o not taking BP med for unknown period of time. Hx/o HTN, DM CT, CHR and MRI negative [place]rose 50% 50cc/hour, back on BP meds and SITTER at bedside d/t confusion/Impaired cognition Social:  From home; son lives with patient Disposition Plan:  Pending Clinical biochemistCrystal Hutchinson RN, BSN, [place], CCM 11/08/2013

## 2013-11-08 NOTE — Progress Notes (Signed)
Subjective: Samantha Bond is better today. She was confused and tried to leave the hospital last night and pulled her IV out. This happened to her last hospitalization and once her blood pressure was controlled, she settled down. She was given 1 mg of IM Haldol last night and she slept well. Feeling better today and actually eating. Blood pressure is much improved. No complaints of pain or shortness of breath  Objective: Weight change:   Intake/Output Summary (Last 24 hours) at 11/08/13 1444 Last data filed at 11/08/13 1300  Gross per 24 hour  Intake    340 ml  Output    200 ml  Net    140 ml   Filed Vitals:   11/08/13 0511 11/08/13 0900 11/08/13 1312 11/08/13 1356  BP: 187/141 159/70 159/83 140/73  Pulse: 77 70 74 81  Temp: 98 F (36.7 C) 97.7 F (36.5 C) 97.9 F (36.6 C) 98 F (36.7 C)  TempSrc: Oral Axillary Oral Tympanic  Resp: $Remo'17 18 18 18  'LsbFe$ Weight:      SpO2: 97% 99% 99% 99%    General Appearance:not arousable this morning when I rounded but by nursing report is awake and eating and conversive and calm Lungs: Clear to auscultation bilaterally, respirations unlabored Heart: Regular rate and rhythm, S1 and S2 normal, no murmur, rub or gallop Abdomen: Soft, non-tender, bowel sounds active all four quadrants, no masses, no organomegaly Extremities: Extremities normal, atraumatic, no cyanosis or edema Neuro: Sleeping this morning, nonfocal by nurse report currently  Lab Results: Results for orders placed during the hospital encounter of 11/06/13 (from the past 48 hour(s))  CBC WITH DIFFERENTIAL     Status: Abnormal   Collection Time    11/06/13  7:33 PM      Result Value Ref Range   WBC 3.8 (*) 4.0 - 10.5 K/uL   RBC 4.68  3.87 - 5.11 MIL/uL   Hemoglobin 12.5  12.0 - 15.0 g/dL   HCT 39.2  36.0 - 46.0 %   MCV 83.8  78.0 - 100.0 fL   MCH 26.7  26.0 - 34.0 pg   MCHC 31.9  30.0 - 36.0 g/dL   RDW 14.6  11.5 - 15.5 %   Platelets 191  150 - 400 K/uL   Neutrophils Relative %  50  43 - 77 %   Neutro Abs 1.9  1.7 - 7.7 K/uL   Lymphocytes Relative 41  12 - 46 %   Lymphs Abs 1.5  0.7 - 4.0 K/uL   Monocytes Relative 7  3 - 12 %   Monocytes Absolute 0.3  0.1 - 1.0 K/uL   Eosinophils Relative 2  0 - 5 %   Eosinophils Absolute 0.1  0.0 - 0.7 K/uL   Basophils Relative 0  0 - 1 %   Basophils Absolute 0.0  0.0 - 0.1 K/uL  COMPREHENSIVE METABOLIC PANEL     Status: Abnormal   Collection Time    11/06/13  7:33 PM      Result Value Ref Range   Sodium 142  137 - 147 mEq/L   Potassium 3.9  3.7 - 5.3 mEq/L   Chloride 102  96 - 112 mEq/L   CO2 25  19 - 32 mEq/L   Glucose, Bld 186 (*) 70 - 99 mg/dL   BUN 21  6 - 23 mg/dL   Creatinine, Ser 1.21 (*) 0.50 - 1.10 mg/dL   Calcium 9.2  8.4 - 10.5 mg/dL   Total Protein 6.8  6.0 - 8.3 g/dL   Albumin 3.6  3.5 - 5.2 g/dL   AST 23  0 - 37 U/L   ALT 13  0 - 35 U/L   Alkaline Phosphatase 105  39 - 117 U/L   Total Bilirubin 0.6  0.3 - 1.2 mg/dL   GFR calc non Af Amer 40 (*) >90 mL/min   GFR calc Af Amer 46 (*) >90 mL/min   Comment: (NOTE)     The eGFR has been calculated using the CKD EPI equation.     This calculation has not been validated in all clinical situations.     eGFR's persistently <90 mL/min signify possible Chronic Kidney     Disease.  CBG MONITORING, ED     Status: Abnormal   Collection Time    11/06/13  9:07 PM      Result Value Ref Range   Glucose-Capillary 166 (*) 70 - 99 mg/dL   Comment 1 Documented in Chart     Comment 2 Notify RN    HEPATIC FUNCTION PANEL     Status: None   Collection Time    11/06/13  9:33 PM      Result Value Ref Range   Total Protein 6.8  6.0 - 8.3 g/dL   Albumin 3.6  3.5 - 5.2 g/dL   AST 21  0 - 37 U/L   ALT 12  0 - 35 U/L   Alkaline Phosphatase 100  39 - 117 U/L   Total Bilirubin 0.5  0.3 - 1.2 mg/dL   Bilirubin, Direct <0.2  0.0 - 0.3 mg/dL   Indirect Bilirubin NOT CALCULATED  0.3 - 0.9 mg/dL  Randolm Idol, ED     Status: None   Collection Time    11/06/13  9:47 PM       Result Value Ref Range   Troponin i, poc 0.01  0.00 - 0.08 ng/mL   Comment 3            Comment: Due to the release kinetics of cTnI,     a negative result within the first hours     of the onset of symptoms does not rule out     myocardial infarction with certainty.     If myocardial infarction is still suspected,     repeat the test at appropriate intervals.  URINALYSIS, ROUTINE W REFLEX MICROSCOPIC     Status: Abnormal   Collection Time    11/06/13 11:31 PM      Result Value Ref Range   Color, Urine YELLOW  YELLOW   APPearance CLEAR  CLEAR   Specific Gravity, Urine 1.027  1.005 - 1.030   pH 5.5  5.0 - 8.0   Glucose, UA NEGATIVE  NEGATIVE mg/dL   Hgb urine dipstick NEGATIVE  NEGATIVE   Bilirubin Urine SMALL (*) NEGATIVE   Ketones, ur NEGATIVE  NEGATIVE mg/dL   Protein, ur NEGATIVE  NEGATIVE mg/dL   Urobilinogen, UA 1.0  0.0 - 1.0 mg/dL   Nitrite NEGATIVE  NEGATIVE   Leukocytes, UA NEGATIVE  NEGATIVE   Comment: MICROSCOPIC NOT DONE ON URINES WITH NEGATIVE PROTEIN, BLOOD, LEUKOCYTES, NITRITE, OR GLUCOSE <1000 mg/dL.  CBC     Status: Abnormal   Collection Time    11/07/13  4:00 AM      Result Value Ref Range   WBC 4.4  4.0 - 10.5 K/uL   RBC 4.41  3.87 - 5.11 MIL/uL   Hemoglobin 11.7 (*) 12.0 - 15.0 g/dL  HCT 36.4  36.0 - 46.0 %   MCV 82.5  78.0 - 100.0 fL   MCH 26.5  26.0 - 34.0 pg   MCHC 32.1  30.0 - 36.0 g/dL   RDW 14.3  11.5 - 15.5 %   Platelets 186  150 - 400 K/uL  COMPREHENSIVE METABOLIC PANEL     Status: Abnormal   Collection Time    11/07/13  4:00 AM      Result Value Ref Range   Sodium 142  137 - 147 mEq/L   Potassium 3.6 (*) 3.7 - 5.3 mEq/L   Chloride 103  96 - 112 mEq/L   CO2 26  19 - 32 mEq/L   Glucose, Bld 158 (*) 70 - 99 mg/dL   BUN 19  6 - 23 mg/dL   Creatinine, Ser 1.13 (*) 0.50 - 1.10 mg/dL   Calcium 9.2  8.4 - 10.5 mg/dL   Total Protein 6.2  6.0 - 8.3 g/dL   Albumin 3.1 (*) 3.5 - 5.2 g/dL   AST 20  0 - 37 U/L   ALT 11  0 - 35 U/L   Alkaline  Phosphatase 92  39 - 117 U/L   Total Bilirubin 0.6  0.3 - 1.2 mg/dL   GFR calc non Af Amer 43 (*) >90 mL/min   GFR calc Af Amer 50 (*) >90 mL/min   Comment: (NOTE)     The eGFR has been calculated using the CKD EPI equation.     This calculation has not been validated in all clinical situations.     eGFR's persistently <90 mL/min signify possible Chronic Kidney     Disease.  APTT     Status: None   Collection Time    11/07/13  4:00 AM      Result Value Ref Range   aPTT 26  24 - 37 seconds  PROTIME-INR     Status: None   Collection Time    11/07/13  4:00 AM      Result Value Ref Range   Prothrombin Time 12.9  11.6 - 15.2 seconds   INR 0.99  0.00 - 1.49  GLUCOSE, CAPILLARY     Status: Abnormal   Collection Time    11/07/13  6:43 AM      Result Value Ref Range   Glucose-Capillary 121 (*) 70 - 99 mg/dL  GLUCOSE, CAPILLARY     Status: Abnormal   Collection Time    11/08/13  6:05 AM      Result Value Ref Range   Glucose-Capillary 107 (*) 70 - 99 mg/dL    Studies/Results: Ct Head Wo Contrast  11/06/2013   CLINICAL DATA:  Elevated blood pressure, confusion, headache  EXAM: CT HEAD WITHOUT CONTRAST  TECHNIQUE: Contiguous axial images were obtained from the base of the skull through the vertex without intravenous contrast.  COMPARISON:  CT HEAD W/O CM dated 08/09/2013; CT HEAD W/O CM dated 01/12/2012  FINDINGS: Mild to moderate diffuse atrophy. Moderate low attenuation in the deep white matter. Basal ganglia calcifications. Calcification of the anterior falx. No hydrocephalus, hemorrhage, or extra-axial fluid. No evidence of vascular territory infarct. Calvarium is intact.  IMPRESSION: Moderate chronic involutional change.  No acute findings.   Electronically Signed   By: Skipper Cliche M.D.   On: 11/06/2013 22:07   Mr Jeri Cos NW Contrast  11/07/2013   CLINICAL DATA:  Mental status change  EXAM: MRI HEAD WITHOUT AND WITH CONTRAST  TECHNIQUE: Multiplanar, multiecho pulse sequences of the  brain  and surrounding structures were obtained without and with intravenous contrast.  CONTRAST:  15 mL MultiHance IV  COMPARISON:  CT head 11/06/2013  FINDINGS: Moderate generalized atrophy. Periventricular deep white matter hyperintensity throughout the cerebral white matter bilaterally, consistent with chronic microvascular ischemia. Chronic ischemia in the basal ganglia bilaterally. 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 Brainstem and cerebellum are intact.  Negative for acute infarct.  Chronic microhemorrhage right posterior temporal lobe and left parietal lobe. Negative for mass or edema.  Postcontrast imaging reveals normal enhancement. No enhancing mass lesion is identified.  Mucous retention cyst left maxillary sinus. Remainder of the sinuses are clear.  IMPRESSION: Atrophy and chronic microvascular ischemia.  No acute abnormality.   Electronically Signed   By: Franchot Gallo M.D.   On: 11/07/2013 08:54   Dg Chest Portable 1 View  11/07/2013   CLINICAL DATA:  Hypertension.  EXAM: PORTABLE CHEST - 1 VIEW  COMPARISON:  08/09/2013  FINDINGS: Mild cardiomegaly which is chronic. There is unchanged prominence of the ascending aorta which is accentuated by rotation on the lower image. Stable appearance of the right hilum since 2012 at least. No edema or consolidation. No effusion or pneumothorax.  IMPRESSION: Stable exam.   No active disease.   Electronically Signed   By: Jorje Guild M.D.   On: 11/07/2013 00:24   Medications: Scheduled Meds: . amLODipine  5 mg Oral Daily  . aspirin EC  81 mg Oral Daily  . cloNIDine  0.2 mg Transdermal Weekly  . diltiazem  240 mg Oral Daily  . folic acid  1 mg Oral Daily  . glimepiride  2 mg Oral Q breakfast  . heparin  5,000 Units Subcutaneous 3 times per day  . [START ON 11/09/2013] influenza vac split quadrivalent PF  0.5 mL Intramuscular Tomorrow-1000  . isosorbide mononitrate  30 mg Oral Daily  . losartan  50 mg Oral Daily  . multivitamin with minerals  1 tablet  Oral Daily  . senna  1 tablet Oral BID  . thiamine  100 mg Oral Daily   Continuous Infusions: . dextrose 5 % and 0.45% NaCl Stopped (11/07/13 1700)   PRN Meds:.acetaminophen, acetaminophen, alum & mag hydroxide-simeth, bisacodyl, haloperidol, haloperidol lactate, hydrALAZINE, ondansetron (ZOFRAN) IV, ondansetron  Assessment/Plan: Principal Problem:   Accelerated hypertension Active Problems:   DM (diabetes mellitus)   Impaired cognition  1. Accelerated Hypertension -likely related to not taking her medications - improved on current regimen             -Continue telemetry bed             -MRI of brain without stroke 2. Diabetes  -Continue her amaryl  -monitor CBG and cover as needed  -carb modified diet  3. Impaired cognition            -appears to be steady decline per son            -Quite possibly related to hypertension - may improve             dramatically with a good blood pressure control  Code Status: Full Code (must indicate code status--if unknown or must be presumed, indicate so)   Family Communication: Son in room at time of admission and communicated with by admitting physician   Disposition Plan: Home (indicate anticipated LOS) - hopefully will improve today and be discharged friday    LOS: 2 days   Henrine Screws, MD 11/08/2013, 2:44 PM

## 2013-11-08 NOTE — Progress Notes (Signed)
Pt. BP elevated. No IV access d/t being discontinued by pt. Pt. Refusing PO medications. On call for MD, Debby Crosley, made aware. New orders received. RN will continue to monitor pt. For changes in condition. Samantha Bond, Cheryll DessertKaren Cherrell

## 2013-11-09 ENCOUNTER — Inpatient Hospital Stay (HOSPITAL_COMMUNITY): Payer: Medicare Other

## 2013-11-09 DIAGNOSIS — F09 Unspecified mental disorder due to known physiological condition: Secondary | ICD-10-CM

## 2013-11-09 DIAGNOSIS — E119 Type 2 diabetes mellitus without complications: Secondary | ICD-10-CM

## 2013-11-09 DIAGNOSIS — R4182 Altered mental status, unspecified: Secondary | ICD-10-CM

## 2013-11-09 LAB — BASIC METABOLIC PANEL
BUN: 14 mg/dL (ref 6–23)
BUN: 20 mg/dL (ref 6–23)
CALCIUM: 9.4 mg/dL (ref 8.4–10.5)
CHLORIDE: 101 meq/L (ref 96–112)
CO2: 23 meq/L (ref 19–32)
CO2: 25 mEq/L (ref 19–32)
CREATININE: 1.11 mg/dL — AB (ref 0.50–1.10)
CREATININE: 1.31 mg/dL — AB (ref 0.50–1.10)
Calcium: 9.4 mg/dL (ref 8.4–10.5)
Chloride: 98 mEq/L (ref 96–112)
GFR calc Af Amer: 51 mL/min — ABNORMAL LOW (ref >90)
GFR calc non Af Amer: 44 mL/min — ABNORMAL LOW (ref >90)
GFR, EST AFRICAN AMERICAN: 42 mL/min — AB (ref >90)
GFR, EST NON AFRICAN AMERICAN: 36 mL/min — AB (ref >90)
GLUCOSE: 87 mg/dL (ref 70–99)
Glucose, Bld: 136 mg/dL — ABNORMAL HIGH (ref 70–99)
Potassium: 3.8 mEq/L (ref 3.7–5.3)
Potassium: 4.2 mEq/L (ref 3.7–5.3)
Sodium: 140 mEq/L (ref 137–147)
Sodium: 139 mEq/L (ref 137–147)

## 2013-11-09 LAB — TROPONIN I

## 2013-11-09 LAB — GLUCOSE, CAPILLARY
GLUCOSE-CAPILLARY: 145 mg/dL — AB (ref 70–99)
Glucose-Capillary: 106 mg/dL — ABNORMAL HIGH (ref 70–99)

## 2013-11-09 MED ORDER — LOSARTAN POTASSIUM 50 MG PO TABS
100.0000 mg | ORAL_TABLET | Freq: Every day | ORAL | Status: DC
  Administered 2013-11-09 – 2013-11-13 (×4): 100 mg via ORAL
  Filled 2013-11-09 (×6): qty 2

## 2013-11-09 MED ORDER — CLONIDINE HCL 0.1 MG PO TABS
0.1000 mg | ORAL_TABLET | Freq: Two times a day (BID) | ORAL | Status: DC
  Administered 2013-11-09 – 2013-11-10 (×4): 0.1 mg via ORAL
  Filled 2013-11-09 (×6): qty 1

## 2013-11-09 MED ORDER — HYDROCHLOROTHIAZIDE 12.5 MG PO CAPS
12.5000 mg | ORAL_CAPSULE | Freq: Every day | ORAL | Status: DC
  Administered 2013-11-09 – 2013-11-16 (×6): 12.5 mg via ORAL
  Filled 2013-11-09 (×8): qty 1

## 2013-11-09 MED ORDER — SODIUM CHLORIDE 0.9 % IV SOLN: INTRAVENOUS | Status: DC

## 2013-11-09 NOTE — Procedures (Signed)
ELECTROENCEPHALOGRAM REPORT  Patient: Samantha Bond       Room #: [id number] EEG No. ID: [id number] Age: 80 y.o.        Sex: female Referring Physician: Wende Neighbors. Gates Report Date:  11/09/2013        Interpreting Physician: Aline BrochureSTEWART,Avelino Herren R  History: Samantha Bond is an 80 y.o. female admitted with altered mental status and possible stroke as well as markedly elevated blood pressure. Acute stroke was ruled out with MRI study. She was found unresponsive and incontinent of urine at 2 PM today.   Indications for study:  Rule out new onset seizure disorder.  Technique: This is an 18 channel routine scalp EEG performed at the bedside with bipolar and monopolar montages arranged in accordance to the international 10/20 system of electrode placement.   Description: Patient was noted to be confused and somewhat agitated during the study. Predominant background activity consisted of moderate amplitude 1-2 Hz irregular delta activity with superimposed 5-7 Hz theta activity diffusely. Photic stimulation was not performed. Hyperventilation was not performed. No epileptiform discharges were recorded. There were no areas of disproportionate focal slowing of cerebral activity.  Interpretation: This EEG is abnormal with continuous moderately severe generalized nonspecific slowing of cerebral activity. No evidence of an epileptic disorder was demonstrated.   Venetia MaxonR Paige Monarrez M.D. [facility] [phone]

## 2013-11-09 NOTE — Progress Notes (Signed)
1408 was called by respiratory theraherapist at the bedside , claimed that pt unresposive Pt  responsive only to very noxiou stimuli  With minimal inspiratory breathing , poor gag , RRt called .diaporetic warm to touch, pulses strongly palpale, Suctioned orally with some food particles . Placed at bedside  With headl flat on bed  Code neuro called ,accucheck done  And charted VS done 139/59  83 , RR=16 . RRT came in 02 applied 02sat 94% on room air . Pt more awake but still doesnt follow commands . RRt spoke  With neuro MD pt moved to radiology fot cars scan  NOTE ; found pt wet most likely incontinence of urine

## 2013-11-09 NOTE — Progress Notes (Signed)
1445 Back from cat scan Moans and groass at time . With RRT in attendance E Neuro MD examined the pt. 1500 pt moreresistive when touched and upon suctioning for food in mouth . occasionally  Speaks in clear  Words 1514 follow up call to Dr gates done  RRT

## 2013-11-09 NOTE — Progress Notes (Signed)
EEG Completed; Results Pending  

## 2013-11-09 NOTE — Progress Notes (Signed)
Subjective: Samantha Bond recognizes me readily today and is calm.  Usual sense of dry humor.  Was surprised that was Friday.  Can really remember too much about the hospitalization.  No shortness of breath or chest pain.  No focal weakness or numbness  Objective: Weight change:   Intake/Output Summary (Last 24 hours) at 11/09/13 0903 Last data filed at 11/09/13 0600  Gross per 24 hour  Intake    940 ml  Output    450 ml  Net    490 ml   Filed Vitals:   11/08/13 1312 11/08/13 1356 11/08/13 2034 11/09/13 0418  BP: 159/83 140/73 170/64 165/87  Pulse: 74 81 82 105  Temp: 97.9 F (36.6 C) 98 F (36.7 C) 97.2 F (36.2 C) 98.1 F (36.7 C)  TempSrc: Oral Tympanic Oral Oral  Resp: 18 18 18 18  Weight:    184 lb 11.9 oz (83.8 kg)  SpO2: 99% 99% 98% 97%    General Appearance: Alert, cooperative, no distress Lungs: Clear to auscultation bilaterally, respirations unlabored Heart: Regular rate and rhythm, S1 and S2 normal, no murmur, rub or gallop Abdomen: Soft, non-tender, bowel sounds active all four quadrants, no masses, no organomegaly Extremities: Extremities normal, atraumatic, no cyanosis or edema Neuro: Oriented x1, alert, conversive, nonfocal  Lab Results: Results for orders placed during the hospital encounter of 11/06/13 (from the past 48 hour(s))  GLUCOSE, CAPILLARY     Status: Abnormal   Collection Time    11/08/13  6:05 AM      Result Value Ref Range   Glucose-Capillary 107 (*) 70 - 99 mg/dL  BASIC METABOLIC PANEL     Status: Abnormal   Collection Time    11/09/13  4:07 AM      Result Value Ref Range   Sodium 140  137 - 147 mEq/L   Potassium 3.8  3.7 - 5.3 mEq/L   Chloride 101  96 - 112 mEq/L   CO2 23  19 - 32 mEq/L   Glucose, Bld 87  70 - 99 mg/dL   BUN 14  6 - 23 mg/dL   Creatinine, Ser 1.11 (*) 0.50 - 1.10 mg/dL   Calcium 9.4  8.4 - 10.5 mg/dL   GFR calc non Af Amer 44 (*) >90 mL/min   GFR calc Af Amer 51 (*) >90 mL/min   Comment: (NOTE)     The eGFR has  been calculated using the CKD EPI equation.     This calculation has not been validated in all clinical situations.     eGFR's persistently <90 mL/min signify possible Chronic Kidney     Disease.  GLUCOSE, CAPILLARY     Status: Abnormal   Collection Time    11/09/13  5:58 AM      Result Value Ref Range   Glucose-Capillary 106 (*) 70 - 99 mg/dL    Studies/Results: No results found. Medications: Scheduled Meds: . amLODipine  5 mg Oral Daily  . aspirin EC  81 mg Oral Daily  . cloNIDine  0.1 mg Oral BID  . diltiazem  240 mg Oral Daily  . folic acid  1 mg Oral Daily  . glimepiride  2 mg Oral Q breakfast  . heparin  5,000 Units Subcutaneous 3 times per day  . hydrochlorothiazide  12.5 mg Oral Daily  . influenza vac split quadrivalent PF  0.5 mL Intramuscular Tomorrow-1000  . isosorbide mononitrate  30 mg Oral Daily  . losartan  100 mg Oral Daily  .   multivitamin with minerals  1 tablet Oral Daily  . senna  1 tablet Oral BID  . thiamine  100 mg Oral Daily   Continuous Infusions: . dextrose 5 % and 0.45% NaCl Stopped (11/07/13 1700)   PRN Meds:.acetaminophen, acetaminophen, alum & mag hydroxide-simeth, bisacodyl, haloperidol, haloperidol lactate, hydrALAZINE, ondansetron (ZOFRAN) IV, ondansetron  Assessment/Plan: Principal Problem:   Accelerated hypertension Active Problems:   DM (diabetes mellitus)   Impaired cognition  Principal Problem:  Accelerated hypertension  Active Problems:  DM (diabetes mellitus)  Impaired cognition 1.Accelerated Hypertension -likely related to not taking her medications - improved on current regimen  -Continue telemetry bed  -MRI of brain without stroke  2. Diabetes  -Continue her amaryl  -monitor CBG and cover as needed  -carb modified diet  3. Impaired cognition  -appears to be steady declining per son, may improve dramatically with a good blood pressure control   Code Status: Full Code (must indicate code status--if unknown or must be  presumed, indicate so)   Family Communication: Son in room at time of admission and communicated with by admitting physician   Disposition Plan: Home (indicate anticipated LOS) - plans for discharge are Saturday morning, April 4 with home health to follow   LOS: 3 days   Henrine Screws, MD 11/09/2013, 9:03 AM

## 2013-11-09 NOTE — Progress Notes (Signed)
11/09/13 1500-1900 Pt.is A/Ox1 and is agitated and combative. Pt.had 3 episodes of urinary incontinence and was combative during linen changes. Pt.'s daughter and two sons were present and witnessed patient's behavior. IV team was called about pt's IV being occluded. Was told by IV team RN that IV team made multiple attempts to restart with the assistance of ultrasound but were unsuccessful. Pt.became so agitated that she was moved to a camera room to monitor more closely, pt.'s family were present during the move. PRN Haldol was given and pharmacy called to verify that it was okay to give IM since IV route wasn't available, was told that was okay. Family was present when patient received injection.Pt.also refused to wear telemetry monitor. MD on call was paged and notified. New orders were written. Telemetry d/c'd. Okay to leave out IV.

## 2013-11-09 NOTE — Progress Notes (Signed)
1524 Placed a  Call and spoken with Lacie DraftLiza Cummings , pt's daughter . Updated with progress

## 2013-11-09 NOTE — Progress Notes (Signed)
1625 EEG in progress at bedside. Pt asleep . withdraws extremeties  On bl;ood drawing and iv placement

## 2013-11-09 NOTE — Progress Notes (Addendum)
When entering room at 1353 pt was sitting EOB leaning to right with facial droop to right and vomit coming out of mouth. Pt was unresponsive with sternal rub; RN notified and RR team called for CODE stroke. Pt rolled onto right side with RN for suction and was continuing to vomit. Pt appeared to have voided; bed wet. Pt transferred to CT; will need new therapy orders when pt is medically stable. [place], [place], [place] [phone]

## 2013-11-09 NOTE — Significant Event (Signed)
Rapid Response Event Note  Overview: Time Called: 2591 Arrival Time: 0289 Event Type: Neurologic  Initial Focused Assessment: Per Staff patient LKW at 1315, sitting on the side of the bed eating lunch.  About 1400 Physical Therapist found patient lying in bed, slumped to her right, drooling and food in her mouth and on her chest.  She has also bit her lip. She is unresponsive to voice or sternal rub. Initially she would withdraw on the left but not on the right. She has had loss of bowel and bladder. BP 157/65  SR 69  RR 20 O2 sat 94% on RA.   Interventions: Code Stroke called Suctioned mouth, difficult to clear mouth of all the food. Placed HOB flat, with patient slightly on her side. Transported patient to Radiology for stat Head CT Dr Nicole Kindred met patient in CT Patient moving both her right and left arm, but not responding to staff. 1455 returned to room:  Patient intermitantly hollering out then lying in bed unresponsive.  Patient not following commands but is able to move her right and left arm equally. Dr Sonnet Caul notified of events, also notified of concern that patient aspirated.  Will obtain PCXR and place patient on continuous pulse ox.   1600 EEG being done Will continue to monitor patient, RN to call if assistance needed.    Event Summary: Name of Physician Notified: Dr Terence Caul at 1515  Name of Consulting Physician Notified: Dr Nicole Kindred at 1410  Outcome: Stayed in room and stabalized     Raliegh Ip

## 2013-11-09 NOTE — Consult Note (Signed)
Reason for Consult: Patient found unresponsive and incontinent of urine.  HPI:                                                                                                                                          Samantha Bond is an 80 y.o. female Mr. diabetes mellitus, hypertension, aortic sclerosis, spinal stenosis and DJD who was admitted on 11/06/2013 altered mental status and hypertension as well as to rule out possible acute stroke. Subsequent MRI showed no signs of acute stroke. Mental status has been improving. Patient was found unresponsive at 2 PM this afternoon. She was last seen responsive and at her baseline mental status at 1:15 PM. CT scan of her head was obtained which showed no acute intracranial abnormality. Patient was noted to be incontinent of urine when found unconscious. She subsequently vomited several times. No clear focal weakness was noted. Patient began to slowly become more responsive, although remained confused. There is no history of seizure disorder. No frank seizure activity has been witnessed. Code stroke was activated but subsequently canceled.  Past Medical History  Diagnosis Date  . Hypertension   . Diabetes mellitus   . DJD (degenerative joint disease)   . Peripheral neuropathy   . Mild aortic sclerosis   . Spinal stenosis   . Diverticulosis   . DJD (degenerative joint disease)     knees, back & cervical    Past Surgical History  Procedure Laterality Date  . Cholecystectomy      No family history on file.  Social History:  reports that she has never smoked. She does not have any smokeless tobacco history on file. She reports that she does not drink alcohol or use illicit drugs.  Allergies  Allergen Reactions  . Benicar [Olmesartan] Other (See Comments)    arthralgias  . Lyrica [Pregabalin] Other (See Comments)    Lethargy & blurred vision  . Metformin And Related Diarrhea and Other (See Comments)    Elevated pancreatic enzymes  .  Toprol Xl [Metoprolol] Other (See Comments)    Asthma  . Vicodin [Hydrocodone-Acetaminophen] Nausea Only    MEDICATIONS:                                                                                                                     I have reviewed the patient's current medications.   ROS:  History obtained from chart review  General ROS: negative for - chills, fatigue, fever, night sweats, weight gain or weight loss Psychological ROS: Noted in H&P  Ophthalmic ROS: negative for - blurry vision, double vision, eye pain or loss of vision ENT ROS: negative for - epistaxis, nasal discharge, oral lesions, sore throat, tinnitus or vertigo Allergy and Immunology ROS: negative for - hives or itchy/watery eyes Hematological and Lymphatic ROS: negative for - bleeding problems, bruising or swollen lymph nodes Endocrine ROS: negative for - galactorrhea, hair pattern changes, polydipsia/polyuria or temperature intolerance Respiratory ROS: negative for - cough, hemoptysis, shortness of breath or wheezing Cardiovascular ROS: negative for - chest pain, dyspnea on exertion, edema or irregular heartbeat Gastrointestinal ROS: negative for - abdominal pain, diarrhea, hematemesis, nausea/vomiting or stool incontinence Genito-Urinary ROS: negative for - dysuria, hematuria, incontinence or urinary frequency/urgency Musculoskeletal ROS: negative for - joint swelling or muscular weakness Neurological ROS: as noted in HPI Dermatological ROS: negative for rash and skin lesion changes   Blood pressure 143/65, pulse 72, temperature 98.2 F (36.8 C), temperature source Oral, resp. rate 20, weight 83.8 kg (184 lb 11.9 oz), SpO2 100.00%.   Neurologic Examination:                                                                                                      Patient was  obtunded but responded to external stimuli with withdrawal and slight combativeness. Pupils were equal and reacted normally to light. Extraocular movements were intact and full and conjugate on right and left lateral gaze. No facial weakness noted. Speech was moderately dysarthric. Patient moved extremities equally with normal strength of upper and lower extremities. Deep tendon reflexes were 1+ and symmetrical. Plantar responses were flexor bilaterally.  Lab Results  Component Value Date/Time   CHOL 232* 01/14/2012  8:30 AM    Results for orders placed during the hospital encounter of 11/06/13 (from the past 48 hour(s))  GLUCOSE, CAPILLARY     Status: Abnormal   Collection Time    11/08/13  6:05 AM      Result Value Ref Range   Glucose-Capillary 107 (*) 70 - 99 mg/dL  BASIC METABOLIC PANEL     Status: Abnormal   Collection Time    11/09/13  4:07 AM      Result Value Ref Range   Sodium 140  137 - 147 mEq/L   Potassium 3.8  3.7 - 5.3 mEq/L   Chloride 101  96 - 112 mEq/L   CO2 23  19 - 32 mEq/L   Glucose, Bld 87  70 - 99 mg/dL   BUN 14  6 - 23 mg/dL   Creatinine, Ser 1.11 (*) 0.50 - 1.10 mg/dL   Calcium 9.4  8.4 - 10.5 mg/dL   GFR calc non Af Amer 44 (*) >90 mL/min   GFR calc Af Amer 51 (*) >90 mL/min   Comment: (NOTE)     The eGFR has been calculated using the CKD EPI equation.     This calculation has not been validated in all clinical situations.  eGFR's persistently <90 mL/min signify possible Chronic Kidney     Disease.  GLUCOSE, CAPILLARY     Status: Abnormal   Collection Time    11/09/13  5:58 AM      Result Value Ref Range   Glucose-Capillary 106 (*) 70 - 99 mg/dL  GLUCOSE, CAPILLARY     Status: Abnormal   Collection Time    11/09/13  2:12 PM      Result Value Ref Range   Glucose-Capillary 145 (*) 70 - 99 mg/dL    Ct Head Wo Contrast  11/09/2013   CLINICAL DATA:  Code stroke.  Sudden change in neuro status.  EXAM: CT HEAD WITHOUT CONTRAST  TECHNIQUE:  Contiguous axial images were obtained from the base of the skull through the vertex without intravenous contrast.  COMPARISON:  Brain MRI, 11/07/2013.  Head CT, 11/06/2013.  FINDINGS: Ventricles are normal in configuration. There is ventricular and sulcal enlargement reflecting moderate atrophy. No hydrocephalus.  Patchy white matter hypoattenuation is noted consistent with advanced chronic microvascular ischemic change. There is evidence of several superimposed old deep white matter lacunar infarcts.  There is no evidence of a recent cortical infarct.  There are no extra-axial masses or abnormal fluid collections.  There is no intracranial hemorrhage.  Visualized sinuses and mastoid air cells are clear.  IMPRESSION: 1. No acute intracranial abnormalities. 2. Atrophy and advanced chronic microvascular ischemic change, stable from the prior exams. These results were called by telephone at the time of interpretation on 11/09/2013 at 2:39 PM to Dr. Nicole Kindred , who verbally acknowledged these results.   Electronically Signed   By: Lajean Manes M.D.   On: 11/09/2013 14:42   Assessment/Plan: Acute change in mental status with initial unconsciousness followed by stuporous state and confusion and agitation. Etiology is unclear. CT scan showed no acute intracranial abnormality. Clinical exam shows no signs of acute stroke. Generalized seizure with postictal state cannot be ruled out.  Plan: 1. Stat troponin and BNP 2. Stat EEG to rule out epileptic activity as well as assess severity of encephalopathy. 3. No anticonvulsant medication unless patient has a witnessed seizure or EEG shows indications of increased risk for seizure activity.  We will continue to follow this patient with you.  C.R. Nicole Kindred, MD [facility] [phone]  11/09/2013, 2:57 PM

## 2013-11-10 LAB — BASIC METABOLIC PANEL
BUN: 21 mg/dL (ref 6–23)
CO2: 23 mEq/L (ref 19–32)
CREATININE: 1.39 mg/dL — AB (ref 0.50–1.10)
Calcium: 8.8 mg/dL (ref 8.4–10.5)
Chloride: 100 mEq/L (ref 96–112)
GFR, EST AFRICAN AMERICAN: 39 mL/min — AB (ref >90)
GFR, EST NON AFRICAN AMERICAN: 34 mL/min — AB (ref >90)
Glucose, Bld: 105 mg/dL — ABNORMAL HIGH (ref 70–99)
Potassium: 4.2 mEq/L (ref 3.7–5.3)
Sodium: 139 mEq/L (ref 137–147)

## 2013-11-10 LAB — GLUCOSE, CAPILLARY: Glucose-Capillary: 104 mg/dL — ABNORMAL HIGH (ref 70–99)

## 2013-11-10 MED ORDER — LORAZEPAM 1 MG PO TABS
1.0000 mg | ORAL_TABLET | Freq: Four times a day (QID) | ORAL | Status: DC | PRN
  Administered 2013-11-11 – 2013-11-13 (×2): 1 mg via ORAL
  Filled 2013-11-10 (×3): qty 1

## 2013-11-10 MED ORDER — RISPERIDONE 0.5 MG PO TBDP
0.2500 mg | ORAL_TABLET | Freq: Two times a day (BID) | ORAL | Status: DC
  Administered 2013-11-10 (×2): 0.25 mg via ORAL
  Filled 2013-11-10 (×4): qty 0.5

## 2013-11-10 MED ORDER — LORAZEPAM 2 MG/ML IJ SOLN
1.0000 mg | Freq: Four times a day (QID) | INTRAMUSCULAR | Status: DC | PRN
  Filled 2013-11-10: qty 1

## 2013-11-10 NOTE — Progress Notes (Addendum)
Subjective: Yesterday morning, patient was doing well but then had a an episode of unconsciousness and after becoming arousable later in the day yesterday, has been confused and agitated since.    I also spoke with the patient's son, Doree Albee, and he said that she had a similar episode of loss of consciousness apparently without any seizure activity noted by the family about 2 months ago and EMS was called and she was evaluated in the emergency roomand was sent home after evaluation.    EEG performed yesterday and neuro consult obtained.  No epileptiform activity but diffuse slowing was noted.  MRI on admission revealed diffuse atrophy with diffuse microvascular disease and an old thalamic stroke but nothing acute.  Repeat MRI has been planned.  Currently, patient is paranoid and suspicious of any kind of care that might be given to her today.  This happened during a prior hospitalization as well.  Seizure activity was not noted at the time of her unresponsive episode.  No dysrhythmias were noted.  Oxygen saturations currently are in the low 80s.  Will screen for PE with d-dimer so she is not having respiratory distress.  Suspect this is more of a monitor problem with the pulse ox machine.  Objective: Weight change: -3 lb 3.9 oz (-1.472 kg)  Intake/Output Summary (Last 24 hours) at 11/10/13 1046 Last data filed at 11/10/13 0500  Gross per 24 hour  Intake      0 ml  Output   1025 ml  Net  -1025 ml   Filed Vitals:   11/10/13 0048 11/10/13 0234 11/10/13 0450 11/10/13 0500  BP: 141/72 121/72 127/65   Pulse: 78 68 74   Temp:   98 F (36.7 C)   TempSrc:   Oral   Resp: $Remo'18 16 18   'FsxTZ$ Weight:    181 lb 8 oz (82.328 kg)  SpO2: 100% 97% 99%     General Appearance: Alert, cooperative, no distress, appears stated age Lungs: Clear to auscultation bilaterally, respirations unlabored Heart: Regular rate and rhythm, S1 and S2 normal, no murmur, rub or gallop Abdomen: Soft, non-tender, bowel sounds  active all four quadrants, no masses, no organomegaly Extremities: Extremities normal, atraumatic, no cyanosis or edema Neuro: Oriented to name and briefly remembered that she was in [place] to go home.  Nonfocal exam.  Lab Results: Results for orders placed during the hospital encounter of 11/06/13 (from the past 48 hour(s))  BASIC METABOLIC PANEL     Status: Abnormal   Collection Time    11/09/13  4:07 AM      Result Value Ref Range   Sodium 140  137 - 147 mEq/L   Potassium 3.8  3.7 - 5.3 mEq/L   Chloride 101  96 - 112 mEq/L   CO2 23  19 - 32 mEq/L   Glucose, Bld 87  70 - 99 mg/dL   BUN 14  6 - 23 mg/dL   Creatinine, Ser 1.11 (*) 0.50 - 1.10 mg/dL   Calcium 9.4  8.4 - 10.5 mg/dL   GFR calc non Af Amer 44 (*) >90 mL/min   GFR calc Af Amer 51 (*) >90 mL/min   Comment: (NOTE)     The eGFR has been calculated using the CKD EPI equation.     This calculation has not been validated in all clinical situations.     eGFR's persistently <90 mL/min signify possible Chronic Kidney     Disease.  GLUCOSE, CAPILLARY  Status: Abnormal   Collection Time    11/09/13  5:58 AM      Result Value Ref Range   Glucose-Capillary 106 (*) 70 - 99 mg/dL  GLUCOSE, CAPILLARY     Status: Abnormal   Collection Time    11/09/13  2:12 PM      Result Value Ref Range   Glucose-Capillary 145 (*) 70 - 99 mg/dL  BASIC METABOLIC PANEL     Status: Abnormal   Collection Time    11/09/13  3:51 PM      Result Value Ref Range   Sodium 139  137 - 147 mEq/L   Potassium 4.2  3.7 - 5.3 mEq/L   Chloride 98  96 - 112 mEq/L   CO2 25  19 - 32 mEq/L   Glucose, Bld 136 (*) 70 - 99 mg/dL   BUN 20  6 - 23 mg/dL   Creatinine, Ser 1.31 (*) 0.50 - 1.10 mg/dL   Calcium 9.4  8.4 - 10.5 mg/dL   GFR calc non Af Amer 36 (*) >90 mL/min   GFR calc Af Amer 42 (*) >90 mL/min   Comment: (NOTE)     The eGFR has been calculated using the CKD EPI equation.     This calculation has not been validated in  all clinical situations.     eGFR's persistently <90 mL/min signify possible Chronic Kidney     Disease.  TROPONIN I     Status: None   Collection Time    11/09/13  3:51 PM      Result Value Ref Range   Troponin I <0.30  <0.30 ng/mL   Comment:            Due to the release kinetics of cTnI,     a negative result within the first hours     of the onset of symptoms does not rule out     myocardial infarction with certainty.     If myocardial infarction is still suspected,     repeat the test at appropriate intervals.  BASIC METABOLIC PANEL     Status: Abnormal   Collection Time    11/10/13  3:46 AM      Result Value Ref Range   Sodium 139  137 - 147 mEq/L   Potassium 4.2  3.7 - 5.3 mEq/L   Chloride 100  96 - 112 mEq/L   CO2 23  19 - 32 mEq/L   Glucose, Bld 105 (*) 70 - 99 mg/dL   BUN 21  6 - 23 mg/dL   Creatinine, Ser 1.39 (*) 0.50 - 1.10 mg/dL   Calcium 8.8  8.4 - 10.5 mg/dL   GFR calc non Af Amer 34 (*) >90 mL/min   GFR calc Af Amer 39 (*) >90 mL/min   Comment: (NOTE)     The eGFR has been calculated using the CKD EPI equation.     This calculation has not been validated in all clinical situations.     eGFR's persistently <90 mL/min signify possible Chronic Kidney     Disease.  GLUCOSE, CAPILLARY     Status: Abnormal   Collection Time    11/10/13  5:37 AM      Result Value Ref Range   Glucose-Capillary 104 (*) 70 - 99 mg/dL    Studies/Results: Ct Head Wo Contrast  11/09/2013   CLINICAL DATA:  Code stroke.  Sudden change in neuro status.  EXAM: CT HEAD WITHOUT CONTRAST  TECHNIQUE: Contiguous axial images  were obtained from the base of the skull through the vertex without intravenous contrast.  COMPARISON:  Brain MRI, 11/07/2013.  Head CT, 11/06/2013.  FINDINGS: Ventricles are normal in configuration. There is ventricular and sulcal enlargement reflecting moderate atrophy. No hydrocephalus.  Patchy white matter hypoattenuation is noted consistent with advanced chronic  microvascular ischemic change. There is evidence of several superimposed old deep white matter lacunar infarcts.  There is no evidence of a recent cortical infarct.  There are no extra-axial masses or abnormal fluid collections.  There is no intracranial hemorrhage.  Visualized sinuses and mastoid air cells are clear.  IMPRESSION: 1. No acute intracranial abnormalities. 2. Atrophy and advanced chronic microvascular ischemic change, stable from the prior exams. These results were called by telephone at the time of interpretation on 11/09/2013 at 2:39 PM to Dr. Nicole Kindred , who verbally acknowledged these results.   Electronically Signed   By: Lajean Manes M.D.   On: 11/09/2013 14:42   Dg Chest Port 1 View  11/09/2013   CLINICAL DATA:  Possible aspiration  EXAM: PORTABLE CHEST - 1 VIEW  COMPARISON:  11/06/2013  FINDINGS: The heart size and mediastinal contours are within normal limits. Both lungs are clear. The visualized skeletal structures are unremarkable.  IMPRESSION: No active disease.   Electronically Signed   By: Inez Catalina M.D.   On: 11/09/2013 19:10   Medications: Scheduled Meds: . amLODipine  5 mg Oral Daily  . aspirin EC  81 mg Oral Daily  . cloNIDine  0.1 mg Oral BID  . diltiazem  240 mg Oral Daily  . folic acid  1 mg Oral Daily  . glimepiride  2 mg Oral Q breakfast  . heparin  5,000 Units Subcutaneous 3 times per day  . hydrochlorothiazide  12.5 mg Oral Daily  . influenza vac split quadrivalent PF  0.5 mL Intramuscular Tomorrow-1000  . isosorbide mononitrate  30 mg Oral Daily  . losartan  100 mg Oral Daily  . multivitamin with minerals  1 tablet Oral Daily  . senna  1 tablet Oral BID  . thiamine  100 mg Oral Daily   Continuous Infusions: . sodium chloride    . dextrose 5 % and 0.45% NaCl Stopped (11/07/13 1700)   PRN Meds:.acetaminophen, acetaminophen, alum & mag hydroxide-simeth, bisacodyl, haloperidol, haloperidol lactate, hydrALAZINE, ondansetron (ZOFRAN) IV,  ondansetron  Assessment/Plan: Principal Problem:   Accelerated hypertension - controlled Active Problems:   DM (diabetes mellitus) - controlled   Impaired cognition - it's better yesterday prior to loss of consciousness.  Wonder if she may be postictal though no seizure activity was noted on EEG.  Appreciate help from neurology.  MRI pending.  Hopefully we can repeat this without her objection   Altered mental status - clearly delirious and paranoid today.  Will start Risperdal 0.25 milligrams twice daily   Episode of unconsciousness - etiology very unclear.  MRI pending.  Appreciate neurology following along   ? Hypoxemia by pulse ox - check d-dimer   LOS: 4 days   Henrine Screws, MD 11/10/2013, 10:46 AM

## 2013-11-10 NOTE — Progress Notes (Addendum)
Client is uncoperative displays behavior that she is afraid someone is trying to hurt her.  Sitter at bedside.  Ambulating in hallway with sitter.

## 2013-11-10 NOTE — Progress Notes (Signed)
Subjective: Patient has remained confused and at times agitated requiring sedation. Original no recurrence of loss of consciousness. No seizure activity reported.  Objective: Current vital signs: BP 127/65  Pulse 74  Temp(Src) 98 F (36.7 C) (Oral)  Resp 18  Wt 82.328 kg (181 lb 8 oz)  SpO2 99%  Neurologic Exam: Patient was somnolent and difficult to arouse. She couldn't be aroused briefly and followed simple commands appropriately. Pupils were equal and reacted normally to light. Extraocular movements were intact and conjugate on right and left lateral gaze. No facial weakness noted. Speech was slurred but commensurate with level of alertness. Patient moved extremities equally with normal strength throughout.  Medications: I have reviewed the patient's current medications.  Assessment/Plan: Altered mental status of unclear etiology. Patient had an episode of unconsciousness yesterday during which she was incontinent of urine. Episode onset was not witnessed. Seizure cannot be ruled out. CT scan of the head was unremarkable for acute changes. EEG showed generalized slowing with no epileptiform activity. Postictal state was suspected.  A lower a repeat MRI of the brain to rule out possible acute stroke. No changes in current management recommended. No anticonvulsant medication recommended unless patient has an unequivocal witnessed seizure.  We will continue to follow this patient with you.  C.R. Roseanne RenoStewart, MD [facility] [phone]  11/10/2013  9:01 AM

## 2013-11-11 DIAGNOSIS — R55 Syncope and collapse: Secondary | ICD-10-CM

## 2013-11-11 DIAGNOSIS — I674 Hypertensive encephalopathy: Secondary | ICD-10-CM

## 2013-11-11 LAB — COMPREHENSIVE METABOLIC PANEL
ALBUMIN: 3.2 g/dL — AB (ref 3.5–5.2)
ALK PHOS: 87 U/L (ref 39–117)
ALT: 12 U/L (ref 0–35)
AST: 23 U/L (ref 0–37)
BUN: 35 mg/dL — ABNORMAL HIGH (ref 6–23)
CHLORIDE: 98 meq/L (ref 96–112)
CO2: 23 mEq/L (ref 19–32)
Calcium: 9.4 mg/dL (ref 8.4–10.5)
Creatinine, Ser: 1.84 mg/dL — ABNORMAL HIGH (ref 0.50–1.10)
GFR calc Af Amer: 28 mL/min — ABNORMAL LOW (ref >90)
GFR calc non Af Amer: 24 mL/min — ABNORMAL LOW (ref >90)
Glucose, Bld: 87 mg/dL (ref 70–99)
POTASSIUM: 4.1 meq/L (ref 3.7–5.3)
SODIUM: 138 meq/L (ref 137–147)
TOTAL PROTEIN: 6.7 g/dL (ref 6.0–8.3)
Total Bilirubin: 0.6 mg/dL (ref 0.3–1.2)

## 2013-11-11 LAB — CBC WITH DIFFERENTIAL/PLATELET
BASOS ABS: 0 10*3/uL (ref 0.0–0.1)
Basophils Relative: 1 % (ref 0–1)
EOS ABS: 0.1 10*3/uL (ref 0.0–0.7)
Eosinophils Relative: 2 % (ref 0–5)
HCT: 39.1 % (ref 36.0–46.0)
Hemoglobin: 12.5 g/dL (ref 12.0–15.0)
Lymphocytes Relative: 54 % — ABNORMAL HIGH (ref 12–46)
Lymphs Abs: 2.3 10*3/uL (ref 0.7–4.0)
MCH: 26.4 pg (ref 26.0–34.0)
MCHC: 32 g/dL (ref 30.0–36.0)
MCV: 82.5 fL (ref 78.0–100.0)
Monocytes Absolute: 0.5 10*3/uL (ref 0.1–1.0)
Monocytes Relative: 12 % (ref 3–12)
NEUTROS ABS: 1.3 10*3/uL — AB (ref 1.7–7.7)
Neutrophils Relative %: 31 % — ABNORMAL LOW (ref 43–77)
PLATELETS: 190 10*3/uL (ref 150–400)
RBC: 4.74 MIL/uL (ref 3.87–5.11)
RDW: 14.6 % (ref 11.5–15.5)
WBC: 4.2 10*3/uL (ref 4.0–10.5)

## 2013-11-11 LAB — GLUCOSE, CAPILLARY
Glucose-Capillary: 71 mg/dL (ref 70–99)
Glucose-Capillary: 99 mg/dL (ref 70–99)

## 2013-11-11 MED ORDER — RISPERIDONE 0.5 MG PO TBDP
0.2500 mg | ORAL_TABLET | Freq: Two times a day (BID) | ORAL | Status: DC | PRN
  Filled 2013-11-11: qty 0.5

## 2013-11-11 MED ORDER — CLONIDINE HCL 0.1 MG PO TABS
0.1000 mg | ORAL_TABLET | Freq: Every day | ORAL | Status: DC
  Administered 2013-11-11 – 2013-11-12 (×2): 0.1 mg via ORAL
  Filled 2013-11-11 (×3): qty 1

## 2013-11-11 NOTE — Progress Notes (Addendum)
Subjective: Patient is awake today but still confused.  Currently she is calm.  Does not know where she is and does not remember my name. Somewhat sedated.  Sitting up trying to read the paper.  Does not know how long she's been here.  Was very paranoid last night and agitated.  Would not take by mouth meds for quite some time yesterday.  Improving slowly with blood pressure control  Objective: Weight change: 4.8 oz (0.136 kg)  Intake/Output Summary (Last 24 hours) at 11/11/13 0919 Last data filed at 11/10/13 1915  Gross per 24 hour  Intake    220 ml  Output      0 ml  Net    220 ml   Filed Vitals:   11/10/13 0500 11/10/13 1500 11/10/13 2102 11/11/13 0540  BP:  133/65 128/62 142/65  Pulse:  81 79 80  Temp:   98.5 F (36.9 C) 97.5 F (36.4 C)  TempSrc:   Oral Oral  Resp:  $Remo'18 18 18  'zXktt$ Weight: 181 lb 8 oz (82.328 kg)   181 lb 12.8 oz (82.464 kg)  SpO2:  94% 95% 100%    General Appearance: Alert, somewhat sedated, confused, not agitated Lungs: Clear to auscultation bilaterally, respirations unlabored  Heart: Regular rate and rhythm, S1 and S2 normal, no murmur, rub or gallop  Abdomen: Soft, non-tender, bowel sounds active all four quadrants, no masses, no organomegaly  Extremities: Extremities normal, atraumatic, no cyanosis or edema  Neuro: Oriented to name only. Nonfocal exam.  Lab Results: Results for orders placed during the hospital encounter of 11/06/13 (from the past 48 hour(s))  GLUCOSE, CAPILLARY     Status: Abnormal   Collection Time    11/09/13  2:12 PM      Result Value Ref Range   Glucose-Capillary 145 (*) 70 - 99 mg/dL  BASIC METABOLIC PANEL     Status: Abnormal   Collection Time    11/09/13  3:51 PM      Result Value Ref Range   Sodium 139  137 - 147 mEq/L   Potassium 4.2  3.7 - 5.3 mEq/L   Chloride 98  96 - 112 mEq/L   CO2 25  19 - 32 mEq/L   Glucose, Bld 136 (*) 70 - 99 mg/dL   BUN 20  6 - 23 mg/dL   Creatinine, Ser 1.31 (*) 0.50 - 1.10 mg/dL   Calcium  9.4  8.4 - 10.5 mg/dL   GFR calc non Af Amer 36 (*) >90 mL/min   GFR calc Af Amer 42 (*) >90 mL/min   Comment: (NOTE)     The eGFR has been calculated using the CKD EPI equation.     This calculation has not been validated in all clinical situations.     eGFR's persistently <90 mL/min signify possible Chronic Kidney     Disease.  TROPONIN I     Status: None   Collection Time    11/09/13  3:51 PM      Result Value Ref Range   Troponin I <0.30  <0.30 ng/mL   Comment:            Due to the release kinetics of cTnI,     a negative result within the first hours     of the onset of symptoms does not rule out     myocardial infarction with certainty.     If myocardial infarction is still suspected,     repeat the test at appropriate intervals.  BASIC METABOLIC PANEL     Status: Abnormal   Collection Time    11/10/13  3:46 AM      Result Value Ref Range   Sodium 139  137 - 147 mEq/L   Potassium 4.2  3.7 - 5.3 mEq/L   Chloride 100  96 - 112 mEq/L   CO2 23  19 - 32 mEq/L   Glucose, Bld 105 (*) 70 - 99 mg/dL   BUN 21  6 - 23 mg/dL   Creatinine, Ser 1.39 (*) 0.50 - 1.10 mg/dL   Calcium 8.8  8.4 - 10.5 mg/dL   GFR calc non Af Amer 34 (*) >90 mL/min   GFR calc Af Amer 39 (*) >90 mL/min   Comment: (NOTE)     The eGFR has been calculated using the CKD EPI equation.     This calculation has not been validated in all clinical situations.     eGFR's persistently <90 mL/min signify possible Chronic Kidney     Disease.  GLUCOSE, CAPILLARY     Status: Abnormal   Collection Time    11/10/13  5:37 AM      Result Value Ref Range   Glucose-Capillary 104 (*) 70 - 99 mg/dL  CBC WITH DIFFERENTIAL     Status: Abnormal   Collection Time    11/11/13  5:50 AM      Result Value Ref Range   WBC 4.2  4.0 - 10.5 K/uL   RBC 4.74  3.87 - 5.11 MIL/uL   Hemoglobin 12.5  12.0 - 15.0 g/dL   HCT 39.1  36.0 - 46.0 %   MCV 82.5  78.0 - 100.0 fL   MCH 26.4  26.0 - 34.0 pg   MCHC 32.0  30.0 - 36.0 g/dL   RDW  14.6  11.5 - 15.5 %   Platelets 190  150 - 400 K/uL   Neutrophils Relative % 31 (*) 43 - 77 %   Neutro Abs 1.3 (*) 1.7 - 7.7 K/uL   Lymphocytes Relative 54 (*) 12 - 46 %   Lymphs Abs 2.3  0.7 - 4.0 K/uL   Monocytes Relative 12  3 - 12 %   Monocytes Absolute 0.5  0.1 - 1.0 K/uL   Eosinophils Relative 2  0 - 5 %   Eosinophils Absolute 0.1  0.0 - 0.7 K/uL   Basophils Relative 1  0 - 1 %   Basophils Absolute 0.0  0.0 - 0.1 K/uL  COMPREHENSIVE METABOLIC PANEL     Status: Abnormal   Collection Time    11/11/13  5:50 AM      Result Value Ref Range   Sodium 138  137 - 147 mEq/L   Potassium 4.1  3.7 - 5.3 mEq/L   Chloride 98  96 - 112 mEq/L   CO2 23  19 - 32 mEq/L   Glucose, Bld 87  70 - 99 mg/dL   BUN 35 (*) 6 - 23 mg/dL   Comment: DELTA CHECK NOTED   Creatinine, Ser 1.84 (*) 0.50 - 1.10 mg/dL   Calcium 9.4  8.4 - 10.5 mg/dL   Total Protein 6.7  6.0 - 8.3 g/dL   Albumin 3.2 (*) 3.5 - 5.2 g/dL   AST 23  0 - 37 U/L   ALT 12  0 - 35 U/L   Alkaline Phosphatase 87  39 - 117 U/L   Total Bilirubin 0.6  0.3 - 1.2 mg/dL   GFR calc non Af Amer 24 (*) >  90 mL/min   GFR calc Af Amer 28 (*) >90 mL/min   Comment: (NOTE)     The eGFR has been calculated using the CKD EPI equation.     This calculation has not been validated in all clinical situations.     eGFR's persistently <90 mL/min signify possible Chronic Kidney     Disease.  GLUCOSE, CAPILLARY     Status: None   Collection Time    11/11/13  6:27 AM      Result Value Ref Range   Glucose-Capillary 71  70 - 99 mg/dL  GLUCOSE, CAPILLARY     Status: None   Collection Time    11/11/13  7:44 AM      Result Value Ref Range   Glucose-Capillary 99  70 - 99 mg/dL    Studies/Results: Ct Head Wo Contrast  11/09/2013   CLINICAL DATA:  Code stroke.  Sudden change in neuro status.  EXAM: CT HEAD WITHOUT CONTRAST  TECHNIQUE: Contiguous axial images were obtained from the base of the skull through the vertex without intravenous contrast.  COMPARISON:   Brain MRI, 11/07/2013.  Head CT, 11/06/2013.  FINDINGS: Ventricles are normal in configuration. There is ventricular and sulcal enlargement reflecting moderate atrophy. No hydrocephalus.  Patchy white matter hypoattenuation is noted consistent with advanced chronic microvascular ischemic change. There is evidence of several superimposed old deep white matter lacunar infarcts.  There is no evidence of a recent cortical infarct.  There are no extra-axial masses or abnormal fluid collections.  There is no intracranial hemorrhage.  Visualized sinuses and mastoid air cells are clear.  IMPRESSION: 1. No acute intracranial abnormalities. 2. Atrophy and advanced chronic microvascular ischemic change, stable from the prior exams. These results were called by telephone at the time of interpretation on 11/09/2013 at 2:39 PM to Dr. Nicole Kindred , who verbally acknowledged these results.   Electronically Signed   By: Lajean Manes M.D.   On: 11/09/2013 14:42   Dg Chest Port 1 View  11/09/2013   CLINICAL DATA:  Possible aspiration  EXAM: PORTABLE CHEST - 1 VIEW  COMPARISON:  11/06/2013  FINDINGS: The heart size and mediastinal contours are within normal limits. Both lungs are clear. The visualized skeletal structures are unremarkable.  IMPRESSION: No active disease.   Electronically Signed   By: Inez Catalina M.D.   On: 11/09/2013 19:10   Medications: Scheduled Meds: . amLODipine  5 mg Oral Daily  . aspirin EC  81 mg Oral Daily  . cloNIDine  0.1 mg Oral QHS  . diltiazem  240 mg Oral Daily  . folic acid  1 mg Oral Daily  . glimepiride  2 mg Oral Q breakfast  . heparin  5,000 Units Subcutaneous 3 times per day  . hydrochlorothiazide  12.5 mg Oral Daily  . influenza vac split quadrivalent PF  0.5 mL Intramuscular Tomorrow-1000  . isosorbide mononitrate  30 mg Oral Daily  . losartan  100 mg Oral Daily  . multivitamin with minerals  1 tablet Oral Daily  . senna  1 tablet Oral BID  . thiamine  100 mg Oral Daily    Continuous Infusions: . sodium chloride    . dextrose 5 % and 0.45% NaCl Stopped (11/07/13 1700)   PRN Meds:.acetaminophen, acetaminophen, alum & mag hydroxide-simeth, bisacodyl, haloperidol, haloperidol lactate, hydrALAZINE, LORazepam, LORazepam, ondansetron (ZOFRAN) IV, ondansetron, risperiDONE  Assessment/Plan: Principal Problem:   Accelerated hypertension Active Problems:   DM (diabetes mellitus)   Impaired cognition   Altered mental  status  Principal Problem:  Accelerated hypertension - controlled, can reduce medicine slightly that might be sedating i.e. clonidine  Active Problems:  DM (diabetes mellitus) - controlled  Impaired cognition - sedated today from Ativan.  Will allow her to become more alert.  Change Risperdal to when necessary.  No seizure activity on EEG.. Appreciate help from neurology.  MRI was not repeated, nonfocal exam Altered mental status - , today and not paranoid.. Will change Risperdal to 0.25 milligrams twice daily when necessary Episode of unconsciousness - etiology very unclear.  Continue to monitor.  Appreciate consultation from neurology   LOS: 5 days   Henrine Screws, MD 11/11/2013, 9:19 AM

## 2013-11-11 NOTE — Progress Notes (Signed)
Subjective: Patient had no complaints. Mental status has improved significantly. She's had no recurrence loss of consciousness.  Objective: Current vital signs: BP 142/65  Pulse 80  Temp(Src) 97.5 F (36.4 C) (Oral)  Resp 18  Wt 82.464 kg (181 lb 12.8 oz)  SpO2 100%  Neurologic Exam: Patient was alert and in no acute distress. She was well oriented to time as well as place. She had some difficulty with her correct age, however. Speech was normal with no dysarthria. No facial weakness was noted. Patient moved extremities equally with no focal weakness.  Medications: I have reviewed the patient's current medications.  Assessment/Plan: Encephalopathic state with initial loss of consciousness followed by confusion and agitation. Etiology remains unclear. There is no clinical sign of acute stroke. EEG showed no indication of seizure activity. Patient's mental status is essentially back to baseline at this point.  No further neurological intervention is indicated at this point, including no need for repeat MRI of the brain. I will stop on her care at this point, but remain available for reevaluation if clinically indicated.  C.R. Roseanne RenoStewart, MD [facility] [phone]  11/11/2013  9:00 AM

## 2013-11-12 LAB — BASIC METABOLIC PANEL
BUN: 34 mg/dL — AB (ref 6–23)
CHLORIDE: 99 meq/L (ref 96–112)
CO2: 21 meq/L (ref 19–32)
Calcium: 8.8 mg/dL (ref 8.4–10.5)
Creatinine, Ser: 1.45 mg/dL — ABNORMAL HIGH (ref 0.50–1.10)
GFR calc Af Amer: 37 mL/min — ABNORMAL LOW (ref >90)
GFR calc non Af Amer: 32 mL/min — ABNORMAL LOW (ref >90)
GLUCOSE: 92 mg/dL (ref 70–99)
Potassium: 4.2 mEq/L (ref 3.7–5.3)
Sodium: 135 mEq/L — ABNORMAL LOW (ref 137–147)

## 2013-11-12 LAB — GLUCOSE, CAPILLARY: Glucose-Capillary: 92 mg/dL (ref 70–99)

## 2013-11-12 NOTE — Progress Notes (Signed)
Subjective: Patient still having some confusion.  Not safe for her to be at home alone.  Cannot reliably take her medications to prevent recurrent hepatic encephalopathy.  Still not sure her baseline will be mentally.  Recommend skilled nursing facility placement for further rehabilitation and hopeful improvement in mental confusion with consistent control of blood pressure.  Objective: Weight change:   Intake/Output Summary (Last 24 hours) at 11/12/13 0650 Last data filed at 11/11/13 2254  Gross per 24 hour  Intake    840 ml  Output    403 ml  Net    437 ml   Filed Vitals:   11/11/13 1127 11/11/13 1700 11/11/13 2224 11/12/13 0628  BP: 126/62 126/82 159/65 117/69  Pulse:   83 70  Temp:  97.4 F (36.3 C) 98.1 F (36.7 C)   TempSrc:  Oral Oral   Resp:   17 16  Weight:      SpO2:  99% 100% 98%    General Appearance: Alert, somewhat sedated, confused, not agitated  Lungs: Clear to auscultation bilaterally, respirations unlabored  Heart: Regular rate and rhythm, S1 and S2 normal, no murmur, rub or gallop  Abdomen: Soft, non-tender, bowel sounds active all four quadrants, no masses, no organomegaly  Extremities: Extremities normal, atraumatic, no cyanosis or edema  Neuro: Oriented to name only. Nonfocal exam.   Lab Results: Results for orders placed during the hospital encounter of 11/06/13 (from the past 48 hour(s))  CBC WITH DIFFERENTIAL     Status: Abnormal   Collection Time    11/11/13  5:50 AM      Result Value Ref Range   WBC 4.2  4.0 - 10.5 K/uL   RBC 4.74  3.87 - 5.11 MIL/uL   Hemoglobin 12.5  12.0 - 15.0 g/dL   HCT 39.1  36.0 - 46.0 %   MCV 82.5  78.0 - 100.0 fL   MCH 26.4  26.0 - 34.0 pg   MCHC 32.0  30.0 - 36.0 g/dL   RDW 14.6  11.5 - 15.5 %   Platelets 190  150 - 400 K/uL   Neutrophils Relative % 31 (*) 43 - 77 %   Neutro Abs 1.3 (*) 1.7 - 7.7 K/uL   Lymphocytes Relative 54 (*) 12 - 46 %   Lymphs Abs 2.3  0.7 - 4.0 K/uL   Monocytes Relative 12  3 - 12 %   Monocytes Absolute 0.5  0.1 - 1.0 K/uL   Eosinophils Relative 2  0 - 5 %   Eosinophils Absolute 0.1  0.0 - 0.7 K/uL   Basophils Relative 1  0 - 1 %   Basophils Absolute 0.0  0.0 - 0.1 K/uL  COMPREHENSIVE METABOLIC PANEL     Status: Abnormal   Collection Time    11/11/13  5:50 AM      Result Value Ref Range   Sodium 138  137 - 147 mEq/L   Potassium 4.1  3.7 - 5.3 mEq/L   Chloride 98  96 - 112 mEq/L   CO2 23  19 - 32 mEq/L   Glucose, Bld 87  70 - 99 mg/dL   BUN 35 (*) 6 - 23 mg/dL   Comment: DELTA CHECK NOTED   Creatinine, Ser 1.84 (*) 0.50 - 1.10 mg/dL   Calcium 9.4  8.4 - 10.5 mg/dL   Total Protein 6.7  6.0 - 8.3 g/dL   Albumin 3.2 (*) 3.5 - 5.2 g/dL   AST 23  0 - 37 U/L  ALT 12  0 - 35 U/L   Alkaline Phosphatase 87  39 - 117 U/L   Total Bilirubin 0.6  0.3 - 1.2 mg/dL   GFR calc non Af Amer 24 (*) >90 mL/min   GFR calc Af Amer 28 (*) >90 mL/min   Comment: (NOTE)     The eGFR has been calculated using the CKD EPI equation.     This calculation has not been validated in all clinical situations.     eGFR's persistently <90 mL/min signify possible Chronic Kidney     Disease.  GLUCOSE, CAPILLARY     Status: None   Collection Time    11/11/13  6:27 AM      Result Value Ref Range   Glucose-Capillary 71  70 - 99 mg/dL  GLUCOSE, CAPILLARY     Status: None   Collection Time    11/11/13  7:44 AM      Result Value Ref Range   Glucose-Capillary 99  70 - 99 mg/dL  BASIC METABOLIC PANEL     Status: Abnormal   Collection Time    11/12/13  3:50 AM      Result Value Ref Range   Sodium 135 (*) 137 - 147 mEq/L   Potassium 4.2  3.7 - 5.3 mEq/L   Chloride 99  96 - 112 mEq/L   CO2 21  19 - 32 mEq/L   Glucose, Bld 92  70 - 99 mg/dL   BUN 34 (*) 6 - 23 mg/dL   Creatinine, Ser 1.45 (*) 0.50 - 1.10 mg/dL   Calcium 8.8  8.4 - 10.5 mg/dL   GFR calc non Af Amer 32 (*) >90 mL/min   GFR calc Af Amer 37 (*) >90 mL/min   Comment: (NOTE)     The eGFR has been calculated using the CKD EPI  equation.     This calculation has not been validated in all clinical situations.     eGFR's persistently <90 mL/min signify possible Chronic Kidney     Disease.  GLUCOSE, CAPILLARY     Status: None   Collection Time    11/12/13  6:22 AM      Result Value Ref Range   Glucose-Capillary 92  70 - 99 mg/dL   Comment 1 Notify RN     Comment 2 Documented in Chart      Studies/Results: No results found. Medications: Scheduled Meds: . amLODipine  5 mg Oral Daily  . aspirin EC  81 mg Oral Daily  . cloNIDine  0.1 mg Oral QHS  . diltiazem  240 mg Oral Daily  . folic acid  1 mg Oral Daily  . glimepiride  2 mg Oral Q breakfast  . heparin  5,000 Units Subcutaneous 3 times per day  . hydrochlorothiazide  12.5 mg Oral Daily  . influenza vac split quadrivalent PF  0.5 mL Intramuscular Tomorrow-1000  . isosorbide mononitrate  30 mg Oral Daily  . losartan  100 mg Oral Daily  . multivitamin with minerals  1 tablet Oral Daily  . senna  1 tablet Oral BID  . thiamine  100 mg Oral Daily   Continuous Infusions: . sodium chloride    . dextrose 5 % and 0.45% NaCl Stopped (11/07/13 1700)   PRN Meds:.acetaminophen, acetaminophen, alum & mag hydroxide-simeth, bisacodyl, haloperidol, haloperidol lactate, LORazepam, LORazepam, ondansetron (ZOFRAN) IV, ondansetron  Assessment/Plan:  Principal Problem:  Accelerated hypertension - much better controlled Active Problems:  DM (diabetes mellitus) - controlled  Impaired cognition -  mildly sedated today from Ativan last p.m.. Will allow her to become more alert. No seizure activity on EEG.. Appreciate help from neurology. MRI was not repeated, nonfocal exam  Altered mental status - slowly improving and not paranoid this a.m.  Use Haldol when necessary  And we'll use a low dose of Ativan in the evenings for agitation and confusion.  Worked very well last night.   Episode of unconsciousness - etiology very unclear, no recurrences. Continue to monitor. Appreciate  consultation from neurology Disposition - skilled nursing facility for continued control of blood pressure and assurance of compliance with medications.  Hopefully she will continue to clear cognitively   LOS: 6 days   Henrine Screws, MD 11/12/2013, 6:50 AM

## 2013-11-13 LAB — GLUCOSE, CAPILLARY: GLUCOSE-CAPILLARY: 97 mg/dL (ref 70–99)

## 2013-11-13 MED ORDER — RISPERIDONE 1 MG PO TBDP
1.0000 mg | ORAL_TABLET | Freq: Every day | ORAL | Status: DC
  Administered 2013-11-13: 1 mg via ORAL
  Filled 2013-11-13 (×2): qty 1

## 2013-11-13 NOTE — Evaluation (Signed)
Physical Therapy Evaluation Patient Details Name: Samantha Bond MRN: [id number] DOB: 10-18-1926 Today's Date: 11/13/2013   History of Present Illness  Samantha Bond is a 80 y.o. female presented to the ED with confusion. The son noted that she had note been acting her normal self and brought her to the ED to be evaluated. She now seems to be back to normal. She apparently has some difficulty searching for words and phrases. She had been having headaches also. She is a known hypertensive and had run out of her medications but not sure for how long she has not been taking her medications.  Clinical Impression  Pt presents with generalized weakness as well as cognitive deficits which are compromising safety. Family not available to discuss help at home so at this point, recommending SNF for 24 hr supervision. PT will follow acutely to address problems listed below.    Follow Up Recommendations SNF;Supervision/Assistance - 24 hour    Equipment Recommendations  Rolling walker with 5" wheels    Recommendations for Other Services OT consult     Precautions / Restrictions Precautions Precautions: Fall Precaution Comments: pt denies falls at home but poor historian so question this Restrictions Weight Bearing Restrictions: No      Mobility  Bed Mobility               General bed mobility comments: pt up in chair  Transfers Overall transfer level: Needs assistance Equipment used: Rolling walker (2 wheeled) Transfers: Sit to/from Stand Sit to Stand: Min assist         General transfer comment: pt denied using RW at home but seemed very familiar with it. vc's for hand placement. Min A to maintain balance once standing  Ambulation/Gait Ambulation/Gait assistance: Min assist Ambulation Distance (Feet): 100 Feet Assistive device: Rolling walker (2 wheeled) Gait Pattern/deviations: Step-through pattern;Decreased stride length;Trunk flexed;Drifts right/left Gait  velocity: decreased   General Gait Details: drifts left and needed cues to attend to obstacles and avoid the, frequent cues to stay within RW and maintain upright posture.  Stairs            Wheelchair Mobility    Modified Rankin (Stroke Patients Only)       Balance Overall balance assessment: Needs assistance Sitting-balance support: No upper extremity supported;Feet supported Sitting balance-Leahy Scale: Good     Standing balance support: Bilateral upper extremity supported;During functional activity Standing balance-Leahy Scale: Poor Standing balance comment: relied on UE support today                             Pertinent Vitals/Pain VSS    Home Living Family/patient expects to be discharged to:: Private residence Living Arrangements: Children Available Help at Discharge: Family             Additional Comments: pt with minimal verbalization throughout session and somewhat confrontational. Could not or would not give full history or PLOF and home environment. She did report that she lives with her son but said sometimes they don't see eye to eye/ She reports that she did not use anything to walk and could really use a tub seat for the shower.    Prior Function Level of Independence: Independent (per pt)               Hand Dominance        Extremity/Trunk Assessment   Upper Extremity Assessment: Defer to OT evaluation;Overall [facility] for tasks assessed  Lower Extremity Assessment: Difficult to assess due to impaired cognition;Generalized weakness      Cervical / Trunk Assessment: Kyphotic  Communication   Communication: Other (comment) (decreased verbalization)  Cognition Arousal/Alertness: Awake/alert Behavior During Therapy: Flat affect Overall Cognitive Status: No family/caregiver present to determine baseline cognitive functioning Area of Impairment: Safety/judgement;Problem solving     Memory: Decreased short-term  memory   Safety/Judgement: Decreased awareness of safety   Problem Solving: Slow processing;Difficulty sequencing;Requires verbal cues General Comments: pt having difficulty following safety commands with RW. At times seems cognitively present and then will momentarily seem lost and then come back and be aggravated with therapist for asking if she is OK    General Comments      Exercises        Assessment/Plan    PT Assessment Patient needs continued PT services  PT Diagnosis Difficulty walking;Abnormality of gait;Generalized weakness   PT Problem List Decreased strength;Decreased activity tolerance;Decreased balance;Decreased mobility;Decreased coordination;Decreased cognition;Decreased knowledge of use of DME;Decreased safety awareness;Decreased knowledge of precautions  PT Treatment Interventions DME instruction;Gait training;Functional mobility training;Therapeutic activities;Therapeutic exercise;Balance training;Patient/family education;Cognitive remediation   PT Goals (Current goals can be found in the Care Plan section) Acute Rehab PT Goals Patient Stated Goal: none stated PT Goal Formulation: With patient Time For Goal Achievement: 11/27/13 Potential to Achieve Goals: Good    Frequency Min 3X/week   Barriers to discharge Decreased caregiver support unsure of family support situation but pt reports that there are times that she is alone. At this point, recommend 24 hr supervision due to decreased safety    Co-evaluation               End of Session Equipment Utilized During Treatment: Gait belt Activity Tolerance: Patient tolerated treatment well Patient left: with call bell/phone within reach;with nursing/sitter in room;Other (comment) (pt in bathroom, sitter to walk with her back to chair) Nurse Communication: Mobility status         Time: 1421-1435 PT Time Calculation (min): 14 min   Charges:   PT Evaluation $Initial PT Evaluation Tier I: 1  Procedure PT Treatments $Gait Training: 8-22 mins   PT G Codes:        Lyanne CoVictoria Cleotha Tsang, PT  Acute Rehab Services  [phone]   Lyanne CoManess, Nick Stults 11/13/2013, 3:55 PM

## 2013-11-13 NOTE — Progress Notes (Signed)
Patient continuously getting OOB to use the bathroom and getting agitated when told to ask for assistance.  PRN Ativan administered as ordered. Patient to bed with bed alarm on and call light within reach.  Patient currently resting comfortably. Will continue to monitor. Troy SineWalker, Vanessa Kampf M

## 2013-11-13 NOTE — Progress Notes (Signed)
At change of shift last night, patient was agitated, and patient had walked out in the hallway and almost to elevators according to what fellow staff members had told nurse. Sitter was placed in the room for the first four hours of the shift for patient wanted to leave and catch a cab. Patient was very upset but sitter was able to calm patient down but patient continued to get out of bed. Patient is very unsteady on her feet. Haldol was given per PRN order and was effective. Patient on two other occassions tried to get out of bed with notification of bed alarm ringing off to alert staff. Patient stated she wanted to get up to restroom. Currently patient is resting with no complaints of pain or discomfort. Will continue to monitor patient to end of shift.

## 2013-11-13 NOTE — Progress Notes (Signed)
Order received from Dr Isac CaddyHausin to d/c sitter.  As pt will be d/c to SNF tomorrow.   Pt resting quietly and follow redirection as needed.  Will continue to monitor.  Amanda PeaNellie Elfrieda Espino, Charity fundraiserN.

## 2013-11-13 NOTE — Progress Notes (Addendum)
Subjective: Patient became confused and agitated again last night.  Unsteady on her feet when she was walking out in the hallway.  Haldol was given when necessary.  Will treat with Risperdal daily at 5 PM.  Certainly has an element of dementia most likely on top of hypertensive encephalopathy which I think is slowly improving.  Will discontinue amlodipine and clonidine and see how blood pressure does.  On 2 calcium channel blockers currently and the clonidine might be contributing to confusion with sedation.  Objective: Weight change:   Intake/Output Summary (Last 24 hours) at 11/13/13 0710 Last data filed at 11/12/13 1300  Gross per 24 hour  Intake    600 ml  Output    100 ml  Net    500 ml   Filed Vitals:   11/12/13 1522 11/12/13 1847 11/12/13 2050 11/13/13 0656  BP: 132/67 149/75 145/61 126/69  Pulse: 77 83 77 57  Temp: 97.5 F (36.4 C)  97.8 F (36.6 C)   TempSrc: Oral Oral Oral   Resp: _0 Weight:    185 lb 13.6 oz (84.3 kg)  SpO2: 99% 99% 100% 98%      Lab Results: Results for orders placed during the hospital encounter of 11/06/13 (from the past 48 hour(s))  GLUCOSE, CAPILLARY     Status: None   Collection Time    11/11/13  7:44 AM      Result Value Ref Range   Glucose-Capillary 99  70 - 99 mg/dL  BASIC METABOLIC PANEL     Status: Abnormal   Collection Time    11/12/13  3:50 AM      Result Value Ref Range   Sodium 135 (*) 137 - 147 mEq/L   Potassium 4.2  3.7 - 5.3 mEq/L   Chloride 99  96 - 112 mEq/L   CO2 21  19 - 32 mEq/L   Glucose, Bld 92  70 - 99 mg/dL   BUN 34 (*) 6 - 23 mg/dL   Creatinine, Ser 1.45 (*) 0.50 - 1.10 mg/dL   Calcium 8.8  8.4 - 10.5 mg/dL   GFR calc non Af Amer 32 (*) >90 mL/min   GFR calc Af Amer 37 (*) >90 mL/min   Comment: (NOTE)     The eGFR has been calculated using the CKD EPI equation.     This calculation has not been validated in all clinical situations.     eGFR's persistently <90 mL/min signify possible Chronic Kidney     Disease.  GLUCOSE, CAPILLARY     Status: None   Collection Time    11/12/13  6:22 AM      Result Value Ref Range   Glucose-Capillary 92  70 - 99 mg/dL   Comment 1 Notify RN     Comment 2 Documented in Chart    GLUCOSE, CAPILLARY     Status: None   Collection Time    11/13/13  5:59 AM      Result Value Ref Range   Glucose-Capillary 97  70 - 99 mg/dL    Studies/Results: No results found. Medications: Scheduled Meds: . amLODipine  5 mg Oral Daily  . aspirin EC  81 mg Oral Daily  . cloNIDine  0.1 mg Oral QHS  . diltiazem  240 mg Oral Daily  . folic acid  1 mg Oral Daily  . glimepiride  2 mg Oral Q breakfast  . heparin  5,000 Units Subcutaneous 3 times per day  . hydrochlorothiazide  12.5 mg Oral Daily  . influenza vac split quadrivalent PF  0.5 mL Intramuscular Tomorrow-1000  . isosorbide mononitrate  30 mg Oral Daily  . losartan  100 mg Oral Daily  . multivitamin with minerals  1 tablet Oral Daily  . senna  1 tablet Oral BID  . thiamine  100 mg Oral Daily   Continuous Infusions: . sodium chloride    . dextrose 5 % and 0.45% NaCl Stopped (11/07/13 1700)   PRN Meds:.acetaminophen, acetaminophen, alum & mag hydroxide-simeth, bisacodyl, haloperidol, haloperidol lactate, LORazepam, LORazepam, ondansetron (ZOFRAN) IV, ondansetron  Assessment/Plan: Principal Problem:   Accelerated hypertension Active Problems:   DM (diabetes mellitus)   Impaired cognition   Altered mental status/agitation   Renal insufficiency, stage III kidney disease  Principal Problem:  Accelerated hypertension - resolved.  Reduce blood pressure medications slightly Active Problems:  DM (diabetes mellitus) - controlled  Impaired cognition - responded to Haldol last night.  Still coming agitated and more confused in the evenings.  Classically sundowning which may improve with prolonged blood pressure control.. Will allow her to become more alert - discontinue clonidine. No seizure activity on EEG..  Appreciate help from neurology. MRI was not repeated, nonfocal exam.  Altered mental status - slowly improving and not paranoid this a.m. Will use standing dose of Risperdal and when necessary Haldol.   And we'll use a low dose of Ativan in the evenings for agitation and confusion if necessary.  Haldol worked well last night.  Episode of unconsciousness - etiology very unclear, no recurrences. Continue to monitor. Appreciate consultation from neurology  Disposition - skilled nursing facility for continued control of blood pressure and assurance of compliance with medications. Hopefully she will continue to clear cognitively    LOS: 7 days   Henrine Screws, MD 11/13/2013, 7:10 AM

## 2013-11-14 LAB — COMPREHENSIVE METABOLIC PANEL
ALT: 14 U/L (ref 0–35)
AST: 28 U/L (ref 0–37)
Albumin: 3.3 g/dL — ABNORMAL LOW (ref 3.5–5.2)
Alkaline Phosphatase: 94 U/L (ref 39–117)
BUN: 29 mg/dL — ABNORMAL HIGH (ref 6–23)
CALCIUM: 9.7 mg/dL (ref 8.4–10.5)
CO2: 23 mEq/L (ref 19–32)
Chloride: 99 mEq/L (ref 96–112)
Creatinine, Ser: 1.27 mg/dL — ABNORMAL HIGH (ref 0.50–1.10)
GFR calc Af Amer: 43 mL/min — ABNORMAL LOW (ref >90)
GFR calc non Af Amer: 37 mL/min — ABNORMAL LOW (ref >90)
Glucose, Bld: 138 mg/dL — ABNORMAL HIGH (ref 70–99)
Potassium: 4.6 mEq/L (ref 3.7–5.3)
Sodium: 138 mEq/L (ref 137–147)
TOTAL PROTEIN: 7 g/dL (ref 6.0–8.3)
Total Bilirubin: 0.4 mg/dL (ref 0.3–1.2)

## 2013-11-14 LAB — BASIC METABOLIC PANEL
BUN: 31 mg/dL — ABNORMAL HIGH (ref 6–23)
CHLORIDE: 99 meq/L (ref 96–112)
CO2: 24 mEq/L (ref 19–32)
CREATININE: 1.39 mg/dL — AB (ref 0.50–1.10)
Calcium: 9.7 mg/dL (ref 8.4–10.5)
GFR calc non Af Amer: 34 mL/min — ABNORMAL LOW (ref >90)
GFR, EST AFRICAN AMERICAN: 39 mL/min — AB (ref >90)
Glucose, Bld: 179 mg/dL — ABNORMAL HIGH (ref 70–99)
Potassium: 4.2 mEq/L (ref 3.7–5.3)
SODIUM: 140 meq/L (ref 137–147)

## 2013-11-14 LAB — CBC
HCT: 40.2 % (ref 36.0–46.0)
Hemoglobin: 13 g/dL (ref 12.0–15.0)
MCH: 26.7 pg (ref 26.0–34.0)
MCHC: 32.3 g/dL (ref 30.0–36.0)
MCV: 82.5 fL (ref 78.0–100.0)
PLATELETS: 171 10*3/uL (ref 150–400)
RBC: 4.87 MIL/uL (ref 3.87–5.11)
RDW: 14.4 % (ref 11.5–15.5)
WBC: 4.3 10*3/uL (ref 4.0–10.5)

## 2013-11-14 LAB — GLUCOSE, CAPILLARY: GLUCOSE-CAPILLARY: 158 mg/dL — AB (ref 70–99)

## 2013-11-14 MED ORDER — RISPERIDONE 2 MG PO TBDP
2.0000 mg | ORAL_TABLET | Freq: Every day | ORAL | Status: DC
  Administered 2013-11-14: 2 mg via ORAL
  Filled 2013-11-14 (×2): qty 1

## 2013-11-14 MED ORDER — SODIUM CHLORIDE 0.9 % IV SOLN
INTRAVENOUS | Status: DC
Start: 2013-11-14 — End: 2013-11-15

## 2013-11-14 MED ORDER — PANTOPRAZOLE SODIUM 40 MG IV SOLR: 40.0000 mg | Freq: Two times a day (BID) | INTRAVENOUS | Status: DC

## 2013-11-14 MED ORDER — LOSARTAN POTASSIUM 50 MG PO TABS
50.0000 mg | ORAL_TABLET | Freq: Every day | ORAL | Status: DC
  Administered 2013-11-16: 50 mg via ORAL
  Filled 2013-11-14 (×3): qty 1

## 2013-11-14 MED ORDER — SODIUM CHLORIDE 0.9 % IV SOLN
INTRAVENOUS | Status: DC
Start: 2013-11-14 — End: 2013-11-16
  Administered 2013-11-14: 1000 mL via INTRAVENOUS
  Administered 2013-11-14: 13:00:00 via INTRAVENOUS

## 2013-11-14 MED ORDER — FAMOTIDINE IN NACL 20-0.9 MG/50ML-% IV SOLN
20.0000 mg | INTRAVENOUS | Status: DC
  Administered 2013-11-14 – 2013-11-16 (×3): 20 mg via INTRAVENOUS
  Filled 2013-11-14 (×4): qty 50

## 2013-11-14 NOTE — Progress Notes (Signed)
Informed Dr. Kevan NyGates that pt still remain sleepy but arousable and not able to take her po med.  Instructed to order & start NS @ 50 ml/hr.  Will continue to monitor.  Amanda PeaNellie Bellamie Turney, Charity fundraiserN.

## 2013-11-14 NOTE — Progress Notes (Signed)
Notified pt's son Carolin SicksJeff Terry about pt vomiting coffee ground emesis this am. Pt stable and sleepy this am.  Will continue to monitor.  Amanda PeaNellie Cody Albus, Charity fundraiserN.

## 2013-11-14 NOTE — Consult Note (Signed)
[facility] Consultation Note  Referring Provider: Dr. Marden Noble Primary Care Physician:  Pearla Dubonnet, MD  Reason for Consultation:  hematemesis  HPI: Samantha Bond is a 80 y.o. female presenting with hypertensive urgency and altered mental status.  On the road to discharge, but was found to be in pool of coffee grounds by nursing staff on their rounds this morning.  Patient is somnolent and unable to provide much history, but son Trey Paula is at bedside to assist.  No clear prior history of bleeding.  No clear prior history of endoscopy or colonoscopy.  Is on NSAIDs, but no obvious other blood thinners.  She has had no further hematemesis since this morning.  No melena or hematochezia.  Hgb normal.  Vitals ok.   Past Medical History  Diagnosis Date  . Hypertension   . Diabetes mellitus   . DJD (degenerative joint disease)   . Peripheral neuropathy   . Mild aortic sclerosis   . Spinal stenosis   . Diverticulosis   . DJD (degenerative joint disease)     knees, back & cervical    Past Surgical History  Procedure Laterality Date  . Cholecystectomy      Prior to Admission medications   Medication Sig Start Date End Date Taking? Authorizing Provider  diltiazem (CARDIZEM CD) 240 MG 24 hr capsule Take 240 mg by mouth daily.    Historical Provider, MD  glimepiride (AMARYL) 2 MG tablet Take 2 mg by mouth daily.    Historical Provider, MD  losartan (COZAAR) 50 MG tablet Take 50 mg by mouth daily.    Historical Provider, MD  naproxen sodium (ANAPROX) 220 MG tablet Take 220-440 mg by mouth 2 (two) times daily as needed (for pain).    Historical Provider, MD    Current Facility-Administered Medications  Medication Dose Route Frequency Provider Last Rate Last Dose  . 0.9 %  sodium chloride infusion   Intravenous Continuous Hollice Espy, MD      . 0.9 %  sodium chloride infusion   Intravenous Continuous Marden Noble, MD 50 mL/hr at 11/14/13 1244    . acetaminophen  (TYLENOL) tablet 650 mg  650 mg Oral Q6H PRN Yevonne Pax, MD   650 mg at 11/12/13 2145   Or  . acetaminophen (TYLENOL) suppository 650 mg  650 mg Rectal Q6H PRN Yevonne Pax, MD      . alum & mag hydroxide-simeth (MAALOX/MYLANTA) 200-200-20 MG/5ML suspension 30 mL  30 mL Oral Q6H PRN Yevonne Pax, MD      . aspirin EC tablet 81 mg  81 mg Oral Daily Yevonne Pax, MD   81 mg at 11/13/13 1059  . bisacodyl (DULCOLAX) suppository 10 mg  10 mg Rectal Daily PRN Yevonne Pax, MD      . dextrose 5 %-0.45 % sodium chloride infusion   Intravenous Continuous Yevonne Pax, MD      . diltiazem (CARDIZEM CD) 24 hr capsule 240 mg  240 mg Oral Daily Yevonne Pax, MD   240 mg at 11/13/13 1057  . famotidine (PEPCID) IVPB 20 mg  20 mg Intravenous Q24H Marden Noble, MD   20 mg at 11/14/13 1133  . folic acid (FOLVITE) tablet 1 mg  1 mg Oral Daily Marden Noble, MD   1 mg at 11/13/13 1059  . glimepiride (AMARYL) tablet 2 mg  2 mg Oral Q breakfast Yevonne Pax, MD   2 mg at 11/14/13 9604  . haloperidol (  HALDOL) tablet 1 mg  1 mg Oral Q6H PRN Marden Nobleobert Gates, MD   1 mg at 11/12/13 2144   Or  . haloperidol lactate (HALDOL) injection 1 mg  1 mg Intramuscular Q6H PRN Marden Nobleobert Gates, MD   1 mg at 11/09/13 1804  . heparin injection 5,000 Units  5,000 Units Subcutaneous 3 times per day Yevonne PaxSaadat A Khan, MD   5,000 Units at 11/14/13 0536  . hydrochlorothiazide (MICROZIDE) capsule 12.5 mg  12.5 mg Oral Daily Marden Nobleobert Gates, MD   12.5 mg at 11/13/13 1059  . influenza vac split quadrivalent PF (FLUARIX) injection 0.5 mL  0.5 mL Intramuscular Tomorrow-1000 Marden Nobleobert Gates, MD      . LORazepam (ATIVAN) tablet 1 mg  1 mg Oral Q6H PRN Marden Nobleobert Gates, MD   1 mg at 11/13/13 2144   Or  . LORazepam (ATIVAN) injection 1 mg  1 mg Intramuscular Q6H PRN Marden Nobleobert Gates, MD      . losartan (COZAAR) tablet 50 mg  50 mg Oral Daily Marden Nobleobert Gates, MD      . multivitamin with minerals tablet 1 tablet  1 tablet Oral Daily Yevonne PaxSaadat A Khan, MD   1 tablet at  11/13/13 1056  . ondansetron (ZOFRAN) tablet 4 mg  4 mg Oral Q6H PRN Yevonne PaxSaadat A Khan, MD       Or  . ondansetron [facility](ZOFRAN) injection 4 mg  4 mg Intravenous Q6H PRN Yevonne PaxSaadat A Khan, MD      . risperiDONE (RISPERDAL M-TABS) disintegrating tablet 2 mg  2 mg Oral Daily Marden Nobleobert Gates, MD      . senna [facility](SENOKOT) tablet 8.6 mg  1 tablet Oral BID Yevonne PaxSaadat A Khan, MD   8.6 mg at 11/13/13 2144  . thiamine (VITAMIN B-1) tablet 100 mg  100 mg Oral Daily Marden Nobleobert Gates, MD   100 mg at 11/13/13 1100    Allergies as of 11/06/2013 - Review Complete 11/06/2013  Allergen Reaction Noted  . Benicar [olmesartan] Other (See Comments) 01/14/2012  . Lyrica [pregabalin] Other (See Comments) 01/14/2012  . Metformin and related Diarrhea and Other (See Comments) 01/14/2012  . Toprol xl [metoprolol] Other (See Comments) 01/14/2012  . Vicodin [hydrocodone-acetaminophen] Nausea Only 01/14/2012    No family history on file.  History   Social History  . Marital Status: Widowed    Spouse Name: N/A    Number of Children: N/A  . Years of Education: N/A   Occupational History  . Not on file.   Social History Main Topics  . Smoking status: Never Smoker   . Smokeless tobacco: Not on file  . Alcohol Use: No  . Drug Use: No  . Sexual Activity:    Other Topics Concern  . Not on file   Social History Narrative   Lives alone.      Review of Systems: Unable to obtain due to patient's somnolent state  Physical Exam: Vital signs in last 24 hours: Temp:  [97.8 F (36.6 C)-98.1 F (36.7 C)] 98 F (36.7 C) (04/08 0439) Pulse Rate:  [70-77] 74 (04/08 1148) Resp:  [18] 18 (04/08 0439) BP: (87-139)/(53-71) 130/67 mmHg (04/08 1148) SpO2:  [95 %-96 %] 95 % (04/08 0439) Weight:  [84 kg (185 lb 3 oz)] 84 kg (185 lb 3 oz) (04/08 0439) Last BM Date: 11/13/13 General:   Somnolent but arousable, NAD Head:  Normocephalic and atraumatic. Eyes:  Sclera clear, no icterus.   Conjunctiva pink. Ears:  Normal auditory acuity. Nose:   No deformity, discharge,  or lesions. Mouth:  No deformity or lesions.  Oropharynx pink & moist. Neck:  Supple; no masses or thyromegaly. Lungs:  Clear throughout to auscultation.   No wheezes, crackles, or rhonchi. No acute distress. Heart:  Regular rate and rhythm; no murmurs, clicks, rubs,  or gallops. Abdomen:  Soft, nontender and nondistended. No masses, hepatosplenomegaly or hernias noted. Normal bowel sounds, without guarding, and without rebound.     Msk:  Symmetrical without gross deformities. Normal posture. Pulses:  Normal pulses noted. Extremities:  Without clubbing or edema. Neurologic:  Somnolent but arousable; chronic dementia versus cognitive decline Skin:  Intact without significant lesions or rashes. Cervical Nodes:  No significant cervical adenopathy. Psych:  Alert and cooperative. Normal mood and affect.   Lab Results:  Recent Labs  11/14/13 1040  WBC 4.3  HGB 13.0  HCT 40.2  PLT 171   BMET  Recent Labs  11/12/13 0350 11/14/13 0525 11/14/13 1040  NA 135* 140 138  K 4.2 4.2 4.6  CL 99 99 99  CO2 21 24 23   GLUCOSE 92 179* 138*  BUN 34* 31* 29*  CREATININE 1.45* 1.39* 1.27*  CALCIUM 8.8 9.7 9.7   LFT  Recent Labs  11/14/13 1040  PROT 7.0  ALBUMIN 3.3*  AST 28  ALT 14  ALKPHOS 94  BILITOT 0.4   PT/INR No results found for this basename: LABPROT, INR,  in the last 72 hours  Studies/Results: No results found.  Impression:  1.  Coffee ground emesis by report.  Aftermath seen by nursing, patient can't provide information and she has had no further evidence of bleeding and is not anemic.  Plan:  1.  I have discussed at length with patient's son, [name] ([phone]), and we have opted, given the clinical uncertainty, to go ahead and do endoscopy.  Will plan to do tomorrow. 2.  PPI for now. 3.  Clear liquids now, NPO after midnight. 4.  Risks (bleeding, infection, bowel perforation that could require surgery, sedation-related changes in  cardiopulmonary systems), benefits (identification and possible treatment of source of symptoms, exclusion of certain causes of symptoms), and alternatives (watchful waiting, radiographic imaging studies, empiric medical treatment) of upper endoscopy (EGD) were explained to patient/family in detail and patient wishes to proceed.   LOS: 8 days   Willis Modena  11/14/2013, 1:34 PM

## 2013-11-14 NOTE — Progress Notes (Signed)
Pt's son arrived at change of shift to sit with pt.  Continues to refused  Assistance and meds.  Will continue to monitor.  Amanda PeaNellie Sary Bogie, Charity fundraiserN.

## 2013-11-14 NOTE — Progress Notes (Signed)
Pt remain agitated and combative.  Attempting to hit staff members while redirecting her.  Unsteady on feet and up on side of her bed refused assistance to get back in.  Pt weak with unsteady gait. Son has been called and stated he is on his way.   Pt pulled her iv out and unable and refused to have nurse restart a new one  & unable to give haldol po or IM.  Will continue to monitor.  Amanda PeaNellie Kura Bethards, Charity fundraiserN.

## 2013-11-14 NOTE — Progress Notes (Signed)
Follow up with call from pt's daughter Ludwig LeanLisa Cummings whom I had call this am to inform her pt had coffee ground emesis but unvailable and her brother Carolin SicksJeff Terry have been informed .  Amanda PeaNellie Lynnex Fulp, Charity fundraiserN.

## 2013-11-14 NOTE — Progress Notes (Signed)
Called by primary RN for patient noted to have large amount of  coffee ground emesis on pillow and  in mouth.  VSS, patient is lethargic, arouseable to voice and has mumbled speech.  Skin warm and dry.  RN paged MD to update.  Rn to call if assistance needed

## 2013-11-14 NOTE — Progress Notes (Signed)
Noted pt vomited coffee ground emesis on her pillow, pt laying on her Rt. Side in bed.  Pt mumble when asked questions.  Paged Dr. Kevan NyGates office instructed by answering service will be opened at 8am.  Will call again.  Continue to monitor.  Amanda PeaNellie Taiwana Willison, Charity fundraiserN.

## 2013-11-14 NOTE — Progress Notes (Signed)
Utilization Review Completed Foster Frericks J. Trestan Vahle, RN, BSN, NCM [phone]  

## 2013-11-14 NOTE — Progress Notes (Signed)
Pt more awake and getting OOB w/out assistance.  Refused to be redirected x3  Stated " I want to go home, let me out of here."    Called pt's son Carolin SicksJeff Terry and informed him of situation.  Instructed that he will come over.  Will continue to monitor.   Amanda PeaNellie Dora Clauss, Charity fundraiserN.

## 2013-11-14 NOTE — Progress Notes (Signed)
Pt continue to rest quietly in bed.  No more vomiting.  Arousable to name and touch.  Dr. Kevan NyGates informed and instructed to order protonix 40mg  iv q 12 hours, CBC & CMET now and CBC at 1700. And also MD will call GI to see pt.  Will continue to monitor.  Amanda PeaNellie Xiao Graul, Charity fundraiserN.

## 2013-11-14 NOTE — Progress Notes (Signed)
Subjective: Patient still with agitation in the evenings.  Increase standing order of Risperdal and continue Ativan and Haldol as needed.  Suspect she likely has an element of dementia as well.  Not just hypertensive delirium  Objective: Weight change: -10.6 oz (-0.3 kg)  Intake/Output Summary (Last 24 hours) at 11/14/13 0759 Last data filed at 11/14/13 1660  Gross per 24 hour  Intake   1180 ml  Output   1001 ml  Net    179 ml   Filed Vitals:   11/13/13 1056 11/13/13 1500 11/13/13 2100 11/14/13 0439  BP: 118/58 116/53 87/65 102/60  Pulse:  77 76 74  Temp:  97.8 F (36.6 C) 98.1 F (36.7 C) 98 F (36.7 C)  TempSrc:  Oral Oral Oral  Resp:  _0 Weight:    185 lb 3 oz (84 kg)  SpO2:  96% 96% 95%    General Appearance: Alert, somewhat sedated, confused, not agitated  Lungs: Clear to auscultation bilaterally, respirations unlabored  Heart: Regular rate and rhythm, S1 and S2 normal, no murmur, rub or gallop  Abdomen: Soft, non-tender, bowel sounds active all four quadrants, no masses, no organomegaly  Extremities: Extremities normal, atraumatic, no cyanosis or edema  Neuro: Oriented to name only. Nonfocal exam.   Lab Results: Results for orders placed during the hospital encounter of 11/06/13 (from the past 48 hour(s))  GLUCOSE, CAPILLARY     Status: None   Collection Time    11/13/13  5:59 AM      Result Value Ref Range   Glucose-Capillary 97  70 - 99 mg/dL  BASIC METABOLIC PANEL     Status: Abnormal   Collection Time    11/14/13  5:25 AM      Result Value Ref Range   Sodium 140  137 - 147 mEq/L   Potassium 4.2  3.7 - 5.3 mEq/L   Chloride 99  96 - 112 mEq/L   CO2 24  19 - 32 mEq/L   Glucose, Bld 179 (*) 70 - 99 mg/dL   BUN 31 (*) 6 - 23 mg/dL   Creatinine, Ser 1.39 (*) 0.50 - 1.10 mg/dL   Calcium 9.7  8.4 - 10.5 mg/dL   GFR calc non Af Amer 34 (*) >90 mL/min   GFR calc Af Amer 39 (*) >90 mL/min   Comment: (NOTE)     The eGFR has been calculated using the CKD  EPI equation.     This calculation has not been validated in all clinical situations.     eGFR's persistently <90 mL/min signify possible Chronic Kidney     Disease.  GLUCOSE, CAPILLARY     Status: Abnormal   Collection Time    11/14/13  6:05 AM      Result Value Ref Range   Glucose-Capillary 158 (*) 70 - 99 mg/dL   Comment 1 Notify RN      Studies/Results: No results found. Medications: Scheduled Meds: . aspirin EC  81 mg Oral Daily  . diltiazem  240 mg Oral Daily  . folic acid  1 mg Oral Daily  . glimepiride  2 mg Oral Q breakfast  . heparin  5,000 Units Subcutaneous 3 times per day  . hydrochlorothiazide  12.5 mg Oral Daily  . influenza vac split quadrivalent PF  0.5 mL Intramuscular Tomorrow-1000  . isosorbide mononitrate  30 mg Oral Daily  . losartan  100 mg Oral Daily  . multivitamin with minerals  1 tablet Oral Daily  .  risperiDONE  2 mg Oral Daily  . senna  1 tablet Oral BID  . thiamine  100 mg Oral Daily   Continuous Infusions: . sodium chloride    . dextrose 5 % and 0.45% NaCl Stopped (11/07/13 1700)   PRN Meds:.acetaminophen, acetaminophen, alum & mag hydroxide-simeth, bisacodyl, haloperidol, haloperidol lactate, LORazepam, LORazepam, ondansetron (ZOFRAN) IV, ondansetron  Assessment/Plan: Principal Problem:   Accelerated hypertension Active Problems:   DM (diabetes mellitus)   Impaired cognition   Altered mental status  Principal Problem:  Accelerated hypertension - resolved.  At this point, actually too well controlled.  We'll reduce blood pressure medicines more  Active Problems:  DM (diabetes mellitus) - controlled  Impaired cognition - responded to Ativan last night but still agitated and will increase Risperdal to 2 milligrams. Still coming agitated and more confused in the evenings. Classically sundowning which may improve with prolonged blood pressure control.. Will allow her to become more alert - discontinue clonidine. No seizure activity on EEG..  Appreciated help from neurology. MRI was not repeated, nonfocal exam.  Altered mental status - very slow improvement and trying to titrate medications for agitation and delirium. Will increase standing dose of Risperdal and when necessary Haldol. And we'll use a low dose of Ativan in the evenings for agitation and confusion if necessary. Haldol worked well last night.  Episode of unconsciousness - etiology very unclear, no recurrences. Continue to monitor. Appreciate consultation from neurology  Disposition - skilled nursing facility for continued control of blood pressure and assurance of compliance with medications. Hopefully she will continue to clear cognitively     LOS: 8 days   Henrine Screws, MD 11/14/2013, 7:59 AM

## 2013-11-15 ENCOUNTER — Encounter (HOSPITAL_COMMUNITY)
Admission: EM | Disposition: A | Discharge status: Skilled Nursing Facility | Payer: Self-pay | Source: Home / Self Care | Attending: Internal Medicine

## 2013-11-15 ENCOUNTER — Encounter (HOSPITAL_COMMUNITY): Payer: Self-pay | Admitting: Gastroenterology

## 2013-11-15 HISTORY — PX: ESOPHAGOGASTRODUODENOSCOPY: SHX5428

## 2013-11-15 LAB — GLUCOSE, CAPILLARY
GLUCOSE-CAPILLARY: 96 mg/dL (ref 70–99)
Glucose-Capillary: 92 mg/dL (ref 70–99)

## 2013-11-15 SURGERY — EGD (ESOPHAGOGASTRODUODENOSCOPY)
Anesthesia: Moderate Sedation | Laterality: Left

## 2013-11-15 MED ORDER — HALOPERIDOL 1 MG PO TABS
1.0000 mg | ORAL_TABLET | Freq: Two times a day (BID) | ORAL | Status: DC
  Administered 2013-11-15 – 2013-11-16 (×2): 1 mg via ORAL
  Filled 2013-11-15 (×4): qty 1

## 2013-11-15 MED ORDER — FENTANYL CITRATE 0.05 MG/ML IJ SOLN
INTRAMUSCULAR | Status: AC
  Filled 2013-11-15: qty 2

## 2013-11-15 MED ORDER — MIDAZOLAM HCL 10 MG/2ML IJ SOLN
INTRAMUSCULAR | Status: DC | PRN
  Administered 2013-11-15 (×2): 1 mg via INTRAVENOUS

## 2013-11-15 MED ORDER — SODIUM CHLORIDE 0.9 % IV SOLN
INTRAVENOUS | Status: DC
  Administered 2013-11-15 – 2013-11-16 (×2): via INTRAVENOUS

## 2013-11-15 MED ORDER — MIDAZOLAM HCL 5 MG/ML IJ SOLN
INTRAMUSCULAR | Status: AC
  Filled 2013-11-15: qty 1

## 2013-11-15 MED ORDER — LORAZEPAM 2 MG/ML IJ SOLN: 1.0000 mg | Freq: Four times a day (QID) | INTRAMUSCULAR | Status: DC | PRN

## 2013-11-15 NOTE — Interval H&P Note (Signed)
History and Physical Interval Note:  11/15/2013 8:01 AM  Peytyn L Meda  has presented today for surgery, with the diagnosis of coffee ground emesis  The various methods of treatment have been discussed with the patient and family. After consideration of risks, benefits and other options for treatment, the patient has consented to  Procedure(s): ESOPHAGOGASTRODUODENOSCOPY (EGD) (Left) as a surgical intervention .  The patient's history has been reviewed, patient examined, no change in status, stable for surgery.  I have reviewed the patient's chart and labs.  Questions were answered to the patient's satisfaction.     Willis ModenaWilliam Ramello Cordial  Assessment:  1.  Coffee ground emesis.  Plan:  1.  Endoscopy. 2.  Risks (bleeding, infection, bowel perforation that could require surgery, sedation-related changes in cardiopulmonary systems), benefits (identification and possible treatment of source of symptoms, exclusion of certain causes of symptoms), and alternatives (watchful waiting, radiographic imaging studies, empiric medical treatment) of upper endoscopy (EGD) were explained to patient/family in detail and patient wishes to proceed.  Patient unable to provide consent; consent provided by son, Carolin SicksJeff Terry [phone]).

## 2013-11-15 NOTE — Progress Notes (Signed)
Physical Therapy Note  Pt for endo today.  Will f/u another time.    [address] Wild [address] CircleMegan Christian Treadway, South CarolinaPT [phone]

## 2013-11-15 NOTE — Progress Notes (Signed)
Per Dr. Kevan NyGates- anticipate d/c to SNF tomorrow. CSW has spoken with patient's son several times today. [facility] this [facility]. His sister will be available to sign admission paperwork at facility tomorrow. Fl2 on chart for MD's signature.  Patient is alert but confused at this time. She is agreeable to SNF placement for rehab.  CSW will facilitate d/c tomorrow. [employer] Medicare auth request has been sent in today for review.  Lorri Frederickonna T. West PughCrowder, LCSWA  [phone]

## 2013-11-15 NOTE — H&P (View-Only) (Signed)
Subjective: Samantha Bond had coffee-ground emesis yesterday.  Still becoming very confused at night and apparently was that way at home for quite some time as well.  Suspect that she definitely has underlying dementia regardless of hypertensive encephalopathy.  Will start Haldol 1 milligram twice daily which she seems to respond well to.  She is due for endoscopy today if we can keep an IV site in her.  Lovenox was discontinued yesterday after coffee ground emesis  Objective: Weight change: 1 lb 5.2 oz (0.6 kg)  Intake/Output Summary (Last 24 hours) at 11/15/13 0647 Last data filed at 11/14/13 1819  Gross per 24 hour  Intake 590.83 ml  Output      0 ml  Net 590.83 ml   Filed Vitals:   11/14/13 0745 11/14/13 1148 11/14/13 1402 11/15/13 0553  BP: 139/71 130/67 133/63 126/50  Pulse: 70 74 72 78  Temp:   98.6 F (37 C) 98.3 F (36.8 C)  TempSrc:   Oral Oral  Resp:   18 20  Weight:    186 lb 8.2 oz (84.6 kg)  SpO2:   95% 99%    General Appearance: Elderly sedated but arousable, confused, not agitated  Lungs: Clear to auscultation bilaterally, respirations unlabored  Heart: Regular rate and rhythm, S1 and S2 normal, no murmur, rub or gallop  Abdomen: Soft, non-tender, bowel sounds active all four quadrants, no masses, no organomegaly  Extremities: Extremities normal, atraumatic, no cyanosis or edema  Neuro: Oriented to name only. Nonfocal exam.  Lab Results: Results for orders placed during the hospital encounter of 11/06/13 (from the past 48 hour(s))  BASIC METABOLIC PANEL     Status: Abnormal   Collection Time    11/14/13  5:25 AM      Result Value Ref Range   Sodium 140  137 - 147 mEq/L   Potassium 4.2  3.7 - 5.3 mEq/L   Chloride 99  96 - 112 mEq/L   CO2 24  19 - 32 mEq/L   Glucose, Bld 179 (*) 70 - 99 mg/dL   BUN 31 (*) 6 - 23 mg/dL   Creatinine, Ser 1.39 (*) 0.50 - 1.10 mg/dL   Calcium 9.7  8.4 - 10.5 mg/dL   GFR calc non Af Amer 34 (*) >90 mL/min   GFR calc Af Amer  39 (*) >90 mL/min   Comment: (NOTE)     The eGFR has been calculated using the CKD EPI equation.     This calculation has not been validated in all clinical situations.     eGFR's persistently <90 mL/min signify possible Chronic Kidney     Disease.  GLUCOSE, CAPILLARY     Status: Abnormal   Collection Time    11/14/13  6:05 AM      Result Value Ref Range   Glucose-Capillary 158 (*) 70 - 99 mg/dL   Comment 1 Notify RN    CBC     Status: None   Collection Time    11/14/13 10:40 AM      Result Value Ref Range   WBC 4.3  4.0 - 10.5 K/uL   RBC 4.87  3.87 - 5.11 MIL/uL   Hemoglobin 13.0  12.0 - 15.0 g/dL   HCT 40.2  36.0 - 46.0 %   MCV 82.5  78.0 - 100.0 fL   MCH 26.7  26.0 - 34.0 pg   MCHC 32.3  30.0 - 36.0 g/dL   RDW 14.4  11.5 - 15.5 %   Platelets  171  150 - 400 K/uL  COMPREHENSIVE METABOLIC PANEL     Status: Abnormal   Collection Time    11/14/13 10:40 AM      Result Value Ref Range   Sodium 138  137 - 147 mEq/L   Potassium 4.6  3.7 - 5.3 mEq/L   Chloride 99  96 - 112 mEq/L   CO2 23  19 - 32 mEq/L   Glucose, Bld 138 (*) 70 - 99 mg/dL   BUN 29 (*) 6 - 23 mg/dL   Creatinine, Ser 1.27 (*) 0.50 - 1.10 mg/dL   Calcium 9.7  8.4 - 10.5 mg/dL   Total Protein 7.0  6.0 - 8.3 g/dL   Albumin 3.3 (*) 3.5 - 5.2 g/dL   AST 28  0 - 37 U/L   ALT 14  0 - 35 U/L   Alkaline Phosphatase 94  39 - 117 U/L   Total Bilirubin 0.4  0.3 - 1.2 mg/dL   GFR calc non Af Amer 37 (*) >90 mL/min   GFR calc Af Amer 43 (*) >90 mL/min   Comment: (NOTE)     The eGFR has been calculated using the CKD EPI equation.     This calculation has not been validated in all clinical situations.     eGFR's persistently <90 mL/min signify possible Chronic Kidney     Disease.  GLUCOSE, CAPILLARY     Status: None   Collection Time    11/15/13  6:02 AM      Result Value Ref Range   Glucose-Capillary 92  70 - 99 mg/dL   Comment 1 Notify RN     Comment 2 Documented in Chart      Studies/Results: No results  found. Medications: Scheduled Meds: . [facility] HOLD] aspirin EC  81 mg Oral Daily  . [facility] HOLD] diltiazem  240 mg Oral Daily  . [[facility] HOLD] famotidine (PEPCID) IV  20 mg Intravenous Q24H  . [facility] HOLD] folic acid  1 mg Oral Daily  . [MAR HOLD] glimepiride  2 mg Oral Q breakfast  . [MAR HOLD] heparin  5,000 Units Subcutaneous 3 times per day  . [facility] HOLD] hydrochlorothiazide  12.5 mg Oral Daily  . [MAR HOLD] influenza vac split quadrivalent PF  0.5 mL Intramuscular Tomorrow-1000  . [MAR HOLD] losartan  50 mg Oral Daily  . [MAR HOLD] multivitamin with minerals  1 tablet Oral Daily  . [MAR HOLD] risperiDONE  2 mg Oral Daily  . [MAR HOLD] senna  1 tablet Oral BID  . [facility] HOLD] thiamine  100 mg Oral Daily   Continuous Infusions: . sodium chloride    . sodium chloride 1,000 mL (11/14/13 1621)  . sodium chloride    . dextrose 5 % and 0.45% NaCl Stopped (11/07/13 1700)   PRN Meds:.[[facility] HOLD] acetaminophen, [MAR HOLD] acetaminophen, [MAR HOLD] alum & mag hydroxide-simeth, [MAR HOLD] bisacodyl, [MAR HOLD] haloperidol, [MAR HOLD] haloperidol lactate, [MAR HOLD] LORazepam, [MAR HOLD] LORazepam, [MAR HOLD] ondansetron (ZOFRAN) IV, [MAR HOLD] ondansetron  Assessment/Plan: Principal Problem:   Accelerated hypertension Active Problems:   DM (diabetes mellitus)   Impaired cognition   Altered mental status  Coffee-ground emesis  Principal Problem:  Accelerated hypertension - resolved Active Problems:  DM (diabetes mellitus) - controlled  Impaired cognition - responded to Haldol last night.  We'll start twice a day orally or IM.  Apparently has been sundowning at home.  Not sure that blood pressure control is going to fix things cognitively.  No seizure activity on EEG. Appreciated help from neurology. MRI was not repeated, nonfocal exam.  Altered mental status - nocturnal delirium is possibly a baseline.  May improve over time.  Episode of unconsciousness - etiology very unclear, no recurrences.  Continue to monitor. Coffee-ground emesis - upper endoscopy is planned today by Dr. Paulita Fujita Disposition - skilled nursing facility for continued control of blood pressure and assurance of compliance with medications. Hopefully she will continue to clear cognitively    LOS: 9 days   Henrine Screws, MD 11/15/2013, 6:47 AM

## 2013-11-15 NOTE — Progress Notes (Signed)
Pt still asleep after EGD  & unable to give am meds.  Dr Kevan NyGates made aware and ordered to start NS at 70ml until pt awake.  Will continue to monitor.  Amanda PeaNellie Travis Mastel, RN

## 2013-11-15 NOTE — Progress Notes (Signed)
Subjective: Samantha Bond had coffee-ground emesis yesterday.  Still becoming very confused at night and apparently was that way at home for quite some time as well.  Suspect that she definitely has underlying dementia regardless of hypertensive encephalopathy.  Will start Haldol 1 milligram twice daily which she seems to respond well to.  She is due for endoscopy today if we can keep an IV site in her.  Lovenox was discontinued yesterday after coffee ground emesis  Objective: Weight change: 1 lb 5.2 oz (0.6 kg)  Intake/Output Summary (Last 24 hours) at 11/15/13 0647 Last data filed at 11/14/13 1819  Gross per 24 hour  Intake 590.83 ml  Output      0 ml  Net 590.83 ml   Filed Vitals:   11/14/13 0745 11/14/13 1148 11/14/13 1402 11/15/13 0553  BP: 139/71 130/67 133/63 126/50  Pulse: 70 74 72 78  Temp:   98.6 F (37 C) 98.3 F (36.8 C)  TempSrc:   Oral Oral  Resp:   18 20  Weight:    186 lb 8.2 oz (84.6 kg)  SpO2:   95% 99%    General Appearance: Elderly sedated but arousable, confused, not agitated  Lungs: Clear to auscultation bilaterally, respirations unlabored  Heart: Regular rate and rhythm, S1 and S2 normal, no murmur, rub or gallop  Abdomen: Soft, non-tender, bowel sounds active all four quadrants, no masses, no organomegaly  Extremities: Extremities normal, atraumatic, no cyanosis or edema  Neuro: Oriented to name only. Nonfocal exam.  Lab Results: Results for orders placed during the hospital encounter of 11/06/13 (from the past 48 hour(s))  BASIC METABOLIC PANEL     Status: Abnormal   Collection Time    11/14/13  5:25 AM      Result Value Ref Range   Sodium 140  137 - 147 mEq/L   Potassium 4.2  3.7 - 5.3 mEq/L   Chloride 99  96 - 112 mEq/L   CO2 24  19 - 32 mEq/L   Glucose, Bld 179 (*) 70 - 99 mg/dL   BUN 31 (*) 6 - 23 mg/dL   Creatinine, Ser 1.39 (*) 0.50 - 1.10 mg/dL   Calcium 9.7  8.4 - 10.5 mg/dL   GFR calc non Af Amer 34 (*) >90 mL/min   GFR calc Af Amer  39 (*) >90 mL/min   Comment: (NOTE)     The eGFR has been calculated using the CKD EPI equation.     This calculation has not been validated in all clinical situations.     eGFR's persistently <90 mL/min signify possible Chronic Kidney     Disease.  GLUCOSE, CAPILLARY     Status: Abnormal   Collection Time    11/14/13  6:05 AM      Result Value Ref Range   Glucose-Capillary 158 (*) 70 - 99 mg/dL   Comment 1 Notify RN    CBC     Status: None   Collection Time    11/14/13 10:40 AM      Result Value Ref Range   WBC 4.3  4.0 - 10.5 K/uL   RBC 4.87  3.87 - 5.11 MIL/uL   Hemoglobin 13.0  12.0 - 15.0 g/dL   HCT 40.2  36.0 - 46.0 %   MCV 82.5  78.0 - 100.0 fL   MCH 26.7  26.0 - 34.0 pg   MCHC 32.3  30.0 - 36.0 g/dL   RDW 14.4  11.5 - 15.5 %   Platelets  171  150 - 400 K/uL  COMPREHENSIVE METABOLIC PANEL     Status: Abnormal   Collection Time    11/14/13 10:40 AM      Result Value Ref Range   Sodium 138  137 - 147 mEq/L   Potassium 4.6  3.7 - 5.3 mEq/L   Chloride 99  96 - 112 mEq/L   CO2 23  19 - 32 mEq/L   Glucose, Bld 138 (*) 70 - 99 mg/dL   BUN 29 (*) 6 - 23 mg/dL   Creatinine, Ser 1.27 (*) 0.50 - 1.10 mg/dL   Calcium 9.7  8.4 - 10.5 mg/dL   Total Protein 7.0  6.0 - 8.3 g/dL   Albumin 3.3 (*) 3.5 - 5.2 g/dL   AST 28  0 - 37 U/L   ALT 14  0 - 35 U/L   Alkaline Phosphatase 94  39 - 117 U/L   Total Bilirubin 0.4  0.3 - 1.2 mg/dL   GFR calc non Af Amer 37 (*) >90 mL/min   GFR calc Af Amer 43 (*) >90 mL/min   Comment: (NOTE)     The eGFR has been calculated using the CKD EPI equation.     This calculation has not been validated in all clinical situations.     eGFR's persistently <90 mL/min signify possible Chronic Kidney     Disease.  GLUCOSE, CAPILLARY     Status: None   Collection Time    11/15/13  6:02 AM      Result Value Ref Range   Glucose-Capillary 92  70 - 99 mg/dL   Comment 1 Notify RN     Comment 2 Documented in Chart      Studies/Results: No results  found. Medications: Scheduled Meds: . [facility] HOLD] aspirin EC  81 mg Oral Daily  . [facility] HOLD] diltiazem  240 mg Oral Daily  . [[facility] HOLD] famotidine (PEPCID) IV  20 mg Intravenous Q24H  . [facility] HOLD] folic acid  1 mg Oral Daily  . [[facility] HOLD] glimepiride  2 mg Oral Q breakfast  . [[facility] HOLD] heparin  5,000 Units Subcutaneous 3 times per day  . [facility] HOLD] hydrochlorothiazide  12.5 mg Oral Daily  . [[facility] HOLD] influenza vac split quadrivalent PF  0.5 mL Intramuscular Tomorrow-1000  . [[facility] HOLD] losartan  50 mg Oral Daily  . [[facility] HOLD] multivitamin with minerals  1 tablet Oral Daily  . [[facility] HOLD] risperiDONE  2 mg Oral Daily  . [[facility] HOLD] senna  1 tablet Oral BID  . [facility] HOLD] thiamine  100 mg Oral Daily   Continuous Infusions: . sodium chloride    . sodium chloride 1,000 mL (11/14/13 1621)  . sodium chloride    . dextrose 5 % and 0.45% NaCl Stopped (11/07/13 1700)   PRN Meds:.[[facility] HOLD] acetaminophen, [MAR HOLD] acetaminophen, [MAR HOLD] alum & mag hydroxide-simeth, [MAR HOLD] bisacodyl, [MAR HOLD] haloperidol, [MAR HOLD] haloperidol lactate, [MAR HOLD] LORazepam, [MAR HOLD] LORazepam, [MAR HOLD] ondansetron (ZOFRAN) IV, [[facility] HOLD] ondansetron  Assessment/Plan: Principal Problem:   Accelerated hypertension Active Problems:   DM (diabetes mellitus)   Impaired cognition   Altered mental status  Coffee-ground emesis  Principal Problem:  Accelerated hypertension - resolved Active Problems:  DM (diabetes mellitus) - controlled  Impaired cognition - responded to Haldol last night.  We'll start twice a day orally or IM.  Apparently has been sundowning at home.  Not sure that blood pressure control is going to fix things cognitively.  No seizure activity on EEG. Appreciated help from neurology. MRI was not repeated, nonfocal exam.  Altered mental status - nocturnal delirium is possibly a baseline.  May improve over time.  Episode of unconsciousness - etiology very unclear, no recurrences.  Continue to monitor. Coffee-ground emesis - upper endoscopy is planned today by Dr. Paulita Fujita Disposition - skilled nursing facility for continued control of blood pressure and assurance of compliance with medications. Hopefully she will continue to clear cognitively    LOS: 9 days   Henrine Screws, MD 11/15/2013, 6:47 AM

## 2013-11-15 NOTE — Op Note (Signed)
[facility] [address] [place], [id number]   ENDOSCOPY PROCEDURE REPORT  PATIENT: Samantha Bond, Samantha L.  MR#: [id number] BIRTHDATE: 10-18-1926 , 87  yrs. old GENDER: Female ENDOSCOPIST: Willis ModenaWilliam Lyndia Bury, MD REFERRED BY:  R.  Robley FriesNevill Gates, M.D. PROCEDURE DATE:  11/15/2013 PROCEDURE:  EGD, diagnostic ASA CLASS:     Class III INDICATIONS:  hematemesis. MEDICATIONS: Versed 2 mg IV TOPICAL ANESTHETIC:  DESCRIPTION OF PROCEDURE: After the risks benefits and alternatives of the procedure were thoroughly explained, informed consent was obtained.  The Pentax Gastroscope Peds [id number] endoscope was introduced through the mouth and advanced to the second portion of the duodenum. Without limitations.  The instrument was slowly withdrawn as the mucosa was fully examined.     Findings:  Moderate hiatal hernia, with associated Schatzki's ring. Mild distal esophagitis, consistent with reflux esophagitis. Otherwise normal endoscopy to the second portion of the duodenum. No ulcer, mass, AVM or other pathology identified.  No old or fresh blood seen to the extent of our examination.           The scope was then withdrawn from the patient and the procedure completed.  ENDOSCOPIC IMPRESSION:     As above.  Coffee ground emesis was likely from reflux-mediated esophagitis.  RECOMMENDATIONS:     1.  Watch for potential complications of procedure. 2.  Keep head-of-bed elevated at least 30 degrees at all times. 3.  Pepcid or Pantoprazole for the next 6 weeks. 4.  Avoid/minimize NSAIDs/blood thinners as clinically feasible. 5.  OK to slowly restart diet, as her mental status tolerates. 6.  Nothing else needed from GI perspective; will sign-off; please call with questions.  eSigned:  Willis ModenaWilliam Jefte Carithers, MD 11/15/2013 8:12 AM   CC:

## 2013-11-15 NOTE — Progress Notes (Signed)
Pt arrived from endo after EGD.  Noted to be sleeping.  VSS.  Will continue to monitor.  Suhan Paci,RN

## 2013-11-16 ENCOUNTER — Encounter (HOSPITAL_COMMUNITY): Payer: Self-pay | Admitting: Gastroenterology

## 2013-11-16 LAB — CBC WITH DIFFERENTIAL/PLATELET
BASOS ABS: 0 10*3/uL (ref 0.0–0.1)
Basophils Relative: 0 % (ref 0–1)
EOS PCT: 3 % (ref 0–5)
Eosinophils Absolute: 0.1 10*3/uL (ref 0.0–0.7)
HEMATOCRIT: 40.1 % (ref 36.0–46.0)
HEMOGLOBIN: 13 g/dL (ref 12.0–15.0)
LYMPHS ABS: 1.4 10*3/uL (ref 0.7–4.0)
LYMPHS PCT: 38 % (ref 12–46)
MCH: 26.4 pg (ref 26.0–34.0)
MCHC: 32.4 g/dL (ref 30.0–36.0)
MCV: 81.5 fL (ref 78.0–100.0)
MONO ABS: 0.4 10*3/uL (ref 0.1–1.0)
Monocytes Relative: 11 % (ref 3–12)
Neutro Abs: 1.8 10*3/uL (ref 1.7–7.7)
Neutrophils Relative %: 48 % (ref 43–77)
Platelets: 157 10*3/uL (ref 150–400)
RBC: 4.92 MIL/uL (ref 3.87–5.11)
RDW: 14.2 % (ref 11.5–15.5)
WBC: 3.8 10*3/uL — AB (ref 4.0–10.5)

## 2013-11-16 LAB — GLUCOSE, CAPILLARY: Glucose-Capillary: 111 mg/dL — ABNORMAL HIGH (ref 70–99)

## 2013-11-16 MED ORDER — ACETAMINOPHEN 325 MG PO TABS: 650.0000 mg | ORAL_TABLET | Freq: Four times a day (QID) | ORAL | Status: DC | PRN

## 2013-11-16 MED ORDER — ADULT MULTIVITAMIN W/MINERALS CH: 1.0000 | ORAL_TABLET | Freq: Every day | ORAL | Status: DC

## 2013-11-16 MED ORDER — LORAZEPAM 2 MG/ML IJ SOLN: 1.0000 mg | Freq: Four times a day (QID) | INTRAMUSCULAR | Status: DC | PRN

## 2013-11-16 MED ORDER — BISACODYL 10 MG RE SUPP: 10.0000 mg | Freq: Every day | RECTAL | Status: DC | PRN

## 2013-11-16 MED ORDER — HYDROCHLOROTHIAZIDE 12.5 MG PO CAPS: 12.5000 mg | ORAL_CAPSULE | Freq: Every day | ORAL | Status: DC

## 2013-11-16 MED ORDER — FOLIC ACID 1 MG PO TABS: 1.0000 mg | ORAL_TABLET | Freq: Every day | ORAL | Status: DC

## 2013-11-16 MED ORDER — HALOPERIDOL 1 MG PO TABS: 1.0000 mg | ORAL_TABLET | Freq: Two times a day (BID) | ORAL | Status: DC

## 2013-11-16 MED ORDER — SENNA 8.6 MG PO TABS: 1.0000 | ORAL_TABLET | Freq: Two times a day (BID) | ORAL | Status: DC

## 2013-11-16 NOTE — Progress Notes (Signed)
Physical Therapy Treatment Patient Details Name: Samantha Bond MRN: [name] DOB: January 09, 1927 Today's Date: 11/16/2013    History of Present Illness Samantha BarefootMaxwell L Bond is a 80 y.o. female presented to the ED with confusion. The son noted that she had note been acting her normal self and brought her to the ED to be evaluated. She now seems to be back to normal. She apparently has some difficulty searching for words and phrases. She had been having headaches also. She is a known hypertensive and had run out of her medications but not sure for how long she has not been taking her medications.    PT Comments    Patient making progress with mobility and gait.  Agree with need for SNF at discharge.  Follow Up Recommendations  SNF;Supervision/Assistance - 24 hour     Equipment Recommendations  Rolling walker with 5" wheels    Recommendations for Other Services       Precautions / Restrictions Precautions Precautions: Fall Restrictions Weight Bearing Restrictions: No    Mobility  Bed Mobility                  Transfers Overall transfer level: Needs assistance Equipment used: Rolling walker (2 wheeled) Transfers: Sit to/from Stand Sit to Stand: Min guard         General transfer comment: Verbal cues for hand placement and safety.  Ambulation/Gait Ambulation/Gait assistance: Min assist Ambulation Distance (Feet): 400 Feet Assistive device: Rolling walker (2 wheeled) Gait Pattern/deviations: Step-through pattern Gait velocity: decreased Gait velocity interpretation: Below normal speed for age/gender General Gait Details: Drifts to left and to right.  Patient able to maneuver RW today around obstacles.  Cues to stand upright.   Stairs            Wheelchair Mobility    Modified Rankin (Stroke Patients Only)       Balance                                    Cognition Arousal/Alertness: Awake/alert Behavior During Therapy: Flat  affect Overall Cognitive Status: No family/caregiver present to determine baseline cognitive functioning Area of Impairment: Orientation;Memory;Safety/judgement;Problem solving Orientation Level: Disoriented to;Time;Situation   Memory: Decreased short-term memory   Safety/Judgement: Decreased awareness of safety   Problem Solving: Slow processing;Difficulty sequencing;Requires verbal cues      Exercises      General Comments        Pertinent Vitals/Pain     Home Living                      Prior Function            PT Goals (current goals can now be found in the care plan section) Progress towards PT goals: Progressing toward goals    Frequency  Min 3X/week    PT Plan Current plan remains appropriate    Co-evaluation             End of Session Equipment Utilized During Treatment: Gait belt Activity Tolerance: Patient tolerated treatment well Patient left: in chair;with call bell/phone within reach;with chair alarm set     Time: 1035-1102 PT Time Calculation (min): 27 min  Charges:  $Gait Training: 23-37 mins                    G Codes:      Samantha AustriaSusan H Rowan Bond 11/16/2013, 1:18  PM Samantha Bond. Samantha Bond, [place] Pager [phone]

## 2013-11-16 NOTE — Progress Notes (Signed)
[facility]ebra Coats [facility].  Verbalized understanding.

## 2013-11-16 NOTE — Discharge Summary (Addendum)
Physician Discharge Summary  NAME:Samantha Bond  ZOX:[id number]  DOB: 05-Oct-1926   Admit date: 11/06/2013 Discharge date: 11/16/2013  Discharge Diagnoses:  Principal Problem:   Accelerated hypertension Active Problems:   DM (diabetes mellitus)   Impaired cognition   Altered mental status  Principal Problem:  Accelerated hypertension - resolved  Active Problems:  DM (diabetes mellitus) - controlled  Impaired cognition - control her blood pressure, impaired cognition does not report improved greatly.  She is more calm now.  She'll certainly has a baseline dementia now and her confusion was not entirely secondary to hypertensive encephalopathy.  Started Haldol twice daily and continue blood pressure medications at discharge.  Ativan to be used for agitation.  She has is calm now as I have seen and quite conversive.  Think she's been here for 2 days and she's been there for 9 days.  Might be useful to consider Aricept and/or Namenda as an add-on as soon. No seizure activity on EEG. Appreciated help from neurology. MRI was not repeated, nonfocal exam.  Altered mental status - nocturnal delirium is possibly a baseline.  Hopefully will improve over time  Episode of unconsciousness - etiology very unclear, no recurrences. Continue to monitor.  Coffee-ground emesis - upper endoscopy revealed esophageal inflammation, otherwise benign.  Treat with PPI Disposition - skilled nursing facility for continued monitoring of blood pressure and assurance of compliance with medications. Hopefully she will continue to clear cognitively.  She is a delightful lady whom I have cared for from the ears.  Hopefully her mental status will improve    Discharge Physical Exam:  General Appearance: Alert, cooperative, no distress, appears stated age  Weight change: -8 lb 13.1 oz (-4 kg)  Intake/Output Summary (Last 24 hours) at 11/16/13 0821 Last data filed at 11/16/13 0654  Gross per 24 hour  Intake    590 ml   Output    350 ml  Net    240 ml   Filed Vitals:   11/15/13 1418 11/15/13 1901 11/15/13 2033 11/16/13 0627  BP: 141/64 144/80 163/81 167/78  Pulse: 75  74 84  Temp: 98.5 F (36.9 C)  98.4 F (36.9 C) 97.7 F (36.5 C)  TempSrc: Oral  Oral Oral  Resp: 18  20 20   Weight:    177 lb 11.1 oz (80.6 kg)  SpO2: 96%  97% 100%    General Appearance: Alert and conversive sitting up he breakfast in the chair.  Think she's been here for 2 days but has been there for 9 days.  Wonders "what happened".  Not at all agitated.  Nonfocal exam Lungs: Clear to auscultation bilaterally, respirations unlabored  Heart: Regular rate and rhythm, S1 and S2 normal, no murmur, rub or gallop  Abdomen: Soft, non-tender, bowel sounds active all four quadrants, no masses, no organomegaly  Extremities: Extremities normal, atraumatic, no cyanosis or edema  Neuro: Oriented to name only. Nonfocal exam.   Discharge Condition: Improved but still confused  Hospital Course:  Samantha Bond is a very pleasant 80 year old female who has had an admission in the past for hypertensive encephalopathy which cleared with resumption of blood pressure medications.  She had been off medications prior to this admission and came in quite confused and agitated.  Blood pressure control, unfortunately, she has not returned to what I thought was her baseline which was relatively normal cognition.  It turns out that she is been having some confusion and agitation in the evenings at home for quite some time,  apparently, all her medications.  She likely is developing some dementia and this is not entirely hypertensive encephalopathy.  Over time in skilled nursing, this will become more clear. She did have an episode of coffee-ground emesis several days ago and upper endoscopy revealed distal esophagitis.  She is on PPI therapy now.  No other source of bleeding was noted.  Hemoglobin has remained stable and vital signs have remained  stable.  Things to follow up in the outpatient setting: Monitor blood pressure, mental status, and hemoglobin  Consults: Treatment Team:  Willis Modena, MD  Disposition: Discharged to skilled nursing facility  Discharge Orders   Future Orders Complete By Expires   Diet - low sodium heart healthy  As directed    Increase activity slowly  As directed        Medication List    STOP taking these medications       naproxen sodium 220 MG tablet  Commonly known as:  ANAPROX      TAKE these medications       acetaminophen 325 MG tablet  Commonly known as:  TYLENOL  Take 2 tablets (650 mg total) by mouth every 6 (six) hours as needed for mild pain (or Fever >/= 101).     bisacodyl 10 MG suppository  Commonly known as:  DULCOLAX  Place 1 suppository (10 mg total) rectally daily as needed for moderate constipation.     diltiazem 240 MG 24 hr capsule  Commonly known as:  CARDIZEM CD  Take 240 mg by mouth daily.     folic acid 1 MG tablet  Commonly known as:  FOLVITE  Take 1 tablet (1 mg total) by mouth daily.     glimepiride 2 MG tablet  Commonly known as:  AMARYL  Take 2 mg by mouth daily.     haloperidol 1 MG tablet  Commonly known as:  HALDOL  Take 1 tablet (1 mg total) by mouth 2 (two) times daily.     hydrochlorothiazide 12.5 MG capsule  Commonly known as:  MICROZIDE  Take 1 capsule (12.5 mg total) by mouth daily.     LORazepam 2 MG/ML injection  Commonly known as:  ATIVAN  Inject 0.5 mLs (1 mg total) into the muscle every 6 (six) hours as needed (Agitation).     losartan 50 MG tablet  Commonly known as:  COZAAR  Take 50 mg by mouth daily.     multivitamin with minerals Tabs tablet  Take 1 tablet by mouth daily.     senna 8.6 MG Tabs tablet  Commonly known as:  SENOKOT  Take 1 tablet (8.6 mg total) by mouth 2 (two) times daily.         The results of significant diagnostics from this hospitalization (including imaging, microbiology, ancillary and  laboratory) are listed below for reference.    Significant Diagnostic Studies: Ct Head Wo Contrast  11/09/2013   CLINICAL DATA:  Code stroke.  Sudden change in neuro status.  EXAM: CT HEAD WITHOUT CONTRAST  TECHNIQUE: Contiguous axial images were obtained from the base of the skull through the vertex without intravenous contrast.  COMPARISON:  Brain MRI, 11/07/2013.  Head CT, 11/06/2013.  FINDINGS: Ventricles are normal in configuration. There is ventricular and sulcal enlargement reflecting moderate atrophy. No hydrocephalus.  Patchy white matter hypoattenuation is noted consistent with advanced chronic microvascular ischemic change. There is evidence of several superimposed old deep white matter lacunar infarcts.  There is no evidence of a recent cortical infarct.  There are no extra-axial masses or abnormal fluid collections.  There is no intracranial hemorrhage.  Visualized sinuses and mastoid air cells are clear.  IMPRESSION: 1. No acute intracranial abnormalities. 2. Atrophy and advanced chronic microvascular ischemic change, stable from the prior exams. These results were called by telephone at the time of interpretation on 11/09/2013 at 2:39 PM to Dr. Roseanne Reno , who verbally acknowledged these results.   Electronically Signed   By: Amie Portland M.D.   On: 11/09/2013 14:42   Ct Head Wo Contrast  11/06/2013   CLINICAL DATA:  Elevated blood pressure, confusion, headache  EXAM: CT HEAD WITHOUT CONTRAST  TECHNIQUE: Contiguous axial images were obtained from the base of the skull through the vertex without intravenous contrast.  COMPARISON:  CT HEAD W/O CM dated 08/09/2013; CT HEAD W/O CM dated 01/12/2012  FINDINGS: Mild to moderate diffuse atrophy. Moderate low attenuation in the deep white matter. Basal ganglia calcifications. Calcification of the anterior falx. No hydrocephalus, hemorrhage, or extra-axial fluid. No evidence of vascular territory infarct. Calvarium is intact.  IMPRESSION: Moderate chronic  involutional change.  No acute findings.   Electronically Signed   By: Esperanza Heir M.D.   On: 11/06/2013 22:07   Mr Laqueta Jean ZO Contrast  11/07/2013   CLINICAL DATA:  Mental status change  EXAM: MRI HEAD WITHOUT AND WITH CONTRAST  TECHNIQUE: Multiplanar, multiecho pulse sequences of the brain and surrounding structures were obtained without and with intravenous contrast.  CONTRAST:  15 mL MultiHance IV  COMPARISON:  CT head 11/06/2013  FINDINGS: Moderate generalized atrophy. Periventricular deep white matter hyperintensity throughout the cerebral white matter bilaterally, consistent with chronic microvascular ischemia. Chronic ischemia in the basal ganglia bilaterally. 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 Brainstem and cerebellum are intact.  Negative for acute infarct.  Chronic microhemorrhage right posterior temporal lobe and left parietal lobe. Negative for mass or edema.  Postcontrast imaging reveals normal enhancement. No enhancing mass lesion is identified.  Mucous retention cyst left maxillary sinus. Remainder of the sinuses are clear.  IMPRESSION: Atrophy and chronic microvascular ischemia.  No acute abnormality.   Electronically Signed   By: Marlan Palau M.D.   On: 11/07/2013 08:54   Dg Chest Port 1 View  11/09/2013   CLINICAL DATA:  Possible aspiration  EXAM: PORTABLE CHEST - 1 VIEW  COMPARISON:  11/06/2013  FINDINGS: The heart size and mediastinal contours are within normal limits. Both lungs are clear. The visualized skeletal structures are unremarkable.  IMPRESSION: No active disease.   Electronically Signed   By: Alcide Clever M.D.   On: 11/09/2013 19:10   Dg Chest Portable 1 View  11/07/2013   CLINICAL DATA:  Hypertension.  EXAM: PORTABLE CHEST - 1 VIEW  COMPARISON:  08/09/2013  FINDINGS: Mild cardiomegaly which is chronic. There is unchanged prominence of the ascending aorta which is accentuated by rotation on the lower image. Stable appearance of the right hilum since 2012 at least. No  edema or consolidation. No effusion or pneumothorax.  IMPRESSION: Stable exam.   No active disease.   Electronically Signed   By: Tiburcio Pea M.D.   On: 11/07/2013 00:24    Microbiology: No results found for this or any previous visit (from the past 240 hour(s)).   Labs: Results for orders placed during the hospital encounter of 11/06/13  CBC WITH DIFFERENTIAL      Result Value Ref Range   WBC 3.8 (*) 4.0 - 10.5 K/uL   RBC 4.68  3.87 -  5.11 MIL/uL   Hemoglobin 12.5  12.0 - 15.0 g/dL   HCT 16.1  09.6 - 04.5 %   MCV 83.8  78.0 - 100.0 fL   MCH 26.7  26.0 - 34.0 pg   MCHC 31.9  30.0 - 36.0 g/dL   RDW 40.9  81.1 - 91.4 %   Platelets 191  150 - 400 K/uL   Neutrophils Relative % 50  43 - 77 %   Neutro Abs 1.9  1.7 - 7.7 K/uL   Lymphocytes Relative 41  12 - 46 %   Lymphs Abs 1.5  0.7 - 4.0 K/uL   Monocytes Relative 7  3 - 12 %   Monocytes Absolute 0.3  0.1 - 1.0 K/uL   Eosinophils Relative 2  0 - 5 %   Eosinophils Absolute 0.1  0.0 - 0.7 K/uL   Basophils Relative 0  0 - 1 %   Basophils Absolute 0.0  0.0 - 0.1 K/uL  COMPREHENSIVE METABOLIC PANEL      Result Value Ref Range   Sodium 142  137 - 147 mEq/L   Potassium 3.9  3.7 - 5.3 mEq/L   Chloride 102  96 - 112 mEq/L   CO2 25  19 - 32 mEq/L   Glucose, Bld 186 (*) 70 - 99 mg/dL   BUN 21  6 - 23 mg/dL   Creatinine, Ser 7.82 (*) 0.50 - 1.10 mg/dL   Calcium 9.2  8.4 - 95.6 mg/dL   Total Protein 6.8  6.0 - 8.3 g/dL   Albumin 3.6  3.5 - 5.2 g/dL   AST 23  0 - 37 U/L   ALT 13  0 - 35 U/L   Alkaline Phosphatase 105  39 - 117 U/L   Total Bilirubin 0.6  0.3 - 1.2 mg/dL   GFR calc non Af Amer 40 (*) >90 mL/min   GFR calc Af Amer 46 (*) >90 mL/min  URINALYSIS, ROUTINE W REFLEX MICROSCOPIC      Result Value Ref Range   Color, Urine YELLOW  YELLOW   APPearance CLEAR  CLEAR   Specific Gravity, Urine 1.027  1.005 - 1.030   pH 5.5  5.0 - 8.0   Glucose, UA NEGATIVE  NEGATIVE mg/dL   Hgb urine dipstick NEGATIVE  NEGATIVE   Bilirubin  Urine SMALL (*) NEGATIVE   Ketones, ur NEGATIVE  NEGATIVE mg/dL   Protein, ur NEGATIVE  NEGATIVE mg/dL   Urobilinogen, UA 1.0  0.0 - 1.0 mg/dL   Nitrite NEGATIVE  NEGATIVE   Leukocytes, UA NEGATIVE  NEGATIVE  HEPATIC FUNCTION PANEL      Result Value Ref Range   Total Protein 6.8  6.0 - 8.3 g/dL   Albumin 3.6  3.5 - 5.2 g/dL   AST 21  0 - 37 U/L   ALT 12  0 - 35 U/L   Alkaline Phosphatase 100  39 - 117 U/L   Total Bilirubin 0.5  0.3 - 1.2 mg/dL   Bilirubin, Direct <2.1  0.0 - 0.3 mg/dL   Indirect Bilirubin NOT CALCULATED  0.3 - 0.9 mg/dL  CBC      Result Value Ref Range   WBC 4.4  4.0 - 10.5 K/uL   RBC 4.41  3.87 - 5.11 MIL/uL   Hemoglobin 11.7 (*) 12.0 - 15.0 g/dL   HCT 30.8  65.7 - 84.6 %   MCV 82.5  78.0 - 100.0 fL   MCH 26.5  26.0 - 34.0 pg   MCHC 32.1  30.0 - 36.0 g/dL   RDW 16.1  09.6 - 04.5 %   Platelets 186  150 - 400 K/uL  COMPREHENSIVE METABOLIC PANEL      Result Value Ref Range   Sodium 142  137 - 147 mEq/L   Potassium 3.6 (*) 3.7 - 5.3 mEq/L   Chloride 103  96 - 112 mEq/L   CO2 26  19 - 32 mEq/L   Glucose, Bld 158 (*) 70 - 99 mg/dL   BUN 19  6 - 23 mg/dL   Creatinine, Ser 4.09 (*) 0.50 - 1.10 mg/dL   Calcium 9.2  8.4 - 81.1 mg/dL   Total Protein 6.2  6.0 - 8.3 g/dL   Albumin 3.1 (*) 3.5 - 5.2 g/dL   AST 20  0 - 37 U/L   ALT 11  0 - 35 U/L   Alkaline Phosphatase 92  39 - 117 U/L   Total Bilirubin 0.6  0.3 - 1.2 mg/dL   GFR calc non Af Amer 43 (*) >90 mL/min   GFR calc Af Amer 50 (*) >90 mL/min  APTT      Result Value Ref Range   aPTT 26  24 - 37 seconds  PROTIME-INR      Result Value Ref Range   Prothrombin Time 12.9  11.6 - 15.2 seconds   INR 0.99  0.00 - 1.49  GLUCOSE, CAPILLARY      Result Value Ref Range   Glucose-Capillary 121 (*) 70 - 99 mg/dL  GLUCOSE, CAPILLARY      Result Value Ref Range   Glucose-Capillary 107 (*) 70 - 99 mg/dL  BASIC METABOLIC PANEL      Result Value Ref Range   Sodium 140  137 - 147 mEq/L   Potassium 3.8  3.7 - 5.3  mEq/L   Chloride 101  96 - 112 mEq/L   CO2 23  19 - 32 mEq/L   Glucose, Bld 87  70 - 99 mg/dL   BUN 14  6 - 23 mg/dL   Creatinine, Ser 9.14 (*) 0.50 - 1.10 mg/dL   Calcium 9.4  8.4 - 78.2 mg/dL   GFR calc non Af Amer 44 (*) >90 mL/min   GFR calc Af Amer 51 (*) >90 mL/min  GLUCOSE, CAPILLARY      Result Value Ref Range   Glucose-Capillary 106 (*) 70 - 99 mg/dL  GLUCOSE, CAPILLARY      Result Value Ref Range   Glucose-Capillary 145 (*) 70 - 99 mg/dL  BASIC METABOLIC PANEL      Result Value Ref Range   Sodium 139  137 - 147 mEq/L   Potassium 4.2  3.7 - 5.3 mEq/L   Chloride 98  96 - 112 mEq/L   CO2 25  19 - 32 mEq/L   Glucose, Bld 136 (*) 70 - 99 mg/dL   BUN 20  6 - 23 mg/dL   Creatinine, Ser 9.56 (*) 0.50 - 1.10 mg/dL   Calcium 9.4  8.4 - 21.3 mg/dL   GFR calc non Af Amer 36 (*) >90 mL/min   GFR calc Af Amer 42 (*) >90 mL/min  TROPONIN I      Result Value Ref Range   Troponin I <0.30  <0.30 ng/mL  BASIC METABOLIC PANEL      Result Value Ref Range   Sodium 139  137 - 147 mEq/L   Potassium 4.2  3.7 - 5.3 mEq/L   Chloride 100  96 - 112 mEq/L   CO2  23  19 - 32 mEq/L   Glucose, Bld 105 (*) 70 - 99 mg/dL   BUN 21  6 - 23 mg/dL   Creatinine, Ser 1.611.39 (*) 0.50 - 1.10 mg/dL   Calcium 8.8  8.4 - 09.610.5 mg/dL   GFR calc non Af Amer 34 (*) >90 mL/min   GFR calc Af Amer 39 (*) >90 mL/min  GLUCOSE, CAPILLARY      Result Value Ref Range   Glucose-Capillary 104 (*) 70 - 99 mg/dL  CBC WITH DIFFERENTIAL      Result Value Ref Range   WBC 4.2  4.0 - 10.5 K/uL   RBC 4.74  3.87 - 5.11 MIL/uL   Hemoglobin 12.5  12.0 - 15.0 g/dL   HCT 04.539.1  40.936.0 - 81.146.0 %   MCV 82.5  78.0 - 100.0 fL   MCH 26.4  26.0 - 34.0 pg   MCHC 32.0  30.0 - 36.0 g/dL   RDW 91.414.6  78.211.5 - 95.615.5 %   Platelets 190  150 - 400 K/uL   Neutrophils Relative % 31 (*) 43 - 77 %   Neutro Abs 1.3 (*) 1.7 - 7.7 K/uL   Lymphocytes Relative 54 (*) 12 - 46 %   Lymphs Abs 2.3  0.7 - 4.0 K/uL   Monocytes Relative 12  3 - 12 %    Monocytes Absolute 0.5  0.1 - 1.0 K/uL   Eosinophils Relative 2  0 - 5 %   Eosinophils Absolute 0.1  0.0 - 0.7 K/uL   Basophils Relative 1  0 - 1 %   Basophils Absolute 0.0  0.0 - 0.1 K/uL  COMPREHENSIVE METABOLIC PANEL      Result Value Ref Range   Sodium 138  137 - 147 mEq/L   Potassium 4.1  3.7 - 5.3 mEq/L   Chloride 98  96 - 112 mEq/L   CO2 23  19 - 32 mEq/L   Glucose, Bld 87  70 - 99 mg/dL   BUN 35 (*) 6 - 23 mg/dL   Creatinine, Ser 2.131.84 (*) 0.50 - 1.10 mg/dL   Calcium 9.4  8.4 - 08.610.5 mg/dL   Total Protein 6.7  6.0 - 8.3 g/dL   Albumin 3.2 (*) 3.5 - 5.2 g/dL   AST 23  0 - 37 U/L   ALT 12  0 - 35 U/L   Alkaline Phosphatase 87  39 - 117 U/L   Total Bilirubin 0.6  0.3 - 1.2 mg/dL   GFR calc non Af Amer 24 (*) >90 mL/min   GFR calc Af Amer 28 (*) >90 mL/min  GLUCOSE, CAPILLARY      Result Value Ref Range   Glucose-Capillary 71  70 - 99 mg/dL  GLUCOSE, CAPILLARY      Result Value Ref Range   Glucose-Capillary 99  70 - 99 mg/dL  BASIC METABOLIC PANEL      Result Value Ref Range   Sodium 135 (*) 137 - 147 mEq/L   Potassium 4.2  3.7 - 5.3 mEq/L   Chloride 99  96 - 112 mEq/L   CO2 21  19 - 32 mEq/L   Glucose, Bld 92  70 - 99 mg/dL   BUN 34 (*) 6 - 23 mg/dL   Creatinine, Ser 5.781.45 (*) 0.50 - 1.10 mg/dL   Calcium 8.8  8.4 - 46.910.5 mg/dL   GFR calc non Af Amer 32 (*) >90 mL/min   GFR calc Af Amer 37 (*) >90 mL/min  GLUCOSE, CAPILLARY  Result Value Ref Range   Glucose-Capillary 92  70 - 99 mg/dL   Comment 1 Notify RN     Comment 2 Documented in Chart    GLUCOSE, CAPILLARY      Result Value Ref Range   Glucose-Capillary 97  70 - 99 mg/dL  BASIC METABOLIC PANEL      Result Value Ref Range   Sodium 140  137 - 147 mEq/L   Potassium 4.2  3.7 - 5.3 mEq/L   Chloride 99  96 - 112 mEq/L   CO2 24  19 - 32 mEq/L   Glucose, Bld 179 (*) 70 - 99 mg/dL   BUN 31 (*) 6 - 23 mg/dL   Creatinine, Ser 1.61 (*) 0.50 - 1.10 mg/dL   Calcium 9.7  8.4 - 09.6 mg/dL   GFR calc non Af Amer 34  (*) >90 mL/min   GFR calc Af Amer 39 (*) >90 mL/min  GLUCOSE, CAPILLARY      Result Value Ref Range   Glucose-Capillary 158 (*) 70 - 99 mg/dL   Comment 1 Notify RN    CBC      Result Value Ref Range   WBC 4.3  4.0 - 10.5 K/uL   RBC 4.87  3.87 - 5.11 MIL/uL   Hemoglobin 13.0  12.0 - 15.0 g/dL   HCT 04.5  40.9 - 81.1 %   MCV 82.5  78.0 - 100.0 fL   MCH 26.7  26.0 - 34.0 pg   MCHC 32.3  30.0 - 36.0 g/dL   RDW 91.4  78.2 - 95.6 %   Platelets 171  150 - 400 K/uL  COMPREHENSIVE METABOLIC PANEL      Result Value Ref Range   Sodium 138  137 - 147 mEq/L   Potassium 4.6  3.7 - 5.3 mEq/L   Chloride 99  96 - 112 mEq/L   CO2 23  19 - 32 mEq/L   Glucose, Bld 138 (*) 70 - 99 mg/dL   BUN 29 (*) 6 - 23 mg/dL   Creatinine, Ser 2.13 (*) 0.50 - 1.10 mg/dL   Calcium 9.7  8.4 - 08.6 mg/dL   Total Protein 7.0  6.0 - 8.3 g/dL   Albumin 3.3 (*) 3.5 - 5.2 g/dL   AST 28  0 - 37 U/L   ALT 14  0 - 35 U/L   Alkaline Phosphatase 94  39 - 117 U/L   Total Bilirubin 0.4  0.3 - 1.2 mg/dL   GFR calc non Af Amer 37 (*) >90 mL/min   GFR calc Af Amer 43 (*) >90 mL/min  GLUCOSE, CAPILLARY      Result Value Ref Range   Glucose-Capillary 92  70 - 99 mg/dL   Comment 1 Notify RN     Comment 2 Documented in Chart    GLUCOSE, CAPILLARY      Result Value Ref Range   Glucose-Capillary 96  70 - 99 mg/dL  CBC WITH DIFFERENTIAL      Result Value Ref Range   WBC 3.8 (*) 4.0 - 10.5 K/uL   RBC 4.92  3.87 - 5.11 MIL/uL   Hemoglobin 13.0  12.0 - 15.0 g/dL   HCT 57.8  46.9 - 62.9 %   MCV 81.5  78.0 - 100.0 fL   MCH 26.4  26.0 - 34.0 pg   MCHC 32.4  30.0 - 36.0 g/dL   RDW 52.8  41.3 - 24.4 %   Platelets 157  150 - 400 K/uL  Neutrophils Relative % 48  43 - 77 %   Neutro Abs 1.8  1.7 - 7.7 K/uL   Lymphocytes Relative 38  12 - 46 %   Lymphs Abs 1.4  0.7 - 4.0 K/uL   Monocytes Relative 11  3 - 12 %   Monocytes Absolute 0.4  0.1 - 1.0 K/uL   Eosinophils Relative 3  0 - 5 %   Eosinophils Absolute 0.1  0.0 - 0.7 K/uL    Basophils Relative 0  0 - 1 %   Basophils Absolute 0.0  0.0 - 0.1 K/uL  GLUCOSE, CAPILLARY      Result Value Ref Range   Glucose-Capillary 111 (*) 70 - 99 mg/dL  CBG MONITORING, ED      Result Value Ref Range   Glucose-Capillary 166 (*) 70 - 99 mg/dL   Comment 1 Documented in Chart     Comment 2 Notify RN    I-STAT TROPOININ, ED      Result Value Ref Range   Troponin i, poc 0.01  0.00 - 0.08 ng/mL   Comment 3             Time coordinating discharge: 35 minutes examining patient reviewing chart and performing discharge summary  Signed: Pearla Dubonnet, MD 11/16/2013, 8:21 AM

## 2013-11-16 NOTE — Progress Notes (Signed)
[facility]  Trebor Galdamez, Charity fundraiserN.

## 2014-03-17 ENCOUNTER — Emergency Department (HOSPITAL_COMMUNITY): Payer: Medicare Other

## 2014-03-17 ENCOUNTER — Encounter (HOSPITAL_COMMUNITY): Payer: Self-pay | Admitting: Emergency Medicine

## 2014-03-17 ENCOUNTER — Emergency Department (HOSPITAL_COMMUNITY)
Admission: EM | Admit: 2014-03-17 | Discharge: 2014-03-17 | Disposition: A | Discharge status: Home / Self Care | Payer: Medicare Other | Attending: Emergency Medicine | Admitting: Emergency Medicine

## 2014-03-17 DIAGNOSIS — Z880 Allergy status to penicillin: Secondary | ICD-10-CM | POA: Insufficient documentation

## 2014-03-17 DIAGNOSIS — Z79899 Other long term (current) drug therapy: Secondary | ICD-10-CM | POA: Insufficient documentation

## 2014-03-17 DIAGNOSIS — I10 Essential (primary) hypertension: Secondary | ICD-10-CM | POA: Insufficient documentation

## 2014-03-17 DIAGNOSIS — I158 Other secondary hypertension: Secondary | ICD-10-CM | POA: Insufficient documentation

## 2014-03-17 DIAGNOSIS — I159 Secondary hypertension, unspecified: Secondary | ICD-10-CM

## 2014-03-17 DIAGNOSIS — F0390 Unspecified dementia without behavioral disturbance: Secondary | ICD-10-CM | POA: Diagnosis not present

## 2014-03-17 DIAGNOSIS — Z8739 Personal history of other diseases of the musculoskeletal system and connective tissue: Secondary | ICD-10-CM | POA: Insufficient documentation

## 2014-03-17 DIAGNOSIS — E119 Type 2 diabetes mellitus without complications: Secondary | ICD-10-CM | POA: Diagnosis not present

## 2014-03-17 DIAGNOSIS — Z8669 Personal history of other diseases of the nervous system and sense organs: Secondary | ICD-10-CM | POA: Insufficient documentation

## 2014-03-17 LAB — BASIC METABOLIC PANEL
Anion gap: 13 (ref 5–15)
BUN: 16 mg/dL (ref 6–23)
CO2: 25 mEq/L (ref 19–32)
CREATININE: 1.08 mg/dL (ref 0.50–1.10)
Calcium: 9.2 mg/dL (ref 8.4–10.5)
Chloride: 104 mEq/L (ref 96–112)
GFR, EST AFRICAN AMERICAN: 53 mL/min — AB (ref >90)
GFR, EST NON AFRICAN AMERICAN: 45 mL/min — AB (ref >90)
GLUCOSE: 97 mg/dL (ref 70–99)
POTASSIUM: 3.7 meq/L (ref 3.7–5.3)
Sodium: 142 mEq/L (ref 137–147)

## 2014-03-17 LAB — CBC
HEMATOCRIT: 37.2 % (ref 36.0–46.0)
Hemoglobin: 11.9 g/dL — ABNORMAL LOW (ref 12.0–15.0)
MCH: 26.2 pg (ref 26.0–34.0)
MCHC: 32 g/dL (ref 30.0–36.0)
MCV: 81.8 fL (ref 78.0–100.0)
Platelets: 178 10*3/uL (ref 150–400)
RBC: 4.55 MIL/uL (ref 3.87–5.11)
RDW: 14.2 % (ref 11.5–15.5)
WBC: 4.3 10*3/uL (ref 4.0–10.5)

## 2014-03-17 MED ORDER — LOSARTAN POTASSIUM 50 MG PO TABS: 50.0000 mg | ORAL_TABLET | Freq: Every day | ORAL | Status: DC

## 2014-03-17 MED ORDER — DILTIAZEM HCL ER COATED BEADS 240 MG PO CP24: 240.0000 mg | ORAL_CAPSULE | Freq: Every day | ORAL | Status: DC

## 2014-03-17 MED ORDER — HYDROCHLOROTHIAZIDE 12.5 MG PO CAPS: 12.5000 mg | ORAL_CAPSULE | Freq: Every day | ORAL | Status: DC

## 2014-03-17 MED ORDER — HYDROCHLOROTHIAZIDE 25 MG PO TABS
25.0000 mg | ORAL_TABLET | Freq: Every day | ORAL | Status: DC
  Administered 2014-03-17: 25 mg via ORAL
  Filled 2014-03-17: qty 1

## 2014-03-17 MED ORDER — SODIUM CHLORIDE 0.9 % IV BOLUS (SEPSIS)
1000.0000 mL | Freq: Once | INTRAVENOUS | Status: AC
  Administered 2014-03-17: 1000 mL via INTRAVENOUS

## 2014-03-17 MED ORDER — LABETALOL HCL 5 MG/ML IV SOLN
10.0000 mg | Freq: Once | INTRAVENOUS | Status: AC
  Administered 2014-03-17: 10 mg via INTRAVENOUS
  Filled 2014-03-17: qty 4

## 2014-03-17 NOTE — Discharge Instructions (Signed)

## 2014-03-17 NOTE — ED Provider Notes (Signed)
CSN: [id number]     Arrival date & time 03/17/14  1720 History   First MD Initiated Contact with Patient 03/17/14 1721     Chief Complaint  Patient presents with  . Hypertension    Level V caveat: Dementia  HPI Patient presents to the emergency department after being noted by her family friend who lives with her to be hypertensive.  EMS was called her blood pressure is 240/120.  The patient's housemate states that she was found lying on the ground yesterday but otherwise seemed to be in her normal state of health.  When she came to the house today she decided to check vital signs even though the patient was without any significant complaints.  She said that her pulse ox was normal and that her heart rate was a little bit lower than normal in the 60s that she was hypertensive.  It is not clear whether or not she is compliant with her medications.  EMS reports that they were unable to find any pill bottles in the house.   Patient has dementia and therefore cannot provide any significant history however she does report that she feels fine at this time about to go home.  She does deny chest pain and shortness of breath.  She also denies abdominal pain and headache.  She does not remember or anything about how she ended up on the floor last night.  Family reports baseline dementia and baseline mental status at this time    Past Medical History  Diagnosis Date  . Hypertension   . Diabetes mellitus   . DJD (degenerative joint disease)   . Peripheral neuropathy   . Mild aortic sclerosis   . Spinal stenosis   . Diverticulosis   . DJD (degenerative joint disease)     knees, back & cervical   Past Surgical History  Procedure Laterality Date  . Cholecystectomy    . Esophagogastroduodenoscopy Left 11/15/2013    Procedure: ESOPHAGOGASTRODUODENOSCOPY (EGD);  Surgeon: Willis ModenaWilliam Outlaw, MD;  Location: [facility] ENDOSCOPY;  Service: Endoscopy;  Laterality: Left;   No family history on file. History  Substance  Use Topics  . Smoking status: Never Smoker   . Smokeless tobacco: Not on file  . Alcohol Use: No   OB History   Grav Para Term Preterm Abortions TAB SAB Ect Mult Living                 Review of Systems  Unable to perform ROS: Dementia      Allergies  Lyrica; Metformin and related; Toprol xl; Benicar; Vicodin; and Penicillins  Home Medications   Prior to Admission medications   Medication Sig Start Date End Date Taking? Authorizing Provider  diltiazem (CARDIZEM CD) 240 MG 24 hr capsule Take 240 mg by mouth daily.    Historical Provider, MD  folic acid (FOLVITE) 1 MG tablet Take 1 tablet (1 mg total) by mouth daily. 11/16/13   Marden Nobleobert Gates, MD  glimepiride (AMARYL) 2 MG tablet Take 2 mg by mouth daily.    Historical Provider, MD  haloperidol (HALDOL) 1 MG tablet Take 1 tablet (1 mg total) by mouth 2 (two) times daily. 11/16/13   Marden Nobleobert Gates, MD  hydrochlorothiazide (MICROZIDE) 12.5 MG capsule Take 1 capsule (12.5 mg total) by mouth daily. 11/16/13   Marden Nobleobert Gates, MD  LORazepam (ATIVAN) 2 MG/ML injection Inject 0.5 mLs (1 mg total) into the muscle every 6 (six) hours as needed (Agitation). 11/16/13   Marden Nobleobert Gates, MD  losartan (COZAAR)  50 MG tablet Take 50 mg by mouth daily.    Historical Provider, MD  Memantine HCl ER (NAMENDA XR) 7 MG CP24 Take 7 mg by mouth daily.    Historical Provider, MD  Multiple Vitamin (MULTIVITAMIN WITH MINERALS) TABS tablet Take 1 tablet by mouth daily. 11/16/13   Marden Noble, MD  senna (SENOKOT) 8.6 MG TABS tablet Take 1 tablet (8.6 mg total) by mouth 2 (two) times daily. 11/16/13   Marden Noble, MD   BP 223/82  Pulse 62  Temp(Src) 97.9 F (36.6 C) (Oral)  Resp 23  SpO2 99% Physical Exam  Nursing note and vitals reviewed. Constitutional: She appears well-developed and well-nourished. No distress.  HENT:  Head: Normocephalic and atraumatic.  Eyes: EOM are normal.  Neck: Normal range of motion.  Cardiovascular: Normal rate, regular rhythm and normal  heart sounds.   Pulmonary/Chest: Effort normal and breath sounds normal.  Abdominal: Soft. She exhibits no distension. There is no tenderness.  Musculoskeletal: Normal range of motion.  Neurological: She is alert.  Oriented to person and place but not time.  5 out of 5 strength in bilateral upper lower extremity major muscle groups  Skin: Skin is warm and dry.  Psychiatric: She has a normal mood and affect. Judgment normal.    ED Course  Procedures (including critical care time) Labs Review Labs Reviewed  CBC - Abnormal; Notable for the following:    Hemoglobin 11.9 (*)    All other components within normal limits  BASIC METABOLIC PANEL - Abnormal; Notable for the following:    GFR calc non Af Amer 45 (*)    GFR calc Af Amer 53 (*)    All other components within normal limits    Imaging Review Dg Chest 2 View  03/17/2014   CLINICAL DATA:  Elevated blood pressure.  EXAM: CHEST  2 VIEW  COMPARISON:  11/09/2013.  FINDINGS: Cardiomegaly. Calcified tortuous aorta. Mild vascular congestion. No infiltrates or failure. No effusion or pneumothorax.  IMPRESSION: Cardiomegaly. Slight worsening aeration with vascular congestion since the prior exam. No infiltrates or failure.   Electronically Signed   By: Davonna Belling M.D.   On: 03/17/2014 19:26     EKG Interpretation   Date/Time:  Sunday March 17 2014 17:28:37 EDT Ventricular Rate:  65 PR Interval:  156 QRS Duration: 92 QT Interval:  443 QTC Calculation: 461 R Axis:   43 Text Interpretation:  Sinus rhythm Prominent P waves, nondiagnostic  Abnormal R-wave progression, early transition Consider left ventricular  hypertrophy nonspecific T waves laterally as compared to prior ecg  Confirmed by Faizan Geraci  MD, Caryn Bee ([id number]) on 03/17/2014 5:31:14 PM      MDM   Final diagnoses:  None    7:44 PM Patient continues to be without complaint.  Blood pressure is 217/92.  It sounds like there is an issue possibly with appropriate dosing of her  medication at home.  I will  prescribe her blood pressure medications as they've been prescribed to her before in the past.  I stressed the importance of taking her blood pressure medicines and her other medications for that matter.  Close PCP followup.  No indication for additional testing in ER.  Asymptomatic hypertension    Lyanne Co, MD 03/17/14 1944

## 2014-03-17 NOTE — ED Notes (Signed)
NAD noted. Pt alert and oriented.  Family in waiting room .Pt and family given discharge instructions and prescriptions reviewed.

## 2014-03-17 NOTE — ED Notes (Signed)
Dr. Campos at bedside   

## 2014-03-17 NOTE — ED Notes (Signed)
Patient transported to X-ray 

## 2014-03-17 NOTE — ED Notes (Signed)
Per EMS, pt comes from home, lives with family friend. Pt  A&OX4, NAD noted. Pt has h/o dementia, DM, AFIB, and HTN. Pt family member took BP and reading was high. EMS was called. BP on arrival 240/120 RA, 224/116 LA. Last BP 224/104, P72, R16, 96% rm air, CBG 101. 20G IV placed in left AC.

## 2015-03-03 ENCOUNTER — Other Ambulatory Visit (HOSPITAL_COMMUNITY): Payer: Medicare Other

## 2015-03-03 ENCOUNTER — Emergency Department (HOSPITAL_COMMUNITY): Payer: Medicare Other

## 2015-03-03 ENCOUNTER — Inpatient Hospital Stay (HOSPITAL_COMMUNITY): Payer: Medicare Other

## 2015-03-03 ENCOUNTER — Inpatient Hospital Stay (HOSPITAL_COMMUNITY)
Admission: EM | Admit: 2015-03-03 | Discharge: 2015-03-07 | DRG: 069 | Disposition: A | Discharge status: Skilled Nursing Facility | Payer: Medicare Other | Attending: Internal Medicine | Admitting: Internal Medicine

## 2015-03-03 ENCOUNTER — Encounter (HOSPITAL_COMMUNITY): Payer: Self-pay

## 2015-03-03 DIAGNOSIS — E1142 Type 2 diabetes mellitus with diabetic polyneuropathy: Secondary | ICD-10-CM | POA: Diagnosis present

## 2015-03-03 DIAGNOSIS — E1122 Type 2 diabetes mellitus with diabetic chronic kidney disease: Secondary | ICD-10-CM | POA: Diagnosis present

## 2015-03-03 DIAGNOSIS — N189 Chronic kidney disease, unspecified: Secondary | ICD-10-CM | POA: Diagnosis present

## 2015-03-03 DIAGNOSIS — F0391 Unspecified dementia with behavioral disturbance: Secondary | ICD-10-CM | POA: Diagnosis not present

## 2015-03-03 DIAGNOSIS — F0390 Unspecified dementia without behavioral disturbance: Secondary | ICD-10-CM | POA: Diagnosis present

## 2015-03-03 DIAGNOSIS — G459 Transient cerebral ischemic attack, unspecified: Principal | ICD-10-CM | POA: Diagnosis present

## 2015-03-03 DIAGNOSIS — Z6827 Body mass index (BMI) 27.0-27.9, adult: Secondary | ICD-10-CM

## 2015-03-03 DIAGNOSIS — M4800 Spinal stenosis, site unspecified: Secondary | ICD-10-CM | POA: Diagnosis present

## 2015-03-03 DIAGNOSIS — R410 Disorientation, unspecified: Secondary | ICD-10-CM

## 2015-03-03 DIAGNOSIS — E860 Dehydration: Secondary | ICD-10-CM | POA: Diagnosis present

## 2015-03-03 DIAGNOSIS — I129 Hypertensive chronic kidney disease with stage 1 through stage 4 chronic kidney disease, or unspecified chronic kidney disease: Secondary | ICD-10-CM | POA: Diagnosis present

## 2015-03-03 DIAGNOSIS — I10 Essential (primary) hypertension: Secondary | ICD-10-CM | POA: Diagnosis present

## 2015-03-03 DIAGNOSIS — I6789 Other cerebrovascular disease: Secondary | ICD-10-CM | POA: Diagnosis not present

## 2015-03-03 DIAGNOSIS — E669 Obesity, unspecified: Secondary | ICD-10-CM | POA: Diagnosis present

## 2015-03-03 DIAGNOSIS — I639 Cerebral infarction, unspecified: Secondary | ICD-10-CM | POA: Diagnosis present

## 2015-03-03 DIAGNOSIS — Z79899 Other long term (current) drug therapy: Secondary | ICD-10-CM | POA: Diagnosis not present

## 2015-03-03 DIAGNOSIS — M25551 Pain in right hip: Secondary | ICD-10-CM | POA: Diagnosis not present

## 2015-03-03 DIAGNOSIS — E785 Hyperlipidemia, unspecified: Secondary | ICD-10-CM | POA: Diagnosis present

## 2015-03-03 DIAGNOSIS — E119 Type 2 diabetes mellitus without complications: Secondary | ICD-10-CM

## 2015-03-03 DIAGNOSIS — N179 Acute kidney failure, unspecified: Secondary | ICD-10-CM | POA: Diagnosis not present

## 2015-03-03 LAB — COMPREHENSIVE METABOLIC PANEL
ALT: 12 U/L — ABNORMAL LOW (ref 14–54)
AST: 25 U/L (ref 15–41)
Albumin: 3.6 g/dL (ref 3.5–5.0)
Alkaline Phosphatase: 95 U/L (ref 38–126)
Anion gap: 11 (ref 5–15)
BUN: 44 mg/dL — ABNORMAL HIGH (ref 6–20)
CHLORIDE: 105 mmol/L (ref 101–111)
CO2: 25 mmol/L (ref 22–32)
Calcium: 9.1 mg/dL (ref 8.9–10.3)
Creatinine, Ser: 2.5 mg/dL — ABNORMAL HIGH (ref 0.44–1.00)
GFR calc Af Amer: 19 mL/min — ABNORMAL LOW (ref >60)
GFR calc non Af Amer: 16 mL/min — ABNORMAL LOW (ref >60)
Glucose, Bld: 135 mg/dL — ABNORMAL HIGH (ref 65–99)
Potassium: 3.6 mmol/L (ref 3.5–5.1)
Sodium: 141 mmol/L (ref 135–145)
TOTAL PROTEIN: 7 g/dL (ref 6.5–8.1)
Total Bilirubin: 0.8 mg/dL (ref 0.3–1.2)

## 2015-03-03 LAB — GLUCOSE, CAPILLARY
Glucose-Capillary: 139 mg/dL — ABNORMAL HIGH (ref 65–99)
Glucose-Capillary: 127 mg/dL — ABNORMAL HIGH (ref 65–99)

## 2015-03-03 LAB — URINALYSIS, ROUTINE W REFLEX MICROSCOPIC
GLUCOSE, UA: NEGATIVE mg/dL
HGB URINE DIPSTICK: NEGATIVE
Ketones, ur: 15 mg/dL — AB
LEUKOCYTES UA: NEGATIVE
NITRITE: NEGATIVE
PH: 5 (ref 5.0–8.0)
Protein, ur: 30 mg/dL — AB
SPECIFIC GRAVITY, URINE: 1.026 (ref 1.005–1.030)
Urobilinogen, UA: 1 mg/dL (ref 0.0–1.0)

## 2015-03-03 LAB — URINE MICROSCOPIC-ADD ON

## 2015-03-03 LAB — I-STAT CHEM 8, ED
BUN: 47 mg/dL — AB (ref 6–20)
CREATININE: 2.5 mg/dL — AB (ref 0.44–1.00)
Calcium, Ion: 1.12 mmol/L — ABNORMAL LOW (ref 1.13–1.30)
Chloride: 103 mmol/L (ref 101–111)
Glucose, Bld: 131 mg/dL — ABNORMAL HIGH (ref 65–99)
HCT: 45 % (ref 36.0–46.0)
HEMOGLOBIN: 15.3 g/dL — AB (ref 12.0–15.0)
Potassium: 3.6 mmol/L (ref 3.5–5.1)
SODIUM: 141 mmol/L (ref 135–145)
TCO2: 22 mmol/L (ref 0–100)

## 2015-03-03 LAB — CBC
HCT: 41.5 % (ref 36.0–46.0)
Hemoglobin: 13.3 g/dL (ref 12.0–15.0)
MCH: 26.4 pg (ref 26.0–34.0)
MCHC: 32 g/dL (ref 30.0–36.0)
MCV: 82.3 fL (ref 78.0–100.0)
Platelets: 184 10*3/uL (ref 150–400)
RBC: 5.04 MIL/uL (ref 3.87–5.11)
RDW: 14.9 % (ref 11.5–15.5)
WBC: 4.3 10*3/uL (ref 4.0–10.5)

## 2015-03-03 LAB — I-STAT TROPONIN, ED: Troponin i, poc: 0.03 ng/mL (ref 0.00–0.08)

## 2015-03-03 LAB — CBG MONITORING, ED: Glucose-Capillary: 124 mg/dL — ABNORMAL HIGH (ref 65–99)

## 2015-03-03 MED ORDER — SENNOSIDES-DOCUSATE SODIUM 8.6-50 MG PO TABS: 1.0000 | ORAL_TABLET | Freq: Every evening | ORAL | Status: DC | PRN

## 2015-03-03 MED ORDER — HALOPERIDOL 1 MG PO TABS: 0.5000 mg | ORAL_TABLET | Freq: Two times a day (BID) | ORAL | Status: DC | PRN

## 2015-03-03 MED ORDER — INSULIN ASPART 100 UNIT/ML ~~LOC~~ SOLN
0.0000 [IU] | SUBCUTANEOUS | Status: DC
  Administered 2015-03-03: 1 [IU] via SUBCUTANEOUS
  Administered 2015-03-04 (×2): 2 [IU] via SUBCUTANEOUS
  Administered 2015-03-05 (×3): 1 [IU] via SUBCUTANEOUS
  Administered 2015-03-05: 2 [IU] via SUBCUTANEOUS
  Administered 2015-03-05: 1 [IU] via SUBCUTANEOUS

## 2015-03-03 MED ORDER — LORAZEPAM 2 MG/ML IJ SOLN
1.0000 mg | Freq: Once | INTRAMUSCULAR | Status: AC
  Administered 2015-03-03: 1 mg via INTRAVENOUS
  Filled 2015-03-03: qty 1

## 2015-03-03 MED ORDER — SODIUM CHLORIDE 0.9 % IV BOLUS (SEPSIS)
1000.0000 mL | Freq: Once | INTRAVENOUS | Status: AC
  Administered 2015-03-03: 1000 mL via INTRAVENOUS

## 2015-03-03 MED ORDER — DILTIAZEM HCL ER COATED BEADS 240 MG PO CP24
240.0000 mg | ORAL_CAPSULE | Freq: Every day | ORAL | Status: DC
  Administered 2015-03-04 – 2015-03-05 (×2): 240 mg via ORAL
  Filled 2015-03-03 (×2): qty 1

## 2015-03-03 MED ORDER — STROKE: EARLY STAGES OF RECOVERY BOOK
Freq: Once | Status: AC
  Administered 2015-03-03: 18:00:00

## 2015-03-03 MED ORDER — SODIUM CHLORIDE 0.9 % IV SOLN
INTRAVENOUS | Status: AC
  Administered 2015-03-03: 17:00:00 via INTRAVENOUS

## 2015-03-03 MED ORDER — ASPIRIN 300 MG RE SUPP
300.0000 mg | Freq: Every day | RECTAL | Status: DC
  Administered 2015-03-03: 300 mg via RECTAL
  Filled 2015-03-03 (×2): qty 1

## 2015-03-03 MED ORDER — HEPARIN SODIUM (PORCINE) 5000 UNIT/ML IJ SOLN
5000.0000 [IU] | Freq: Three times a day (TID) | INTRAMUSCULAR | Status: DC
  Administered 2015-03-03 – 2015-03-07 (×9): 5000 [IU] via SUBCUTANEOUS
  Filled 2015-03-03 (×11): qty 1

## 2015-03-03 MED ORDER — SENNA 8.6 MG PO TABS
1.0000 | ORAL_TABLET | Freq: Two times a day (BID) | ORAL | Status: DC
  Administered 2015-03-04 – 2015-03-07 (×5): 8.6 mg via ORAL
  Filled 2015-03-03 (×7): qty 1

## 2015-03-03 MED ORDER — HALOPERIDOL LACTATE 5 MG/ML IJ SOLN
2.5000 mg | Freq: Once | INTRAMUSCULAR | Status: AC
  Administered 2015-03-03 (×2): 2.5 mg via INTRAVENOUS
  Filled 2015-03-03: qty 1

## 2015-03-03 MED ORDER — ASPIRIN 325 MG PO TABS
325.0000 mg | ORAL_TABLET | Freq: Every day | ORAL | Status: DC
  Administered 2015-03-04 – 2015-03-07 (×4): 325 mg via ORAL
  Filled 2015-03-03 (×4): qty 1

## 2015-03-03 MED ORDER — SODIUM CHLORIDE 0.9 % IV SOLN
INTRAVENOUS | Status: DC
  Administered 2015-03-04: 09:00:00 via INTRAVENOUS

## 2015-03-03 NOTE — ED Notes (Signed)
Per EMS - pt last seen normal unknown. Pt exemplifying right-sided facial droop, gaze, and leaning. No weakness. Pt covered in vomit and urine. Possible hx dementia. Pt not communicating much verbally. BP 156/92, hr 78, RR 20, 98% RA, CBG 108. Lives with son. Ambulated to truck w/ assistance. No IV access. Not very cooperative with directions.

## 2015-03-03 NOTE — Progress Notes (Signed)
Pt arrived to 4n14 via stretcher.  Pt alert in no apparent distress.  Pt welcomed and settled into the room.  Telemetry applied and CCMD notified.  Will continue to monitor. Sondra Come, RN

## 2015-03-03 NOTE — H&P (Signed)
[facility] History and Physical                                                                                    Samantha Bond, is a 81 y.o. female  MRN: [id number]   DOB - 1927/07/24  Admit Date - 03/03/2015  Outpatient Primary MD for the patient is Samantha Dubonnet, MD  Referring Physician:  Dr.Yao  Chief Complaint:   Chief Complaint  Patient presents with  . Altered Mental Status     HPI  Samantha Bond  is a 81 y.o. female, with dementia, hypertension, diabetes mellitus with peripheral neuropathy.She was brought to the emergency department from home with presumed stroke. Her son sleeps at the patient's house at night, and her daughter brings her lunch during the day. The patient was last seen normal by her son at 7 AM when he left for work. She was sitting on the couch in the living room. Her daughter found her at 1 PM slumped over on the couch laying in emesis and urine. Her mother had decreased responsiveness, she was unable to walk, had facial droop with right-sided weakness. Per her daughter, Samantha Bond, the patient has been sleeping a lot lately. She feels she may not have been eating as she should be. Samantha Bond reports that her mother goes back into her bedroom and locks the door. Consequently, Samantha Bond does not know for sure whether or not she is eating.  In the emergency department the patient is found to have an elevated creatinine and hemoglobin. She initially had right facial droop and right-sided weakness. On my exam the symptoms appear to have resolved. CT head is negative. Urinalysis is clean. Chest x-ray is negative for opacities or infiltrates.  Review of Systems   In addition to the HPI above, she complains of bilateral knee pain, and generalized pain all over No Fever-chills, No Headache, No changes with Vision or hearing, No problems swallowing food or Liquids, No Chest pain, Cough or Shortness of Breath, No Abdominal pain, No Nausea or Vomiting, Bowel  movements are regular, No Blood in stool or Urine, No dysuria, No new skin rashes or bruises, No recent weight gain or loss, A full 10 point Review of Systems was done, except as stated above, all other Review of Systems were negative.  Past Medical History  Past Medical History  Diagnosis Date  . Hypertension   . Diabetes mellitus   . DJD (degenerative joint disease)   . Peripheral neuropathy   . Mild aortic sclerosis   . Spinal stenosis   . Diverticulosis   . DJD (degenerative joint disease)     knees, back & cervical    Past Surgical History  Procedure Laterality Date  . Cholecystectomy    . Esophagogastroduodenoscopy Left 11/15/2013    Procedure: ESOPHAGOGASTRODUODENOSCOPY (EGD);  Surgeon: Willis Modena, MD;  Location: [facility] ENDOSCOPY;  Service: Endoscopy;  Laterality: Left;      Social History History  Substance Use Topics  . Smoking status: Never Smoker   . Smokeless tobacco: Not on file  . Alcohol Use: No   lives at home alone. Is frequently checked on by her family  Family History Family  History  Problem Relation Age of Onset  . Hyperlipidemia Mother     Prior to Admission medications   Medication Sig Start Date End Date Taking? Authorizing Provider  naproxen sodium (ANAPROX) 220 MG tablet Take 220 mg by mouth 2 (two) times daily as needed (pain).   Yes Historical Provider, MD  diltiazem (CARDIZEM CD) 240 MG 24 hr capsule Take 1 capsule (240 mg total) by mouth daily. 03/17/14   Azalia Bilis, MD  folic acid (FOLVITE) 1 MG tablet Take 1 tablet (1 mg total) by mouth daily. 11/16/13   Marden Noble, MD  glimepiride (AMARYL) 2 MG tablet Take 2 mg by mouth daily.    Historical Provider, MD  haloperidol (HALDOL) 1 MG tablet Take 1 tablet (1 mg total) by mouth 2 (two) times daily. 11/16/13   Marden Noble, MD  hydrochlorothiazide (MICROZIDE) 12.5 MG capsule Take 1 capsule (12.5 mg total) by mouth daily. 03/17/14   Azalia Bilis, MD  LORazepam (ATIVAN) 2 MG/ML injection Inject  0.5 mLs (1 mg total) into the muscle every 6 (six) hours as needed (Agitation). 11/16/13   Marden Noble, MD  losartan (COZAAR) 50 MG tablet Take 1 tablet (50 mg total) by mouth daily. 03/17/14   Azalia Bilis, MD  Memantine HCl ER (NAMENDA XR) 7 MG CP24 Take 7 mg by mouth daily.    Historical Provider, MD  Multiple Vitamin (MULTIVITAMIN WITH MINERALS) TABS tablet Take 1 tablet by mouth daily. 11/16/13   Marden Noble, MD  senna (SENOKOT) 8.6 MG TABS tablet Take 1 tablet (8.6 mg total) by mouth 2 (two) times daily. 11/16/13   Marden Noble, MD    Allergies  Allergen Reactions  . Lyrica [Pregabalin] Other (See Comments)    Lethargy & blurred vision  . Metformin And Related Diarrhea and Other (See Comments)    Elevated pancreatic enzymes  . Toprol Xl [Metoprolol] Other (See Comments)    Asthma  . Benicar [Olmesartan] Other (See Comments)    arthralgias  . Vicodin [Hydrocodone-Acetaminophen] Nausea Only  . Penicillins Other (See Comments)    unknown    Physical Exam  Vitals  Blood pressure 187/95, pulse 78, temperature 97.7 F (36.5 C), temperature source Oral, resp. rate 20, SpO2 99 %.   General: Elderly female lying in bed in NAD.  Not cooperative with exam  Psych:  Awake Alert, Oriented X 3.  Neuro:  Smile is symmetric, she refuses to turn her face to the left, she refuses to grip my hands (because she says they're cold), she is unable to assess sensation. She refuses to follow my finger for examination of ocular movements.  ENT:  Ears and Eyes appear Normal, Conjunctivae clear, PER. Moist oral mucosa without erythema or exudates.  Neck:  Supple, No lymphadenopathy appreciated  Respiratory:  Symmetrical chest wall movement, Good air movement bilaterally, CTAB.  Cardiac:  RRR, No Murmurs, no LE edema noted, no JVD.    Abdomen:  Positive bowel sounds, Soft, Non tender, Non distended,  No masses appreciated  Skin:  No Cyanosis, Normal Skin Turgor, No Skin Rash or  Bruise.  Extremities:  Able to move all 4. 5/5 strength in each,  no effusions.  Data Review  CBC  Recent Labs Lab 03/03/15 1344 03/03/15 1410  WBC 4.3  --   HGB 13.3 15.3*  HCT 41.5 45.0  PLT 184  --   MCV 82.3  --   MCH 26.4  --   MCHC 32.0  --   RDW 14.9  --  Chemistries   Recent Labs Lab 03/03/15 1344 03/03/15 1410  NA 141 141  K 3.6 3.6  CL 105 103  CO2 25  --   GLUCOSE 135* 131*  BUN 44* 47*  CREATININE 2.50* 2.50*  CALCIUM 9.1  --   AST 25  --   ALT 12*  --   ALKPHOS 95  --   BILITOT 0.8  --      Urinalysis    Component Value Date/Time   COLORURINE AMBER* 03/03/2015 1344   APPEARANCEUR CLEAR 03/03/2015 1344   LABSPEC 1.026 03/03/2015 1344   PHURINE 5.0 03/03/2015 1344   GLUCOSEU NEGATIVE 03/03/2015 1344   HGBUR NEGATIVE 03/03/2015 1344   BILIRUBINUR MODERATE* 03/03/2015 1344   KETONESUR 15* 03/03/2015 1344   PROTEINUR 30* 03/03/2015 1344   UROBILINOGEN 1.0 03/03/2015 1344   NITRITE NEGATIVE 03/03/2015 1344   LEUKOCYTESUR NEGATIVE 03/03/2015 1344    Imaging results:   Ct Head Wo Contrast  03/03/2015   CLINICAL DATA:  Right-sided facial droop  EXAM: CT HEAD WITHOUT CONTRAST  TECHNIQUE: Contiguous axial images were obtained from the base of the skull through the vertex without intravenous contrast.  COMPARISON:  11/09/2013  FINDINGS: Global atrophy. Chronic ischemic changes in the periventricular white matter. Lacunar infarcts in the basal ganglia are noted. There is no mass effect, midline shift, or acute hemorrhage.  IMPRESSION: No acute intracranial pathology.   Electronically Signed   By: Jolaine Click M.D.   On: 03/03/2015 15:34   Dg Chest Port 1 View  03/03/2015   CLINICAL DATA:  Patient combative. Altered level of consciousness. Confusion.  EXAM: PORTABLE CHEST - 1 VIEW  COMPARISON:  03/17/2014.  FINDINGS: The film is rotated but best obtainable given the patient's condition. Cardiomediastinal silhouette grossly normal. No infiltrates or  edema. No osseous findings.  IMPRESSION: No active cardiopulmonary disease is evident on this rotated radiograph. No change from priors.   Electronically Signed   By: Elsie Stain M.D.   On: 03/03/2015 14:13    My personal review of EKG: Sinus rhythm, with LVH, prolonged QT.   Assessment & Plan  Principal Problem:   Stroke Active Problems:   DM (diabetes mellitus)   Acute kidney injury   HTN (hypertension)   Dementia  Right-sided weakness Symptoms appear to be resolving.  Sx may be from dehydration Seen by neurology. Will admit to neuro telemetry for stroke workup. MRI/MRA/carotid Dopplers/2-D echo pending Placed on 325 mg aspirin.  Acute kidney injury Baseline creatinine is 1.08 (03/2014).  Creatinine is 2.5 on 03/03/15 Hold ARB, NSAIDs (naproxen) and HCTZ. Give IV fluids.  Diabetes mellitus Hold all medications Place on sliding scale insulin with mealtime coverage.  Hypertension Continue diltiazem. Will hold ARB and HCTZ for now due to acute kidney injury. We will allow for permissive hypertension in case she has had a stroke.  Dementia Continue Namenda. Continue Haldol when necessary at decreased dose. Hold Ativan.   Consultants Called:  Neurology   Family Communication:   Spoke with Dtr Samantha Bond on the phone.  Code Status:  Full.  Condition:  Guarded.  Potential Disposition: TBD based on work up and PT / OT evaluation   Time spent in minutes : [address],  PA-C on 03/03/2015 at 5:22 PM Between 7am to 7pm - Pager - [phone] After 7pm go to [email or web address] - password [email or web address] And look for the night coverage person covering me after hours  [facility]   I have taken an interval  history, reviewed the chart and examined the patient. I agree with the Advanced Practice Provider's note, impression and recommendations. I have made any necessary editorial changes. 81 year old female with a history of dementia, hypertension, diabetes brought to the ED  for lethargy, decreased responsiveness. There is a question of possible stroke neurology was consulted. CT head is negative, will obtain stroke workup. Also patient found to be dehydrated with acute kidney injury , creatinine is  2.5 baseline creatinine 1.08. Will start the patient on IV normal saline at 75 per hour. Follow renal functions in a.m.

## 2015-03-03 NOTE — ED Notes (Signed)
IV team at bedside 

## 2015-03-03 NOTE — ED Notes (Signed)
IV team remains at bedside 

## 2015-03-03 NOTE — ED Provider Notes (Signed)
CSN: [id number]     Arrival date & time 03/03/15  1321 History   First MD Initiated Contact with Patient 03/03/15 1329     Chief Complaint  Patient presents with  . Altered Mental Status     (Consider location/radiation/quality/duration/timing/severity/associated sxs/prior Treatment) The history is provided by the EMS personnel.  Samantha Bond is a 81 y.o. female hx of HTN, DM here with AMS. Patient was at home and last seen normal was around 7 AM. She lives at home with her son. Son went to work around 7 AM and everything was normal and she was sitting on her chair. Daughter went to check up on her around 1 PM and noticed that she was slumped over to the right and was not responding. She also urinated on herself and was covered with urine. EMS noticed right facial droop as well as right-sided gaze preference. No history of strokes in the past per patient is demented but able to walk and communicate at baseline.   Level V caveat- AMS    Past Medical History  Diagnosis Date  . Hypertension   . Diabetes mellitus   . DJD (degenerative joint disease)   . Peripheral neuropathy   . Mild aortic sclerosis   . Spinal stenosis   . Diverticulosis   . DJD (degenerative joint disease)     knees, back & cervical   Past Surgical History  Procedure Laterality Date  . Cholecystectomy    . Esophagogastroduodenoscopy Left 11/15/2013    Procedure: ESOPHAGOGASTRODUODENOSCOPY (EGD);  Surgeon: Willis Modena, MD;  Location: [facility] ENDOSCOPY;  Service: Endoscopy;  Laterality: Left;   Family History  Problem Relation Age of Onset  . Hyperlipidemia Mother    History  Substance Use Topics  . Smoking status: Never Smoker   . Smokeless tobacco: Not on file  . Alcohol Use: No   OB History    No data available     Review of Systems  Unable to perform ROS: Mental status change      Allergies  Lyrica; Metformin and related; Toprol xl; Benicar; Vicodin; and Penicillins  Home Medications    Prior to Admission medications   Medication Sig Start Date End Date Taking? Authorizing Provider  diltiazem (CARDIZEM CD) 240 MG 24 hr capsule Take 1 capsule (240 mg total) by mouth daily. 03/17/14   Azalia Bilis, MD  folic acid (FOLVITE) 1 MG tablet Take 1 tablet (1 mg total) by mouth daily. 11/16/13   Marden Noble, MD  glimepiride (AMARYL) 2 MG tablet Take 2 mg by mouth daily.    Historical Provider, MD  haloperidol (HALDOL) 1 MG tablet Take 1 tablet (1 mg total) by mouth 2 (two) times daily. 11/16/13   Marden Noble, MD  hydrochlorothiazide (MICROZIDE) 12.5 MG capsule Take 1 capsule (12.5 mg total) by mouth daily. 03/17/14   Azalia Bilis, MD  LORazepam (ATIVAN) 2 MG/ML injection Inject 0.5 mLs (1 mg total) into the muscle every 6 (six) hours as needed (Agitation). 11/16/13   Marden Noble, MD  losartan (COZAAR) 50 MG tablet Take 1 tablet (50 mg total) by mouth daily. 03/17/14   Azalia Bilis, MD  Memantine HCl ER (NAMENDA XR) 7 MG CP24 Take 7 mg by mouth daily.    Historical Provider, MD  Multiple Vitamin (MULTIVITAMIN WITH MINERALS) TABS tablet Take 1 tablet by mouth daily. 11/16/13   Marden Noble, MD  senna (SENOKOT) 8.6 MG TABS tablet Take 1 tablet (8.6 mg total) by mouth 2 (two) times daily. 11/16/13  Marden Noble, MD   BP 153/80 mmHg  Pulse 81  Resp 19  SpO2 93% Physical Exam  Constitutional:  Altered, nonverbal   HENT:  Head: Normocephalic.  Eyes: Conjunctivae are normal. Pupils are equal, round, and reactive to light.  Neck: Normal range of motion. Neck supple.  Cardiovascular: Normal rate, regular rhythm and normal heart sounds.   Pulmonary/Chest: Effort normal and breath sounds normal. No respiratory distress. She has no wheezes. She has no rales.  Abdominal: Soft. Bowel sounds are normal. She exhibits no distension. There is no tenderness. There is no rebound.  Musculoskeletal: Normal range of motion.  Neurological:  Altered, slurred speech. Obvious R eye deviation. Lip smacking.  Contracted R side, 4/5 strength R side, 5/5 L side.   Skin: Skin is warm and dry.  Psychiatric:  Unable   Nursing note and vitals reviewed.   ED Course  Procedures (including critical care time) Labs Review Labs Reviewed  COMPREHENSIVE METABOLIC PANEL - Abnormal; Notable for the following:    Glucose, Bld 135 (*)    BUN 44 (*)    Creatinine, Ser 2.50 (*)    ALT 12 (*)    GFR calc non Af Amer 16 (*)    GFR calc Af Amer 19 (*)    All other components within normal limits  URINALYSIS, ROUTINE W REFLEX MICROSCOPIC (NOT AT [facility]) - Abnormal; Notable for the following:    Color, Urine AMBER (*)    Bilirubin Urine MODERATE (*)    Ketones, ur 15 (*)    Protein, ur 30 (*)    All other components within normal limits  URINE MICROSCOPIC-ADD ON - Abnormal; Notable for the following:    Bacteria, UA FEW (*)    Casts GRANULAR CAST (*)    All other components within normal limits  CBG MONITORING, ED - Abnormal; Notable for the following:    Glucose-Capillary 124 (*)    All other components within normal limits  I-STAT CHEM 8, ED - Abnormal; Notable for the following:    BUN 47 (*)    Creatinine, Ser 2.50 (*)    Glucose, Bld 131 (*)    Calcium, Ion 1.12 (*)    Hemoglobin 15.3 (*)    All other components within normal limits  URINE CULTURE  CBC  I-STAT TROPOININ, ED    Imaging Review Dg Chest Port 1 View  03/03/2015   CLINICAL DATA:  Patient combative. Altered level of consciousness. Confusion.  EXAM: PORTABLE CHEST - 1 VIEW  COMPARISON:  03/17/2014.  FINDINGS: The film is rotated but best obtainable given the patient's condition. Cardiomediastinal silhouette grossly normal. No infiltrates or edema. No osseous findings.  IMPRESSION: No active cardiopulmonary disease is evident on this rotated radiograph. No change from priors.   Electronically Signed   By: Elsie Stain M.D.   On: 03/03/2015 14:13     EKG Interpretation   Date/Time:  Monday March 03 2015 13:21:51 EDT Ventricular  Rate:  80 PR Interval:  147 QRS Duration: 99 QT Interval:  448 QTC Calculation: 517 R Axis:   66 Text Interpretation:  Sinus rhythm Biatrial enlargement LVH with secondary  repolarization abnormality Prolonged QT interval prolonged QT new since  previous Confirmed by Asberry Lascola  MD, Zakiyyah Savannah ([id number]) on 03/03/2015 1:41:12 PM      MDM   Final diagnoses:  Confusion    Samantha Bond is a 81 y.o. female here with AMS. Concerned for stroke. Outside of the window for TPA. Discussed with Dr.  Camillo on arrival who agrees with not activating code stroke. Will get CT head and likely admit. Also consider UTI vs pneumonia so will get UA, CXR.   3:06 PM CT showed no bleed. Has acute renal failure Cr 2.5. Neuro recommend inpatient stroke workup. Given IVF for acute renal failure.   Richardean Canal, MD 03/03/15 [id number]

## 2015-03-03 NOTE — Consult Note (Signed)
Referring Physician: Silverio Lay    Chief Complaint: stroke  HPI:                                                                                                                                          All history obtained from chart  Samantha Bond is an 81 y.o. female with no LSN. patient was brought to ED secondary to showing a right facial droop, right gaze preference and leaning to the right. Per chart she lives with her son and was found in both emesis and urine. Patient currently believes she is at home and will not answer any questions.   Date last known well: Unable to determine Time last known well: Unable to determine tPA Given: No: no last seen normal     Past Medical History  Diagnosis Date  . Hypertension   . Diabetes mellitus   . DJD (degenerative joint disease)   . Peripheral neuropathy   . Mild aortic sclerosis   . Spinal stenosis   . Diverticulosis   . DJD (degenerative joint disease)     knees, back & cervical    Past Surgical History  Procedure Laterality Date  . Cholecystectomy    . Esophagogastroduodenoscopy Left 11/15/2013    Procedure: ESOPHAGOGASTRODUODENOSCOPY (EGD);  Surgeon: Willis Modena, MD;  Location: [facility] ENDOSCOPY;  Service: Endoscopy;  Laterality: Left;    Family History  Problem Relation Age of Onset  . Hyperlipidemia Mother    Social History:  reports that she has never smoked. She does not have any smokeless tobacco history on file. She reports that she does not drink alcohol or use illicit drugs. Family history: unable to obtain due to mental status Allergies:  Allergies  Allergen Reactions  . Lyrica [Pregabalin] Other (See Comments)    Lethargy & blurred vision  . Metformin And Related Diarrhea and Other (See Comments)    Elevated pancreatic enzymes  . Toprol Xl [Metoprolol] Other (See Comments)    Asthma  . Benicar [Olmesartan] Other (See Comments)    arthralgias  . Vicodin [Hydrocodone-Acetaminophen] Nausea Only  .  Penicillins Other (See Comments)    unknown    Medications:                                                                                                                          No current facility-administered medications  for this encounter.   Current Outpatient Prescriptions  Medication Sig Dispense Refill  . diltiazem (CARDIZEM CD) 240 MG 24 hr capsule Take 1 capsule (240 mg total) by mouth daily. 30 capsule 0  . folic acid (FOLVITE) 1 MG tablet Take 1 tablet (1 mg total) by mouth daily. 30 tablet 1  . glimepiride (AMARYL) 2 MG tablet Take 2 mg by mouth daily.    . haloperidol (HALDOL) 1 MG tablet Take 1 tablet (1 mg total) by mouth 2 (two) times daily. 60 tablet 1  . hydrochlorothiazide (MICROZIDE) 12.5 MG capsule Take 1 capsule (12.5 mg total) by mouth daily. 30 capsule 1  . LORazepam (ATIVAN) 2 MG/ML injection Inject 0.5 mLs (1 mg total) into the muscle every 6 (six) hours as needed (Agitation). 1 mL 0  . losartan (COZAAR) 50 MG tablet Take 1 tablet (50 mg total) by mouth daily. 30 tablet 0  . Memantine HCl ER (NAMENDA XR) 7 MG CP24 Take 7 mg by mouth daily.    . Multiple Vitamin (MULTIVITAMIN WITH MINERALS) TABS tablet Take 1 tablet by mouth daily. 30 tablet 1  . senna (SENOKOT) 8.6 MG TABS tablet Take 1 tablet (8.6 mg total) by mouth 2 (two) times daily. 120 each 0     ROS:                                                                                                                                       History obtained from unobtainable from patient due to mental status    Physical Examination:                                                                                                      There were no vitals taken for this visit.  HEENT-  Normocephalic, no lesions, without obvious abnormality.  Normal external eye and conjunctiva.  Normal TM's bilaterally.  Normal auditory canals and external ears. Normal external nose, mucus membranes and septum.  Normal  pharynx. Cardiovascular- S1, S2 normal, pulses palpable throughout   Lungs- chest clear, no wheezing, rales, normal symmetric air entry Abdomen- normal findings: liver span normal to percussion Extremities- no edema Lymph-no adenopathy palpable Musculoskeletal-no joint tenderness, deformity or swelling Skin-warm and dry, no hyperpigmentation, vitiligo, or suspicious lesions  Neurological Examination Mental Status: Alert, not oriented,  Speech fluent and repeats what I say to her. Follows no verbal or visual commands. Cranial Nerves: II: Discs flat bilaterally; blinks to threat on the right but  not the left, pupils equal, round, reactive to light and accommodation III,IV, VI: ptosis present on the left eye, eyes do not cross midline to the left V,VII: smile asymmetric on the left, unable to assess sensation  Motor: Moving all extremities antigravity with good strength in response to pain.  Sensory: withdraws to pain in all extremities.  Deep Tendon Reflexes: 2+ and symmetric throughout Plantars: Right: downgoing   Left: downgoing Cerebellar: Unable to assess Gait: unable to assess       Lab Results: Basic Metabolic Panel: No results for input(s): NA, K, CL, CO2, GLUCOSE, BUN, CREATININE, CALCIUM, MG, PHOS in the last 168 hours.  Liver Function Tests: No results for input(s): AST, ALT, ALKPHOS, BILITOT, PROT, ALBUMIN in the last 168 hours. No results for input(s): LIPASE, AMYLASE in the last 168 hours. No results for input(s): AMMONIA in the last 168 hours.  CBC: No results for input(s): WBC, NEUTROABS, HGB, HCT, MCV, PLT in the last 168 hours.  Cardiac Enzymes: No results for input(s): CKTOTAL, CKMB, CKMBINDEX, TROPONINI in the last 168 hours.  Lipid Panel: No results for input(s): CHOL, TRIG, HDL, CHOLHDL, VLDL, LDLCALC in the last 168 hours.  CBG:  Recent Labs Lab 03/03/15 1324  GLUCAP 124*    Microbiology: Results for orders placed or performed during the  hospital encounter of 01/12/12  Urine culture     Status: None   Collection Time: 01/12/12 10:18 AM  Result Value Ref Range Status   Specimen Description URINE, RANDOM  Final   Special Requests NONE  Final   Culture  Setup Time [id number]  Final   Colony Count 20,OOO COLONIES/ML  Final   Culture   Final    Multiple bacterial morphotypes present, none predominant. Suggest appropriate recollection if clinically indicated.   Report Status 01/13/2012 FINAL  Final  MRSA PCR Screening     Status: None   Collection Time: 01/12/12  2:37 PM  Result Value Ref Range Status   MRSA by PCR NEGATIVE NEGATIVE Final    Comment:        The GeneXpert MRSA Assay (FDA approved for NASAL specimens only), is one component of a comprehensive MRSA colonization surveillance program. It is not intended to diagnose MRSA infection nor to guide or monitor treatment for MRSA infections.    Coagulation Studies: No results for input(s): LABPROT, INR in the last 72 hours.  Imaging: No results found.     Assessment and plan discussed with with attending physician and they are in agreement.    Felicie Morn PA-C [facility] [phone]  03/03/2015, 2:01 PM   Assessment: 81 y.o. female with new onset of left facial droop, left field cut, right gaze preference. No LSN and not a tPA candidate. Likely right brain CVA.   Stroke Risk Factors - diabetes mellitus and hypertension  Recommend: 1. HgbA1c, fasting lipid panel 2. MRI, MRA  of the brain without contrast 3. PT consult, OT consult, Speech consult 4. Echocardiogram 5. Carotid dopplers 6. Prophylactic therapy-Antiplatelet med: Aspirin - dose 325 mg 7. Risk factor modification 8. Telemetry monitoring 9. Frequent neuro checks 10 NPO until passes stroke swallow screen   Patient seen and examined together with physician assistant and I concur with the assessment and plan.  Wyatt Portela, MD

## 2015-03-04 ENCOUNTER — Inpatient Hospital Stay (HOSPITAL_COMMUNITY): Payer: Medicare Other

## 2015-03-04 DIAGNOSIS — R410 Disorientation, unspecified: Secondary | ICD-10-CM | POA: Insufficient documentation

## 2015-03-04 LAB — BASIC METABOLIC PANEL
ANION GAP: 11 (ref 5–15)
BUN: 42 mg/dL — ABNORMAL HIGH (ref 6–20)
CALCIUM: 8.8 mg/dL — AB (ref 8.9–10.3)
CO2: 23 mmol/L (ref 22–32)
CREATININE: 2.14 mg/dL — AB (ref 0.44–1.00)
Chloride: 107 mmol/L (ref 101–111)
GFR calc non Af Amer: 20 mL/min — ABNORMAL LOW (ref >60)
GFR, EST AFRICAN AMERICAN: 23 mL/min — AB (ref >60)
GLUCOSE: 132 mg/dL — AB (ref 65–99)
Potassium: 3.6 mmol/L (ref 3.5–5.1)
Sodium: 141 mmol/L (ref 135–145)

## 2015-03-04 LAB — GLUCOSE, CAPILLARY
GLUCOSE-CAPILLARY: 111 mg/dL — AB (ref 65–99)
GLUCOSE-CAPILLARY: 165 mg/dL — AB (ref 65–99)
GLUCOSE-CAPILLARY: 188 mg/dL — AB (ref 65–99)
Glucose-Capillary: 120 mg/dL — ABNORMAL HIGH (ref 65–99)
Glucose-Capillary: 109 mg/dL — ABNORMAL HIGH (ref 65–99)
Glucose-Capillary: 86 mg/dL (ref 65–99)

## 2015-03-04 LAB — LIPID PANEL
CHOLESTEROL: 263 mg/dL — AB (ref 0–200)
HDL: 64 mg/dL (ref >40)
LDL Cholesterol: 178 mg/dL — ABNORMAL HIGH (ref 0–99)
Total CHOL/HDL Ratio: 4.1 RATIO
Triglycerides: 104 mg/dL (ref <150)
VLDL: 21 mg/dL (ref 0–40)

## 2015-03-04 LAB — MAGNESIUM: Magnesium: 2.3 mg/dL (ref 1.7–2.4)

## 2015-03-04 LAB — HEMOGLOBIN A1C
HEMOGLOBIN A1C: 6.6 % — AB (ref 4.8–5.6)
MEAN PLASMA GLUCOSE: 143 mg/dL

## 2015-03-04 MED ORDER — ATORVASTATIN CALCIUM 40 MG PO TABS
40.0000 mg | ORAL_TABLET | Freq: Every day | ORAL | Status: DC
  Administered 2015-03-04 – 2015-03-06 (×3): 40 mg via ORAL
  Filled 2015-03-04 (×3): qty 1

## 2015-03-04 NOTE — Progress Notes (Signed)
PROGRESS NOTE  Samantha Bond ZOX:[id number] DOB: [birth year] DOA: 03/03/2015 PCP: Pearla Dubonnet, MD  HPI/Recap of past 24 hours:  Calm and cooperative this am, working with speech therapist. Conversant and following simple command at this time, no family member in room bp elevated, will allow permissive hypertension today  Assessment/Plan: Principal Problem:   Stroke Active Problems:   DM (diabetes mellitus)   Acute kidney injury   HTN (hypertension)   Dementia  Right-sided weakness Work for cva in progress MRI/MRA/carotid Dopplers/2-D echo pending on 325 mg aspirin/lipitor Permissive HTN for now Appreciate neurology input  Acute kidney injury Baseline creatinine is 1.08 (03/2014). Creatinine is 2.5 on 03/03/15 Hold ARB, NSAIDs (naproxen) and HCTZ. Give IV fluids. Cr slightly improved, UA no infection  Diabetes mellitus Hold all oral medications Place on sliding scale insulin with mealtime coverage.  Hypertension Continue diltiazem.  Will hold ARB and HCTZ for now due to acute kidney injury. We will allow for permissive hypertension  Dementia Continue Namenda. Continue Haldol when necessary at decreased dose. Hold Ativan.  Code Status: full  Family Communication: no family in the room  Disposition Plan: likely SNF   Consultants:  neurology  Procedures:  none  Antibiotics:  none   Objective: BP 172/87 mmHg  Pulse 89  Temp(Src) 97.7 F (36.5 C) (Axillary)  Resp 20  SpO2 97% No intake or output data in the 24 hours ending 03/04/15 0842 There were no vitals filed for this visit.  Exam:   General:  NAD, demented elderly, following command intermittently  Cardiovascular: RRR  Respiratory: CTABL  Abdomen: Soft/ND/NT, positive BS  Musculoskeletal: No Edema  Neuro: demented, intermittent agitation, right sided weakness, not cooperative with physical exam consistently  Data Reviewed: Basic Metabolic Panel:  Recent Labs Lab  03/03/15 1344 03/03/15 1410  NA 141 141  K 3.6 3.6  CL 105 103  CO2 25  --   GLUCOSE 135* 131*  BUN 44* 47*  CREATININE 2.50* 2.50*  CALCIUM 9.1  --    Liver Function Tests:  Recent Labs Lab 03/03/15 1344  AST 25  ALT 12*  ALKPHOS 95  BILITOT 0.8  PROT 7.0  ALBUMIN 3.6   No results for input(s): LIPASE, AMYLASE in the last 168 hours. No results for input(s): AMMONIA in the last 168 hours. CBC:  Recent Labs Lab 03/03/15 1344 03/03/15 1410  WBC 4.3  --   HGB 13.3 15.3*  HCT 41.5 45.0  MCV 82.3  --   PLT 184  --    Cardiac Enzymes:   No results for input(s): CKTOTAL, CKMB, CKMBINDEX, TROPONINI in the last 168 hours. BNP (last 3 results) No results for input(s): BNP in the last 8760 hours.  ProBNP (last 3 results) No results for input(s): PROBNP in the last 8760 hours.  CBG:  Recent Labs Lab 03/03/15 1735 03/03/15 2057 03/04/15 0122 03/04/15 0357 03/04/15 0818  GLUCAP 139* 127* 120* 109* 111*    No results found for this or any previous visit (from the past 240 hour(s)).   Studies: Ct Head Wo Contrast  03/03/2015   CLINICAL DATA:  Right-sided facial droop  EXAM: CT HEAD WITHOUT CONTRAST  TECHNIQUE: Contiguous axial images were obtained from the base of the skull through the vertex without intravenous contrast.  COMPARISON:  11/09/2013  FINDINGS: Global atrophy. Chronic ischemic changes in the periventricular white matter. Lacunar infarcts in the basal ganglia are noted. There is no mass effect, midline shift, or acute hemorrhage.  IMPRESSION: No acute  intracranial pathology.   Electronically Signed   By: Jolaine Click M.D.   On: 03/03/2015 15:34   Dg Chest Port 1 View  03/03/2015   CLINICAL DATA:  Patient combative. Altered level of consciousness. Confusion.  EXAM: PORTABLE CHEST - 1 VIEW  COMPARISON:  03/17/2014.  FINDINGS: The film is rotated but best obtainable given the patient's condition. Cardiomediastinal silhouette grossly normal. No infiltrates or  edema. No osseous findings.  IMPRESSION: No active cardiopulmonary disease is evident on this rotated radiograph. No change from priors.   Electronically Signed   By: Elsie Stain M.D.   On: 03/03/2015 14:13    Scheduled Meds: . aspirin  300 mg Rectal Daily   Or  . aspirin  325 mg Oral Daily  . diltiazem  240 mg Oral Daily  . heparin  5,000 Units Subcutaneous 3 times per day  . insulin aspart  0-9 Units Subcutaneous 6 times per day  . senna  1 tablet Oral BID    Continuous Infusions: . sodium chloride 75 mL/hr at 03/04/15 0840     Time spent:  Reid Regas MD, PhD  [facility] Pager [phone]. If 7PM-7AM, please contact night-coverage at [email or web address], password [facility] 03/04/2015, 8:42 AM  LOS: 1 day

## 2015-03-04 NOTE — Progress Notes (Signed)
STROKE TEAM PROGRESS NOTE   HISTORY  All history obtained from chart Samantha Bond is an 81 y.o. female with no LSN. patient was brought to ED secondary to showing a right facial droop, right gaze preference and leaning to the right. Per chart she lives with her son and was found in both emesis and urine. Patient currently believes she is at home and will not answer any questions.   Date last known well: Unable to determine Time last known well: Unable to determine tPA Given: No: no last seen normal   SUBJECTIVE (INTERVAL HISTORY) No family members present. The patient remains confused. She has a history of dementia. She follows some simple commands. She appears to have a left visual field cut. She required Haldol during the night for agitation.   OBJECTIVE Temp:  [97.7 F (36.5 C)-98.4 F (36.9 C)] 97.9 F (36.6 C) (07/26 0933) Pulse Rate:  [78-89] 82 (07/26 0933) Cardiac Rhythm:  [-] Normal sinus rhythm (07/25 2100) Resp:  [18-25] 20 (07/26 0933) BP: (143-192)/(71-96) 179/94 mmHg (07/26 0933) SpO2:  [93 %-100 %] 100 % (07/26 0933)   Recent Labs Lab 03/03/15 2057 03/04/15 0122 03/04/15 0357 03/04/15 0818 03/04/15 1154  GLUCAP 127* 120* 109* 111* 165*    Recent Labs Lab 03/03/15 1344 03/03/15 1410 03/04/15 0620  NA 141 141 141  K 3.6 3.6 3.6  CL 105 103 107  CO2 25  --  23  GLUCOSE 135* 131* 132*  BUN 44* 47* 42*  CREATININE 2.50* 2.50* 2.14*  CALCIUM 9.1  --  8.8*  MG  --   --  2.3    Recent Labs Lab 03/03/15 1344  AST 25  ALT 12*  ALKPHOS 95  BILITOT 0.8  PROT 7.0  ALBUMIN 3.6    Recent Labs Lab 03/03/15 1344 03/03/15 1410  WBC 4.3  --   HGB 13.3 15.3*  HCT 41.5 45.0  MCV 82.3  --   PLT 184  --    No results for input(s): CKTOTAL, CKMB, CKMBINDEX, TROPONINI in the last 168 hours. No results for input(s): LABPROT, INR in the last 72 hours.  Recent Labs  03/03/15 1344  COLORURINE AMBER*  LABSPEC 1.026  PHURINE 5.0  GLUCOSEU  NEGATIVE  HGBUR NEGATIVE  BILIRUBINUR MODERATE*  KETONESUR 15*  PROTEINUR 30*  UROBILINOGEN 1.0  NITRITE NEGATIVE  LEUKOCYTESUR NEGATIVE       Component Value Date/Time   CHOL 263* 03/04/2015 0620   TRIG 104 03/04/2015 0620   HDL 64 03/04/2015 0620   CHOLHDL 4.1 03/04/2015 0620   VLDL 21 03/04/2015 0620   LDLCALC 178* 03/04/2015 0620   Lab Results  Component Value Date   HGBA1C 6.6* 03/03/2015   No results found for: LABOPIA, COCAINSCRNUR, LABBENZ, AMPHETMU, THCU, LABBARB  No results for input(s): ETH in the last 168 hours.   Imaging   Ct Head Wo Contrast 03/03/2015    No acute intracranial pathology.      Dg Chest Port 1 View 03/03/2015    No active cardiopulmonary disease is evident on this rotated radiograph. No change from priors.         PHYSICAL EXAM Elderly lady not in distress. . Afebrile. Head is nontraumatic. Neck is supple without bruit.    Cardiac exam no murmur or gallop. Lungs are clear to auscultation. Distal pulses are well felt. Neurological Exam :  Awake alert oriented 2. Diminished attention, registration and recall. Follows midline and one-step and a few two-step commands. No aphasia  or dysarthria or apraxia. Extraocular movements are full range without nystagmus. Dense left commonhemianopsia. Minimum left lower facial asymmetry. Tongue midline. Motor system exam no upper or lower extremity drift. Diminished fine finger movements on the left. Orbits right over left upper extremity. Symmetric lower extremity strength. Sensation appears preserved bilaterally. Coordination is slow but accurate. Gait was not tested.  ASSESSMENT/PLAN Samantha Bond is a 81 y.o. female with history of hypertension, dementia, diabetes mellitus, peripheral neuropathy, and spinal stenosis presenting with right facial droop and right hemiparesis.  She did not receive IV t-PA due to unknown time of onset.  Stroke / TIA:  Dominant secondary to small vessel  disease.  Resultant  right hemiparesis  MRI  pending  MRA  pending  Carotid Doppler  pending  2D Echo pending  LDL 178  HgbA1c 6.6  Subcutaneous heparin for VTE prophylaxis DIET DYS 3 Room service appropriate?: Yes; Fluid consistency:: Thin  no antithrombotic prior to admission, now on aspirin 300 mg suppository daily  Patient counseled to be compliant with her antithrombotic medications  Ongoing aggressive stroke risk factor management  Therapy recommendations: Skilled nursing facility recommended.  Disposition: Pending  Hypertension  Home meds: Diltiazem, hydrochlorothiazide, and losartan.  Blood pressure running high  Permissive hypertension <220/120 for 24-48 hours and then gradually normalize within 5-7 days  Patient counseled to be compliant with her blood pressure medications  Hyperlipidemia  Home meds: No lipid lowering medications prior to admission  LDL 178, goal < 70  Add Lipitor 40 mg daily  Continue statin at discharge  Diabetes  HgbA1c 6.6, goal < 7.0  Controlled  Other Stroke Risk Factors  Advanced age  Obesity    Other Active Problems  BUN 47 creatinine 2.5 - baseline creatinine 1.08 - ARB, NSAID, and HCTZ held.    Other Pertinent History  Dementia  PLAN  If patient does not tolerate MRI may need to consider repeat head CT 24 hours after the initial study.    Hospital day # 1  Delton See PA-C [facility] Pager [phone] 03/04/2015, 1:07 PM  I have personally examined this patient, reviewed notes, independently viewed imaging studies, participated in medical decision making and plan of care. I have made any additions or clarifications directly to the above note. Agree with note above. She presented with left hemiparesis and left-sided vision loss likely due to right hemispheric infarct etiology to be determined. She remains at risk for neurological worsening, recurrent stroke, TIA needs ongoing  stroke evaluation and aggressive risk factor control.  Delia Heady, MD Medical Director [facility] Pager: [phone] 03/04/2015 1:29 PM    To contact Stroke Continuity provider, please refer to [email or web address]. After hours, contact General Neurology

## 2015-03-04 NOTE — Evaluation (Signed)
Occupational Therapy Evaluation Patient Details Name: Samantha Bond MRN: [id number] DOB: 05-09-1927 Today's Date: 03/04/2015    History of Present Illness 81 y.o. female with dementia, HTN, DM, and peripheral neruopathy. Brought to ED with R facial droop and R side weakness. Head CT is negative.   Clinical Impression   Pt with baseline dementia. Per chart, Pt lived alone with son staying at night and daughter bringing lunch. Pt confused, reports she is at home. Pt required max assist for bed mobility and transfer to chair. Unable to assess vision due to cognition; pt appears to have L visual inattention. Recommending SNF due to decreased caregiver support and inability to safely complete ADLs independently.     Follow Up Recommendations  SNF;Supervision/Assistance - 24 hour    Equipment Recommendations  Other (comment) (TBD next venue)    Recommendations for Other Services       Precautions / Restrictions Precautions Precautions: Fall Restrictions Weight Bearing Restrictions: No      Mobility Bed Mobility Overal bed mobility: Needs Assistance;+2 for physical assistance Bed Mobility: Rolling;Sidelying to Sit Rolling: Max assist Sidelying to sit: Max assist;+2 for physical assistance;HOB elevated          Transfers Overall transfer level: Needs assistance Equipment used: None Transfers: Stand Pivot Transfers   Stand pivot transfers: Max assist;+2 physical assistance;From elevated surface       General transfer comment: transfer to R due to L inattention    Balance Overall balance assessment: Needs assistance Sitting-balance support: Feet supported;Bilateral upper extremity supported Sitting balance-Leahy Scale: Fair     Standing balance support: Bilateral upper extremity supported                                ADL Overall ADL's : Needs assistance/impaired Eating/Feeding: Set up;Sitting   Grooming: Wash/dry hands;Wash/dry face;Set  up;Sitting;Bed level   Upper Body Bathing: Maximal assistance;Sitting   Lower Body Bathing: Maximal assistance;Sitting/lateral leans   Upper Body Dressing : Maximal assistance;Sitting   Lower Body Dressing: Total assistance;Sit to/from stand   Toilet Transfer: Maximal assistance;BSC;+2 for physical assistance;Stand-pivot   Toileting- Clothing Manipulation and Hygiene: Maximal assistance       Functional mobility during ADLs: Maximal assistance;+2 for physical assistance General ADL Comments: Pt requires assist with ADLs due to cognition and weakness. Pt with incontinence and lacks awareness of being wet.      Vision Vision Assessment?: Vision impaired- to be further tested in functional context Additional Comments: inattention to items on L   Perception     Praxis      Pertinent Vitals/Pain Pain Assessment: No/denies pain     Hand Dominance     Extremity/Trunk Assessment Upper Extremity Assessment Upper Extremity Assessment: Difficult to assess due to impaired cognition;Generalized weakness   Lower Extremity Assessment Lower Extremity Assessment: Difficult to assess due to impaired cognition;Defer to PT evaluation       Communication Communication Communication: HOH   Cognition Arousal/Alertness: Lethargic Behavior During Therapy: Flat affect Overall Cognitive Status: No family/caregiver present to determine baseline cognitive functioning                     General Comments       Exercises       Shoulder Instructions      Home Living       Type of Home: House  Additional Comments: Per chart, Pt lives at home; son stays the night and daughter brings lunch.  Lives With: Family    Prior Functioning/Environment          Comments: unknown due to cognition and no family present to determine    OT Diagnosis: Disturbance of vision;Generalized weakness;Cognitive deficits   OT Problem List: Decreased  strength;Decreased range of motion;Decreased activity tolerance;Impaired balance (sitting and/or standing);Impaired vision/perception;Decreased cognition;Decreased safety awareness;Decreased knowledge of use of DME or AE;Obesity   OT Treatment/Interventions: Self-care/ADL training;Therapeutic exercise;Therapeutic activities;Visual/perceptual remediation/compensation;Patient/family education;Balance training    OT Goals(Current goals can be found in the care plan section) Acute Rehab OT Goals Patient Stated Goal: none stated OT Goal Formulation: Patient unable to participate in goal setting ADL Goals Pt Will Perform Grooming: sitting;with min assist Pt Will Perform Upper Body Dressing: with min assist;sitting Pt Will Transfer to Toilet: with mod assist;stand pivot transfer;bedside commode  OT Frequency: Min 2X/week   Barriers to D/C: Decreased caregiver support  Per chart, Pt lives alone but son spends the night and daughter brings lunch. No family present during OT session.       Co-evaluation              End of Session Equipment Utilized During Treatment: Gait belt Nurse Communication: Mobility status  Activity Tolerance: Patient limited by lethargy Patient left: in chair;with call bell/phone within reach;with chair alarm set   Time: 1610-9604 OT Time Calculation (min): 28 min Charges:  OT General Charges $OT Visit: 1 Procedure OT Evaluation $Initial OT Evaluation Tier I: 1 Procedure OT Treatments $Self Care/Home Management : 8-22 mins G-Codes:    Marden Noble 03/04/2015, 11:57 AM

## 2015-03-04 NOTE — Evaluation (Signed)
Physical Therapy Evaluation Patient Details Name: Samantha Bond MRN: [id number] DOB: 1927/03/13 Today's Date: 03/04/2015   History of Present Illness  81 y.o. female with dementia, HTN, DM, and peripheral neruopathy. Brought to ED with R facial droop and R side weakness. Head CT is negative.  MRI - no acute infarct  Clinical Impression  Patient presents with problems listed below.  Will benefit from acute PT to maximize mobility prior to discharge.  Patient did not verbalize during PT session this pm.  Became agitated with attempts to stand/continue therapy.  This limited PT evaluation today.  Patient lives alone per medical record.  Recommend SNF at discharge for continued therapy and 24 hour assist.    Follow Up Recommendations SNF;Supervision/Assistance - 24 hour    Equipment Recommendations  Other (comment) (TBD )    Recommendations for Other Services       Precautions / Restrictions Precautions Precautions: Fall Precaution Comments: agitated at times Restrictions Weight Bearing Restrictions: No      Mobility  Bed Mobility Overal bed mobility: Needs Assistance;+2 for physical assistance Bed Mobility: Rolling;Sidelying to Sit;Sit to Supine Rolling: Max assist Sidelying to sit: Max assist;+2 for physical assistance   Sit to supine: Total assist;+2 for physical assistance   General bed mobility comments: Verbal and tactile cues to move to sitting.  Patient did not initiate movement.  Required +2 max assist to move to sitting.  Once initiated, patient did attempt to help with LUE to push to sitting.  Once upright, patient able to maintain sitting balance with min guard assist.  Attempted to have patient move UE/LE's in sitting - did not follow commands.  Total assist to return to supine due to agitation.  Transfers Overall transfer level: Needs assistance Equipment used: 2 person hand held assist Transfers: Sit to/from Stand Sit to Stand: Max assist;+2 physical  assistance         General transfer comment: Verbal and tactile cues to move to standing.  Attempted to assist patient.  Patient pushing back into extension.  Patient began hitting at therapist.  Ended session due to patient's agitation increasing.  Returned to supine.  Ambulation/Gait                Stairs            Wheelchair Mobility    Modified Rankin (Stroke Patients Only) Modified Rankin (Stroke Patients Only) Pre-Morbid Rankin Score: Slight disability Modified Rankin: Severe disability     Balance   Sitting-balance support: Feet supported Sitting balance-Leahy Scale: Fair                                       Pertinent Vitals/Pain Pain Assessment: No/denies pain    Home Living Family/patient expects to be discharged to:: Private residence Living Arrangements: Alone Available Help at Discharge: Family;Available PRN/intermittently (Per chart, son stays at night, daughter at lunchtime) Type of Home: House           Additional Comments: Patient unable to provide information.  Taken from chart.    Prior Function           Comments: Unknown. Patient unable to provide information due to cognition.  No family present.     Hand Dominance        Extremity/Trunk Assessment   Upper Extremity Assessment: Difficult to assess due to impaired cognition (Moves against gravity)  Lower Extremity Assessment: Difficult to assess due to impaired cognition (Moves BLE's against gravity)         Communication   Communication: HOH (Did not verbalize during PT session)  Cognition Arousal/Alertness: Lethargic Behavior During Therapy: Agitated;Flat affect (Patient hitting at therapist) Overall Cognitive Status: No family/caregiver present to determine baseline cognitive functioning                      General Comments      Exercises        Assessment/Plan    PT Assessment Patient needs continued PT  services  PT Diagnosis Difficulty walking;Generalized weakness;Altered mental status   PT Problem List Decreased strength;Decreased activity tolerance;Decreased mobility;Decreased cognition  PT Treatment Interventions DME instruction;Gait training;Functional mobility training;Therapeutic activities;Balance training;Cognitive remediation;Patient/family education   PT Goals (Current goals can be found in the Care Plan section) Acute Rehab PT Goals Patient Stated Goal: Patient unable to state PT Goal Formulation: Patient unable to participate in goal setting Time For Goal Achievement: 03/18/15 Potential to Achieve Goals: Fair    Frequency Min 2X/week   Barriers to discharge Decreased caregiver support Patient lives alone    Co-evaluation               End of Session Equipment Utilized During Treatment: Gait belt Activity Tolerance: Treatment limited secondary to agitation Patient left: in bed;with call bell/phone within reach;with bed alarm set Nurse Communication: Mobility status (Agitation)         Time: 1610-9604 PT Time Calculation (min) (ACUTE ONLY): 16 min   Charges:   PT Evaluation $Initial PT Evaluation Tier I: 1 Procedure     PT G CodesVena Austria 03/24/15, 6:09 PM Durenda Hurt. Renaldo Fiddler, [facility] Acute Rehab Services Pager [phone]

## 2015-03-04 NOTE — Progress Notes (Signed)
Pt initially calm and appropriate upon initiating shift assessment however any type of physical contact contact results in Pt become very agitated and combative. Pt was scheduled for MRI, transporter arrived to take Pt, however Pt was agitated and transport was postponed until Pt was more relaxed, On call was contacted, orders received for  Ativan IV which was coordinated with MRI and transport to take Pt during appropriate time for medication to be effective. MRI contacted me approx 30 minutes later and states that pt was continuing to be uncooperative and agitated. On call contacted again, again orders received for additional medication 2.5mg  Haldol IV, which was administered by me at MRI at 2340. Pt was left with Radiology for medication to be effective and MRI attempted once again. Pt was returned to unit 0100 with message that MRI was not completed, Pt was somewhat more sedated however did remain un cooperative with assessment and Hygiene activities.

## 2015-03-04 NOTE — Evaluation (Signed)
Speech Language Pathology Evaluation Patient Details Name: Samantha Bond MRN: [id number] DOB: Dec 21, 1926 Today's Date: 03/04/2015 Time: 4098-1191 SLP Time Calculation (min) (ACUTE ONLY): 25 min  Problem List:  Patient Active Problem List   Diagnosis Date Noted  . Stroke 03/03/2015  . Acute kidney injury 03/03/2015  . HTN (hypertension) 03/03/2015  . Dementia 03/03/2015  . Altered mental status 11/09/2013  . Accelerated hypertension 11/07/2013  . Hypertensive encephalopathy 01/14/2012  . Malignant hypertensive urgency 01/12/2012  . DM (diabetes mellitus) 01/12/2012  . Impaired cognition 01/12/2012  . Syncope 01/12/2012   Past Medical History:  Past Medical History  Diagnosis Date  . Hypertension   . Diabetes mellitus   . DJD (degenerative joint disease)   . Peripheral neuropathy   . Mild aortic sclerosis   . Spinal stenosis   . Diverticulosis   . DJD (degenerative joint disease)     knees, back & cervical   Past Surgical History:  Past Surgical History  Procedure Laterality Date  . Cholecystectomy    . Esophagogastroduodenoscopy Left 11/15/2013    Procedure: ESOPHAGOGASTRODUODENOSCOPY (EGD);  Surgeon: Willis Modena, MD;  Location: [facility] ENDOSCOPY;  Service: Endoscopy;  Laterality: Left;   HPI:  Pt is an 81 y.o. female with PMH of dementia, hypertention, DM with peripheral neuropathy brought to ED on 7/25 with presumed stroke- decreased responsiveness, unable to walk, facial droop with R side weakness. Failed RN stroke swallow screen (did not cough on command). CXR showed no abnormalities, head CT clear, uncooperative for MRI. Bedside swallow eval and speech/ language eval ordered as part of stroke workup.   Assessment / Plan / Recommendation Clinical Impression  Pt currently demonstrating moderate cognitive deficits characterized by difficulties with selective attention, short term memory, orientation, and basic problem solving with some perseveration noted during  confrontation naming tasks. Across tasks pt was slow to respond. Motor speech and auditory comprehension appear within normal limits- pt able to follow multistep directions. Pt has dementia and it is unclear whether current deficits are at baseline. Will continue to follow to address cognition to increase safety in the acute care setting and determine whether pt is at baseline level of functioning.    SLP Assessment  Patient needs continued Speech Lanaguage Pathology Services    Follow Up Recommendations  24 hour supervision/assistance;Other (comment) (further recommendations pending)    Frequency and Duration min 1 x/week  1 week   Pertinent Vitals/Pain Pain Assessment: No/denies pain   SLP Goals  Potential to Achieve Goals (ACUTE ONLY): Fair Potential Considerations (ACUTE ONLY): Previous level of function  SLP Evaluation Prior Functioning  Cognitive/Linguistic Baseline: Baseline deficits Baseline deficit details: dementia- not sure of severity Type of Home: House  Lives With: Family   Cognition  Overall Cognitive Status: No family/caregiver present to determine baseline cognitive functioning Arousal/Alertness: Awake/alert Orientation Level: Oriented to person;Oriented to place;Disoriented to time;Disoriented to situation Attention: Sustained;Selective Sustained Attention: Appears intact Selective Attention: Impaired Selective Attention Impairment: Verbal basic Memory: Impaired Memory Impairment: Decreased recall of new information;Decreased short term memory Decreased Short Term Memory: Verbal basic Awareness: Impaired Awareness Impairment: Intellectual impairment Problem Solving: Impaired Problem Solving Impairment: Functional basic    Comprehension  Auditory Comprehension Overall Auditory Comprehension: Appears within functional limits for tasks assessed Yes/No Questions: Within Functional Limits Commands: Within Functional Limits Conversation: Simple Interfering  Components: Processing speed EffectiveTechniques: Extra processing time;Repetition    Expression Expression Primary Mode of Expression: Verbal Verbal Expression Overall Verbal Expression: Impaired Initiation: No impairment Level of Generative/Spontaneous Verbalization: Sentence  Repetition: Impaired Level of Impairment: Sentence level Naming: Impairment Responsive: 76-100% accurate Confrontation: Impaired Verbal Errors: Other (comment);Perseveration (one instance of perseveration) Pragmatics: No impairment Non-Verbal Means of Communication: Not applicable   Oral / Motor Oral Motor/Sensory Function Overall Oral Motor/Sensory Function: Appears within functional limits for tasks assessed Labial ROM: Within Functional Limits Labial Symmetry: Within Functional Limits Labial Strength: Within Functional Limits Labial Sensation: Within Functional Limits Lingual ROM: Within Functional Limits Lingual Symmetry: Within Functional Limits Lingual Strength: Within Functional Limits Motor Speech Overall Motor Speech: Appears within functional limits for tasks assessed   GO     Metro Kung, MA, CCC-SLP 03/04/2015, 9:17 AM

## 2015-03-04 NOTE — Progress Notes (Signed)
Attempted Carotid Duplex. Test could not be performed due to patient screaming and becoming combative.   Farrel Demark, RDMS, RVT 03/04/2015 2:47 PM

## 2015-03-04 NOTE — Evaluation (Signed)
Clinical/Bedside Swallow Evaluation Patient Details  Name: Samantha Bond MRN: [id number] Date of Birth: January 20, 1927  Today's Date: 03/04/2015 Time: SLP Start Time (ACUTE ONLY): 0830 SLP Stop Time (ACUTE ONLY): 0855 SLP Time Calculation (min) (ACUTE ONLY): 25 min  Past Medical History:  Past Medical History  Diagnosis Date  . Hypertension   . Diabetes mellitus   . DJD (degenerative joint disease)   . Peripheral neuropathy   . Mild aortic sclerosis   . Spinal stenosis   . Diverticulosis   . DJD (degenerative joint disease)     knees, back & cervical   Past Surgical History:  Past Surgical History  Procedure Laterality Date  . Cholecystectomy    . Esophagogastroduodenoscopy Left 11/15/2013    Procedure: ESOPHAGOGASTRODUODENOSCOPY (EGD);  Surgeon: Willis Modena, MD;  Location: [facility] ENDOSCOPY;  Service: Endoscopy;  Laterality: Left;   HPI:  Pt is an 81 y.o. female with PMH of dementia, hypertention, DM with peripheral neuropathy brought to ED on 7/25 with presumed stroke- decreased responsiveness, unable to walk, facial droop with R side weakness. Failed RN stroke swallow screen (did not cough on command). CXR showed no abnormalities, head CT clear, uncooperative for MRI. Bedside swallow eval and speech/ language eval ordered as part of stroke workup.   Assessment / Plan / Recommendation Clinical Impression  Pt demonstrated no immediate s/s of aspiration; however, did demonstrate a delayed throat clear x1 following trial of regular solid, along with frequent belching- more indicative of possible esophageal dysmotility. No difficulties noted with thin liquids; swallow appeared timely with adequate hyolaryngeal excursion. Pt was able to follow most functional 1-step commands but did fall asleep on 2 occasions during evaluation. CXR was clear. Given cognitive status and presence of delayed throat clear following trial of solid, aspiration risk appears mild-moderate at this time. Recommend  initiating dysphagia 3/ thin liquid diet, meds whole with liquid or crushed in puree if difficulties arise. Provide intermittent supervision during meals- pt may need some assistance/ encouragement with feeding. Will f/u at least x1 for diet tolerance.    Aspiration Risk  Mild ((mild-moderate))    Diet Recommendation Dysphagia 3 (Mech soft);Thin   Medication Administration: Whole meds with liquid Compensations: Slow rate;Small sips/bites    Other  Recommendations Oral Care Recommendations: Oral care BID   Follow Up Recommendations       Frequency and Duration min 1 x/week  1 week   Pertinent Vitals/Pain none    SLP Swallow Goals     Swallow Study Prior Functional Status       General Other Pertinent Information: Pt is an 81 y.o. female with PMH of dementia, hypertention, DM with peripheral neuropathy brought to ED on 7/25 with presumed stroke- decreased responsiveness, unable to walk, facial droop with R side weakness. Failed RN stroke swallow screen (did not cough on command). CXR showed no abnormalities, head CT clear, uncooperative for MRI. Bedside swallow eval and speech/ language eval ordered as part of stroke workup. Type of Study: Bedside swallow evaluation Diet Prior to this Study: NPO Temperature Spikes Noted: No Respiratory Status: Room air History of Recent Intubation: No Behavior/Cognition: Alert;Cooperative;Requires cueing Oral Cavity - Dentition: Adequate natural dentition/normal for age Self-Feeding Abilities: Able to feed self Patient Positioning: Upright in bed Baseline Vocal Quality: Normal Volitional Cough: Strong Volitional Swallow: Unable to elicit    Oral/Motor/Sensory Function Overall Oral Motor/Sensory Function: Appears within functional limits for tasks assessed Labial ROM: Within Functional Limits Labial Symmetry: Within Functional Limits Labial Strength: Within Functional Limits  Labial Sensation: Within Functional Limits Lingual ROM: Within  Functional Limits Lingual Symmetry: Within Functional Limits Lingual Strength: Within Functional Limits   Ice Chips Ice chips: Not tested   Thin Liquid Thin Liquid: Within functional limits Presentation: Cup;Straw    Nectar Thick Nectar Thick Liquid: Not tested   Honey Thick Honey Thick Liquid: Not tested   Puree Puree: Within functional limits Presentation: Spoon   Solid   GO    Solid: Impaired Presentation: Self Fed Oral Phase Impairments: Impaired mastication Pharyngeal Phase Impairments: Throat Clearing - Delayed       Metro Kung, MA, CCC-SLP 03/04/2015,9:05 AM  [phone]

## 2015-03-05 ENCOUNTER — Inpatient Hospital Stay (HOSPITAL_COMMUNITY): Payer: Medicare Other

## 2015-03-05 DIAGNOSIS — R410 Disorientation, unspecified: Secondary | ICD-10-CM

## 2015-03-05 DIAGNOSIS — F0391 Unspecified dementia with behavioral disturbance: Secondary | ICD-10-CM

## 2015-03-05 DIAGNOSIS — I639 Cerebral infarction, unspecified: Secondary | ICD-10-CM

## 2015-03-05 DIAGNOSIS — I6789 Other cerebrovascular disease: Secondary | ICD-10-CM

## 2015-03-05 DIAGNOSIS — M25551 Pain in right hip: Secondary | ICD-10-CM | POA: Insufficient documentation

## 2015-03-05 LAB — GLUCOSE, CAPILLARY
GLUCOSE-CAPILLARY: 134 mg/dL — AB (ref 65–99)
GLUCOSE-CAPILLARY: 173 mg/dL — AB (ref 65–99)
Glucose-Capillary: 132 mg/dL — ABNORMAL HIGH (ref 65–99)
Glucose-Capillary: 91 mg/dL (ref 65–99)
Glucose-Capillary: 149 mg/dL — ABNORMAL HIGH (ref 65–99)

## 2015-03-05 LAB — URINE CULTURE

## 2015-03-05 LAB — BASIC METABOLIC PANEL
ANION GAP: 10 (ref 5–15)
BUN: 34 mg/dL — AB (ref 6–20)
CHLORIDE: 107 mmol/L (ref 101–111)
CO2: 23 mmol/L (ref 22–32)
Calcium: 8.6 mg/dL — ABNORMAL LOW (ref 8.9–10.3)
Creatinine, Ser: 1.78 mg/dL — ABNORMAL HIGH (ref 0.44–1.00)
GFR calc non Af Amer: 25 mL/min — ABNORMAL LOW (ref >60)
GFR, EST AFRICAN AMERICAN: 29 mL/min — AB (ref >60)
Glucose, Bld: 136 mg/dL — ABNORMAL HIGH (ref 65–99)
Potassium: 4.1 mmol/L (ref 3.5–5.1)
Sodium: 140 mmol/L (ref 135–145)

## 2015-03-05 LAB — CBC
HCT: 33.3 % — ABNORMAL LOW (ref 36.0–46.0)
HEMOGLOBIN: 10.6 g/dL — AB (ref 12.0–15.0)
MCH: 26 pg (ref 26.0–34.0)
MCHC: 31.8 g/dL (ref 30.0–36.0)
MCV: 81.8 fL (ref 78.0–100.0)
PLATELETS: 169 10*3/uL (ref 150–400)
RBC: 4.07 MIL/uL (ref 3.87–5.11)
RDW: 14.9 % (ref 11.5–15.5)
WBC: 4.2 10*3/uL (ref 4.0–10.5)

## 2015-03-05 MED ORDER — INSULIN ASPART 100 UNIT/ML ~~LOC~~ SOLN
0.0000 [IU] | Freq: Three times a day (TID) | SUBCUTANEOUS | Status: DC
  Administered 2015-03-06 (×2): 1 [IU] via SUBCUTANEOUS
  Administered 2015-03-07: 3 [IU] via SUBCUTANEOUS

## 2015-03-05 MED ORDER — DILTIAZEM HCL ER COATED BEADS 180 MG PO CP24
300.0000 mg | ORAL_CAPSULE | Freq: Every day | ORAL | Status: DC
  Administered 2015-03-06 – 2015-03-07 (×2): 300 mg via ORAL
  Filled 2015-03-05 (×4): qty 1

## 2015-03-05 NOTE — Progress Notes (Signed)
  Echocardiogram 2D Echocardiogram has been performed.  Nolon Rod 03/05/2015, 11:40 AM

## 2015-03-05 NOTE — Clinical Social Work Note (Signed)
Clinical Social Worker has attempted to contact patient's daughter, [name] to complete psychosocial assessment for SNF placement and left a message for a returned phone call. Patient is disoriented.   CSW remains available as needed.   Derenda Fennel, MSW, LCSWA [phone] 03/05/2015 11:04 AM

## 2015-03-05 NOTE — Progress Notes (Signed)
STROKE TEAM PROGRESS NOTE   HISTORY  All history obtained from chart Samantha Bond is an 81 y.o. female with no LSN. patient was brought to ED secondary to showing a right facial droop, right gaze preference and leaning to the right. Per chart she lives with her son and was found in both emesis and urine. Patient currently believes she is at home and will not answer any questions.   Date last known well: Unable to determine Time last known well: Unable to determine tPA Given: No: no last seen normal   SUBJECTIVE (INTERVAL HISTORY) No family members present. The patient remains confused. She has a history of dementia. She follows some simple commands.   She required Haldol during the night for agitation.   OBJECTIVE Temp:  [97 F (36.1 C)-98.4 F (36.9 C)] 98.4 F (36.9 C) (07/27 0902) Pulse Rate:  [70-81] 70 (07/27 0902) Cardiac Rhythm:  [-]  Resp:  [16-20] 20 (07/27 0902) BP: (134-169)/(59-76) 169/76 mmHg (07/27 0902) SpO2:  [97 %-100 %] 100 % (07/27 0902)   Recent Labs Lab 03/04/15 1154 03/04/15 1644 03/04/15 2041 03/05/15 0020 03/05/15 0454  GLUCAP 165* 86 188* 134* 132*    Recent Labs Lab 03/03/15 1344 03/03/15 1410 03/04/15 0620 03/05/15 0505  NA 141 141 141 140  K 3.6 3.6 3.6 4.1  CL 105 103 107 107  CO2 25  --  23 23  GLUCOSE 135* 131* 132* 136*  BUN 44* 47* 42* 34*  CREATININE 2.50* 2.50* 2.14* 1.78*  CALCIUM 9.1  --  8.8* 8.6*  MG  --   --  2.3  --     Recent Labs Lab 03/03/15 1344  AST 25  ALT 12*  ALKPHOS 95  BILITOT 0.8  PROT 7.0  ALBUMIN 3.6    Recent Labs Lab 03/03/15 1344 03/03/15 1410 03/05/15 0743  WBC 4.3  --  4.2  HGB 13.3 15.3* 10.6*  HCT 41.5 45.0 33.3*  MCV 82.3  --  81.8  PLT 184  --  169   No results for input(s): CKTOTAL, CKMB, CKMBINDEX, TROPONINI in the last 168 hours. No results for input(s): LABPROT, INR in the last 72 hours.  Recent Labs  03/03/15 1344  COLORURINE AMBER*  LABSPEC 1.026  PHURINE  5.0  GLUCOSEU NEGATIVE  HGBUR NEGATIVE  BILIRUBINUR MODERATE*  KETONESUR 15*  PROTEINUR 30*  UROBILINOGEN 1.0  NITRITE NEGATIVE  LEUKOCYTESUR NEGATIVE       Component Value Date/Time   CHOL 263* 03/04/2015 0620   TRIG 104 03/04/2015 0620   HDL 64 03/04/2015 0620   CHOLHDL 4.1 03/04/2015 0620   VLDL 21 03/04/2015 0620   LDLCALC 178* 03/04/2015 0620   Lab Results  Component Value Date   HGBA1C 6.6* 03/03/2015   No results found for: LABOPIA, COCAINSCRNUR, LABBENZ, AMPHETMU, THCU, LABBARB  No results for input(s): ETH in the last 168 hours.   Imaging   Ct Head Wo Contrast 03/03/2015    No acute intracranial pathology.      Dg Chest Port 1 View 03/03/2015    No active cardiopulmonary disease is evident on this rotated radiograph. No change from priors.         PHYSICAL EXAM Elderly lady not in distress. . Afebrile. Head is nontraumatic. Neck is supple without bruit.    Cardiac exam no murmur or gallop. Lungs are clear to auscultation. Distal pulses are well felt. Neurological Exam :  Awake alert oriented 2. Diminished attention, registration and recall. Follows midline  and one-step and a few two-step commands. No aphasia or dysarthria or apraxia. Extraocular movements are full range without nystagmus.  Blinks to threat right more than left. Minimum left lower facial asymmetry. Tongue midline. Motor system exam no upper or lower extremity drift. Diminished fine finger movements on the left. Orbits right over left upper extremity. Symmetric lower extremity strength. Sensation appears preserved bilaterally. Coordination is slow but accurate. Gait was not tested.  ASSESSMENT/PLAN Ms. Unnamed Samantha Bond is a 81 y.o. female with history of hypertension, dementia, diabetes mellitus, peripheral neuropathy, and spinal stenosis presenting with right facial droop and right hemiparesis.  She did not receive IV t-PA due to unknown time of onset.  Stroke / TIA:  Dominant secondary  to small vessel disease.  Resultant  right hemiparesis  MRI  pending  MRA  pending  Carotid Doppler  pending  2D Echo pending  LDL 178  HgbA1c 6.6  Subcutaneous heparin for VTE prophylaxis DIET DYS 3 Room service appropriate?: Yes; Fluid consistency:: Thin  no antithrombotic prior to admission, now on aspirin 300 mg suppository daily  Patient counseled to be compliant with her antithrombotic medications  Ongoing aggressive stroke risk factor management  Therapy recommendations: Skilled nursing facility recommended.  Disposition: Pending  Hypertension  Home meds: Diltiazem, hydrochlorothiazide, and losartan.  Blood pressure running high  Permissive hypertension <220/120 for 24-48 hours and then gradually normalize within 5-7 days  Patient counseled to be compliant with her blood pressure medications  Hyperlipidemia  Home meds: No lipid lowering medications prior to admission  LDL 178, goal < 70  Add Lipitor 40 mg daily  Continue statin at discharge  Diabetes  HgbA1c 6.6, goal < 7.0  Controlled  Other Stroke Risk Factors  Advanced age  Obesity   Dementia with behavioural disturbance- agitation and confusion- likely baseline   Other Active Problems  BUN 47 creatinine 2.5 - baseline creatinine 1.08 - ARB, NSAID, and HCTZ held.    Other Pertinent History  Dementia  PLAN CT head 2 shows no acute infarct. It is unclear as to what happened but given the fact that patient had incontinence, emesis and transient neurological deficits is possible she may have had an unwitnessed seizure. Hence we will check EEG.  Hospital day # 2     03/05/2015, 1:03 PM  I have personally examined this patient, reviewed notes, independently viewed imaging studies, participated in medical decision making and plan of care. I have made any additions or clarifications directly to the above note. Agree with note above.   Delia Heady, MD Medical Director [facility]  Stroke Center Pager: [phone] 03/05/2015 1:03 PM    To contact Stroke Continuity provider, please refer to [email or web address]. After hours, contact General Neurology

## 2015-03-05 NOTE — Procedures (Signed)
ELECTROENCEPHALOGRAM REPORT   Patient: Samantha Bond       Room #: 4N14 EEG No. ID: [id number] Age: 81 y.o.        Sex: female Referring Physician: Pearlean Brownie Report Date:  03/05/2015        Interpreting Physician: Thana Farr  History: [name] is an 81 y.o. female presenting with left sided weakness and confusion  Medications:  Scheduled: . aspirin  300 mg Rectal Daily   Or  . aspirin  325 mg Oral Daily  . atorvastatin  40 mg Oral q1800  . [START ON 03/06/2015] diltiazem  300 mg Oral Daily  . heparin  5,000 Units Subcutaneous 3 times per day  . insulin aspart  0-9 Units Subcutaneous 6 times per day  . senna  1 tablet Oral BID    Conditions of Recording:  This is a 16 channel EEG carried out with the patient in the awake and drowsy states.  Description:  The waking background activity consists of a low voltage, symmetrical, fairly well organized, 9 Hz alpha activity, seen from the parieto-occipital and posterior temporal regions.  Low voltage fast activity, poorly organized, is seen anteriorly and is at times superimposed on more posterior regions.  A mixture of theta and alpha rhythms are seen from the central and temporal regions. The patient drowses with slowing to irregular, low voltage theta and beta activity.   Stage II sleep is not obtained. No epileptiform activity is noted Hyperventilation and intermittent photic stimulation were not performed.   IMPRESSION: This is a normal awake and drowsy electroencephalogram.  No epileptiform activity is noted.   Thana Farr, MD [facility] [phone] 03/05/2015, 7:28 PM

## 2015-03-05 NOTE — Progress Notes (Signed)
PROGRESS NOTE  Samantha Bond RUE:[id number] DOB: 1926-08-30 DOA: 03/03/2015 PCP: Pearla Dubonnet, MD  HPI/Recap of past 24 hours:  Calm and cooperative this am, Conversant and following simple command at this time, no family member in room C/o right hip pain, only oriented to self  Assessment/Plan: Principal Problem:   Stroke Active Problems:   DM (diabetes mellitus)   Acute kidney injury   HTN (hypertension)   Dementia   Confusion  Right-sided weakness Symptom seems has resolved, negative for infarct by CT x2 on 325 mg aspirin/lipitor EEG pending to r/o seizure, Appreciate neurology input  Acute kidney injury Baseline creatinine is 1.08 (03/2014). Creatinine is 2.5 on 03/03/15 Hold ARB, NSAIDs (naproxen) and HCTZ. Give IV fluids. Cr slightly improved, UA no infection  Diabetes mellitus Hold all oral medications Place on sliding scale insulin with mealtime coverage.  Hypertension Increase diltiazem.  ARB and HCTZ held since admission due to acute kidney injury. Need tight bp control due to MRI brain showed :Scattered areas of chronic micro hemorrhage the brain likely related to chronic hypertension  Dementia Continue Namenda. Continue Haldol when necessary at decreased dose. Hold Ativan.  Right hip pain, hip /pelvic xray no fracture. +arthritis.   Code Status: full  Family Communication: no family in the room  Disposition Plan: SNF   Consultants:  neurology  Procedures:  EEG  Antibiotics:  none   Objective: BP 170/82 mmHg  Pulse 70  Temp(Src) 98.5 F (36.9 C) (Axillary)  Resp 20  SpO2 95% No intake or output data in the 24 hours ending 03/05/15 1714 There were no vitals filed for this visit.  Exam:   General:  NAD, demented elderly, following command intermittently  Cardiovascular: RRR  Respiratory: CTABL  Abdomen: Soft/ND/NT, positive BS  Musculoskeletal: No Edema  Neuro: demented, intermittent agitation, right sided  weakness, not cooperative with physical exam consistently  Data Reviewed: Basic Metabolic Panel:  Recent Labs Lab 03/03/15 1344 03/03/15 1410 03/04/15 0620 03/05/15 0505  NA 141 141 141 140  K 3.6 3.6 3.6 4.1  CL 105 103 107 107  CO2 25  --  23 23  GLUCOSE 135* 131* 132* 136*  BUN 44* 47* 42* 34*  CREATININE 2.50* 2.50* 2.14* 1.78*  CALCIUM 9.1  --  8.8* 8.6*  MG  --   --  2.3  --    Liver Function Tests:  Recent Labs Lab 03/03/15 1344  AST 25  ALT 12*  ALKPHOS 95  BILITOT 0.8  PROT 7.0  ALBUMIN 3.6   No results for input(s): LIPASE, AMYLASE in the last 168 hours. No results for input(s): AMMONIA in the last 168 hours. CBC:  Recent Labs Lab 03/03/15 1344 03/03/15 1410 03/05/15 0743  WBC 4.3  --  4.2  HGB 13.3 15.3* 10.6*  HCT 41.5 45.0 33.3*  MCV 82.3  --  81.8  PLT 184  --  169   Cardiac Enzymes:   No results for input(s): CKTOTAL, CKMB, CKMBINDEX, TROPONINI in the last 168 hours. BNP (last 3 results) No results for input(s): BNP in the last 8760 hours.  ProBNP (last 3 results) No results for input(s): PROBNP in the last 8760 hours.  CBG:  Recent Labs Lab 03/04/15 1644 03/04/15 2041 03/05/15 0020 03/05/15 0454 03/05/15 1234  GLUCAP 86 188* 134* 132* 91    Recent Results (from the past 240 hour(s))  Urine culture     Status: None   Collection Time: 03/03/15  1:44 PM  Result Value Ref  Range Status   Specimen Description URINE, CATHETERIZED  Final   Special Requests NONE  Final   Culture MULTIPLE SPECIES PRESENT, SUGGEST RECOLLECTION  Final   Report Status 03/05/2015 FINAL  Final     Studies: Dg Pelvis 1-2 Views  03/05/2015   CLINICAL DATA:  Generalized right hip pain since yesterday.  EXAM: PELVIS - 1-2 VIEW  COMPARISON:  None.  FINDINGS: Bone mineralization is within normal limits. Moderate degenerative changes are noted at the SI joints bilaterally. Mild degenerative changes are noted in the lower lumbar spine as well. This single view  demonstrates a relatively normal appearance of the hips bilaterally. No acute fracture or dislocation is evident.  IMPRESSION: 1. Moderate degenerative changes in the SI joints and mild degenerative change in the lower lumbar spine. 2. No acute abnormality of the hips.   Electronically Signed   By: Marin Roberts M.D.   On: 03/05/2015 15:28   Dg Femur, Min 2 Views Right  03/05/2015   CLINICAL DATA:  Pain.  No history of trauma  EXAM: RIGHT FEMUR 2 VIEWS  COMPARISON:  None.  FINDINGS: Frontal and lateral views obtained. There is no fracture or dislocation. There is mild narrowing of the right hip joint. There is mild generalized joint space narrowing in the knee region with spurring along the patella. No knee joint effusion. No abnormal periosteal reaction.  IMPRESSION: Osteoarthritic change in the right knee and hip joints. No fracture or dislocation. No abnormal periosteal reaction.   Electronically Signed   By: Bretta Bang III M.D.   On: 03/05/2015 15:27    Scheduled Meds: . aspirin  300 mg Rectal Daily   Or  . aspirin  325 mg Oral Daily  . atorvastatin  40 mg Oral q1800  . [START ON 03/06/2015] diltiazem  300 mg Oral Daily  . heparin  5,000 Units Subcutaneous 3 times per day  . insulin aspart  0-9 Units Subcutaneous 6 times per day  . senna  1 tablet Oral BID    Continuous Infusions: . sodium chloride 75 mL/hr at 03/04/15 0840     Time spent:  Erminie Foulks MD, PhD  [facility] Pager [phone]. If 7PM-7AM, please contact night-coverage at [email or web address], password [facility] 03/05/2015, 5:14 PM  LOS: 2 days

## 2015-03-05 NOTE — Clinical Social Work Note (Signed)
Clinical Social Work Assessment  Patient Details  Name: Samantha Bond MRN: [id number] Date of Birth: 11/21/1926  Date of referral:  03/05/15               Reason for consult:  Facility Placement                Permission sought to share information with:  Case Manager, Facility Medical sales representative, PCP, Family Supports Permission granted to share information::  Yes, Verbal Permission Granted  Name::      Ludwig Lean )  Agency::   (SNF's )  Relationship::   (Daughter )  Contact Information:   [phone])  Housing/Transportation Living arrangements for the past 2 months:  Single Family Home Source of Information:  Adult Children Patient Interpreter Needed:  None Criminal Activity/Legal Involvement Pertinent to Current Situation/Hospitalization:  No - Comment as needed Significant Relationships:  Adult Children Lives with:  Adult Children Do you feel safe going back to the place where you live?  Yes Need for family participation in patient care:  Yes (Comment)  Care giving concerns:  Patient requiring continued therapy and 24 hour assistance.    Social Worker assessment / plan:  Visual merchandiser spoke with patient's dtr, [name] in reference to post-acute placement for SNF. CSW introduced CSW role and SNF process. CSW also reviewed SNF list. Pt's dtr reported she is familiar with SNF process. Patient dtr reported patient has been a resident at [facility]) in the past and wishes for patient to return at discharge. Pt's dtr stated she is pleased with the care and therapy the pt received during last admission. No further concerns reported by pt's dtr at this time. FL-2 has been completed and faxed to [facility]. CSW will continue to follow pt and pt's family for continued support and to facilitate pt's discharge needs once medically stable.   Employment status:  Retired Database administrator PT Recommendations:  Skilled Nursing  Facility Information / Referral to community resources:  Skilled Nursing Facility  Patient/Family's Response to care:  Pt asleep and disoriented during assessment. Pt's dtr agreeable to SNF placement at [facility]. Pt's dtr pleasant and appreciated social work intervention.   Patient/Family's Understanding of and Emotional Response to Diagnosis, Current Treatment, and Prognosis:  Patient dtr understanding of continued medical work up and that MRI was positive for stroke. CSW remains available as needed.   Emotional Assessment Appearance:  Appears older than stated age Attitude/Demeanor/Rapport:  Unable to Assess Affect (typically observed):  Unable to Assess Orientation:  Oriented to Self Alcohol / Substance use:  Not Applicable Psych involvement (Current and /or in the community):  No (Comment)  Discharge Needs  Concerns to be addressed:  Home Safety Concerns Readmission within the last 30 days:  No Current discharge risk:  Dependent with Mobility, Cognitively Impaired Barriers to Discharge:  Continued Medical Work up   [employer], MSW, LCSWA [phone] 03/05/2015 12:34 PM

## 2015-03-05 NOTE — Clinical Social Work Placement (Signed)
   CLINICAL SOCIAL WORK PLACEMENT  NOTE  Date:  03/05/2015  Patient Details  Name: BROOKELIN FELBER MRN: [id number] Date of Birth: 1926-09-18  Clinical Social Work is seeking post-discharge placement for this patient at the Skilled  Nursing Facility level of care (*CSW will initial, date and re-position this form in  chart as items are completed):  Yes   Patient/family provided with [place] Clinical Social Work Department's list of facilities offering this level of care within the geographic area requested by the patient (or if unable, by the patient's family).  Yes   Patient/family informed of their freedom to choose among providers that offer the needed level of care, that participate in Medicare, Medicaid or managed care program needed by the patient, have an available bed and are willing to accept the patient.  Yes   Patient/family informed of [facility]'s ownership interest in [facility] and [facility], as well as of the fact that they are under no obligation to receive care at these facilities.  PASRR submitted to EDS on       PASRR number received on 03/05/15     Existing PASRR number confirmed on 03/05/15     FL2 transmitted to all facilities in geographic area requested by pt/family on 03/05/15     FL2 transmitted to all facilities within larger geographic area on       Patient informed that his/her managed care company has contracts with or will negotiate with certain facilities, including the following:   (YES)     Yes   Patient/family informed of bed offers received.  Patient chooses bed at  [facility] )     Physician recommends and patient chooses bed at      Patient to be transferred to  [facility] ) on  .  Patient to be transferred to facility by       Patient family notified on   of transfer.  Name of family member notified:        PHYSICIAN Please sign FL2     Additional Comment:     _______________________________________________ Derenda Fennel, MSW, LCSWA [phone] 03/05/2015 12:35 PM

## 2015-03-05 NOTE — Progress Notes (Signed)
VASCULAR LAB PRELIMINARY  PRELIMINARY  PRELIMINARY  PRELIMINARY  Carotid duplex  completed.    Preliminary report:  Bilateral:  1-39% ICA stenosis.  Vertebral artery flow is antegrade.      Marcha Licklider, RVT 03/05/2015, 12:20 PM

## 2015-03-05 NOTE — Progress Notes (Signed)
Bedside EEG completed, results pending. 

## 2015-03-05 NOTE — Clinical Documentation Improvement (Signed)
  In next Note/DS please clarify if possible patient's altered mental status with agitation and combativeness.  . Document the etiology of the altered mental status as: --Encephalopathy    Metabolic/septic    Secondary to CVA    Other (specify) -- Dementia with behavioral disturbances -- Other condition -- Unable to determine  Thank Harle Battiest RN CDI [phone] HIM department

## 2015-03-06 DIAGNOSIS — F0390 Unspecified dementia without behavioral disturbance: Secondary | ICD-10-CM

## 2015-03-06 DIAGNOSIS — M25551 Pain in right hip: Secondary | ICD-10-CM

## 2015-03-06 DIAGNOSIS — I639 Cerebral infarction, unspecified: Secondary | ICD-10-CM

## 2015-03-06 DIAGNOSIS — N179 Acute kidney failure, unspecified: Secondary | ICD-10-CM

## 2015-03-06 LAB — GLUCOSE, CAPILLARY
GLUCOSE-CAPILLARY: 126 mg/dL — AB (ref 65–99)
GLUCOSE-CAPILLARY: 101 mg/dL — AB (ref 65–99)
Glucose-Capillary: 133 mg/dL — ABNORMAL HIGH (ref 65–99)
Glucose-Capillary: 138 mg/dL — ABNORMAL HIGH (ref 65–99)

## 2015-03-06 LAB — BASIC METABOLIC PANEL
Anion gap: 5 (ref 5–15)
BUN: 24 mg/dL — ABNORMAL HIGH (ref 6–20)
CALCIUM: 8.7 mg/dL — AB (ref 8.9–10.3)
CO2: 25 mmol/L (ref 22–32)
Chloride: 111 mmol/L (ref 101–111)
Creatinine, Ser: 1.42 mg/dL — ABNORMAL HIGH (ref 0.44–1.00)
GFR calc Af Amer: 38 mL/min — ABNORMAL LOW (ref >60)
GFR calc non Af Amer: 32 mL/min — ABNORMAL LOW (ref >60)
Glucose, Bld: 119 mg/dL — ABNORMAL HIGH (ref 65–99)
POTASSIUM: 3.5 mmol/L (ref 3.5–5.1)
SODIUM: 141 mmol/L (ref 135–145)

## 2015-03-06 MED ORDER — ACETAMINOPHEN 325 MG PO TABS
650.0000 mg | ORAL_TABLET | Freq: Four times a day (QID) | ORAL | Status: DC | PRN
  Administered 2015-03-06 – 2015-03-07 (×3): 650 mg via ORAL
  Filled 2015-03-06 (×3): qty 2

## 2015-03-06 NOTE — Progress Notes (Signed)
Physical Therapy Treatment Patient Details Name: Samantha Bond MRN: [id number] DOB: 11-Nov-1926 Today's Date: 03/06/2015    History of Present Illness 81 y.o. female with dementia, HTN, DM, and peripheral neruopathy. Brought to ED with R facial droop and R side weakness. Head CT is negative.  MRI - no acute infarct    PT Comments    Pt continues to demonstrate limited participation in therapy 2/2 apparent altered cognitive status and generalized weakness.  Max +2 assist for sit<>stand w/ max verbal and tactile cues.  Pt c/o buttocks pain and pillow placed on chair for comfort.  Additionally pt reports R knee pain which is not TTP and no apparent insult upon examination.  Pt will benefit from continued skilled PT services to increase functional independence and safety.   Follow Up Recommendations  SNF;Supervision/Assistance - 24 hour     Equipment Recommendations  Other (comment) (TBD)    Recommendations for Other Services       Precautions / Restrictions Precautions Precautions: Fall Restrictions Weight Bearing Restrictions: No    Mobility  Bed Mobility               General bed mobility comments: in recliner.  Per RN pt required max +2 assist this morning for stand pivot to recliner chair.  Transfers Overall transfer level: Needs assistance Equipment used: Rolling walker (2 wheeled) Transfers: Sit to/from Stand Sit to Stand: Max assist;+2 physical assistance         General transfer comment: Max tactile cues and hand over hand for placement of hands B on armrests so pt could push up to standing.  Tactile cues to transfer hands to RW once standing and max assist to maintain standing as pt demonstrates forward trunk lean.  Ambulation/Gait                 Stairs            Wheelchair Mobility    Modified Rankin (Stroke Patients Only)       Balance Overall balance assessment: Needs assistance Sitting-balance support: Bilateral upper  extremity supported;Feet supported Sitting balance-Leahy Scale: Poor Sitting balance - Comments: If not holding onto armrests for support pt tends to lean posteriorly in chair Postural control: Posterior lean Standing balance support: Bilateral upper extremity supported;During functional activity Standing balance-Leahy Scale: Zero Standing balance comment: Requires max assist to maintain upright position                    Cognition Arousal/Alertness: Awake/alert Behavior During Therapy: Flat affect Overall Cognitive Status: No family/caregiver present to determine baseline cognitive functioning (Pt oriented to person only)       Memory: Decreased short-term memory              Exercises General Exercises - Lower Extremity Long Arc Quad: AROM;Both;15 reps;Seated    General Comments General comments (skin integrity, edema, etc.): Pt unable to discern the season when asked to look out the [place].  Pt looking to L and R during session w/ no apparent gaze preference.  Pt able to perform LAQ B and reaching activity in sitting w/ BUEs.      Pertinent Vitals/Pain Pain Assessment: Faces Faces Pain Scale: Hurts a little bit Pain Location: buttocks and R knee (no apparent insult to R knee upon examination and no TTP) Pain Descriptors / Indicators: Dull Pain Intervention(s): Limited activity within patient's tolerance;Monitored during session;Repositioned    Home Living  Prior Function            PT Goals (current goals can now be found in the care plan section) Acute Rehab PT Goals Patient Stated Goal: Patient unable to state PT Goal Formulation: Patient unable to participate in goal setting Time For Goal Achievement: 03/18/15 Potential to Achieve Goals: Fair Progress towards PT goals: Progressing toward goals    Frequency  Min 2X/week    PT Plan Current plan remains appropriate    Co-evaluation             End of Session  Equipment Utilized During Treatment: Gait belt Activity Tolerance: Other (comment);Patient limited by fatigue (limited 2/2 AMS ) Patient left: in chair;with call bell/phone within reach;with chair alarm set;with SCD's reapplied;Other (comment);with nursing/sitter in room (w/ Dietician and Nurse tech in room)     Time: [phone] PT Time Calculation (min) (ACUTE ONLY): 22 min  Charges:  $Therapeutic Activity: 8-22 mins                    G Codes:      Michail Jewels PT, [place] [phone] Pager: [phone] 03/06/2015, 11:44 AM

## 2015-03-06 NOTE — Progress Notes (Signed)
Initial Nutrition Assessment  INTERVENTION:   Monitor PO intake for adequacy  NUTRITION DIAGNOSIS:   Predicted suboptimal nutrient intake related to lethargy/confusion as evidenced by other (see comment) (pt's chart/recent stroke).   GOAL:   Patient will meet greater than or equal to 90% of their needs   MONITOR:   PO intake, Weight trends, I & O's, Skin  REASON FOR ASSESSMENT:   Consult Assessment of nutrition requirement/status  ASSESSMENT:   81 y.o. female, with dementia, hypertension, diabetes mellitus with peripheral neuropathy.She was brought to the emergency department from home with presumed stroke.   Pt slightly confused at time of visit but, able to answer questions. She states that her appetite is good and she ate well at breakfast. No weight history available but, pt appears well nourished upon physical exam. Per nursing notes, pt has been eating 50-95% of meals.  Labs reviewed.   Diet Order:  DIET DYS 3 Room service appropriate?: Yes; Fluid consistency:: Thin  Skin:  Reviewed, no issues  Last BM:  7/26  Height:   Ht Readings from Last 1 Encounters:  08/09/13  (1.676 m)    Weight:   Wt Readings from Last 1 Encounters:  11/16/13 177 lb 11.1 oz (80.6 kg)    Ideal Body Weight:  59 kg  BMI:  There is no weight on file to calculate BMI.  Estimated Nutritional Needs:   Kcal:  1600-1800  Protein:  80-90 grams  Fluid:  1.6-1.8 L/day  EDUCATION NEEDS:   No education needs identified at this time  Ian Malkin RD, LDN Inpatient Clinical Dietitian Pager: [phone] After Hours Pager: [phone]

## 2015-03-06 NOTE — Progress Notes (Signed)
Occupational Therapy Treatment Patient Details Name: Samantha Bond MRN: [id number] DOB: 22-Oct-1926 Today's Date: 03/06/2015    History of present illness 81 y.o. female with dementia, HTN, DM, and peripheral neruopathy. Brought to ED with R facial droop and R side weakness. Head CT is negative.  MRI - no acute infarct   OT comments  Pt with dementia, oriented to self and reports she is in the hospital today, and able to follow 1 step commands consistently. Pt required (A) for repositioning in chair. Pt able to complete grooming tasks seated with set-up and VC for task redirection. Continue to recommend SNF upon d/c for safety with ADLs.   Follow Up Recommendations  SNF;Supervision/Assistance - 24 hour    Equipment Recommendations       Recommendations for Other Services      Precautions / Restrictions Precautions Precautions: Fall Restrictions Weight Bearing Restrictions: No       Mobility Bed Mobility               General bed mobility comments: Pt in chair upon arrival  Transfers                      Balance Overall balance assessment: Needs assistance Sitting-balance support: Feet supported;No upper extremity supported                               ADL       Grooming: Wash/dry face;Oral care;Set up;Sitting                                        Vision                 Additional Comments: Pt able to find objects on L side of tray   Perception     Praxis      Cognition   Behavior During Therapy: Agitated;Flat affect Overall Cognitive Status: No family/caregiver present to determine baseline cognitive functioning (Pt aware of location today)                       Extremity/Trunk Assessment               Exercises     Shoulder Instructions       General Comments      Pertinent Vitals/ Pain       Pain Assessment: No/denies pain  Home Living                                          Prior Functioning/Environment              Frequency Min 2X/week     Progress Toward Goals  OT Goals(current goals can now be found in the care plan section)  Progress towards OT goals: Progressing toward goals  ADL Goals Pt Will Perform Grooming: sitting;with min assist Pt Will Perform Upper Body Dressing: with min assist;sitting Pt Will Transfer to Toilet: with mod assist;stand pivot transfer;bedside commode  Plan Discharge plan remains appropriate    Co-evaluation                 End of Session     Activity Tolerance Patient tolerated treatment well  Patient Left in chair;with call bell/phone within reach;with chair alarm set   Nurse Communication          Time: 8119-1478 OT Time Calculation (min): 19 min  Charges:    Marden Noble 03/06/2015, 10:43 AM

## 2015-03-06 NOTE — Progress Notes (Signed)
STROKE TEAM PROGRESS NOTE   HISTORY  All history obtained from chart Samantha Bond is an 81 y.o. female with no LSN. patient was brought to ED secondary to showing a right facial droop, right gaze preference and leaning to the right. Per chart she lives with her son and was found in both emesis and urine. Patient currently believes she is at home and will not answer any questions.   Date last known well: Unable to determine Time last known well: Unable to determine tPA Given: No: no last seen normal   SUBJECTIVE (INTERVAL HISTORY) No family members present. The patient remains confused. She has a history of dementia. She follows some simple commands.    .   OBJECTIVE Temp:  [97.6 F (36.4 C)-98.5 F (36.9 C)] 97.7 F (36.5 C) (07/28 1324) Pulse Rate:  [70-83] 83 (07/28 1324) Cardiac Rhythm:  [-] Normal sinus rhythm (07/28 1251) Resp:  [18-20] 18 (07/28 1324) BP: (138-183)/(50-82) 138/50 mmHg (07/28 1324) SpO2:  [95 %-100 %] 98 % (07/28 1324)   Recent Labs Lab 03/05/15 1234 03/05/15 1729 03/05/15 2109 03/06/15 0617 03/06/15 1124  GLUCAP 91 149* 173* 126* 133*    Recent Labs Lab 03/03/15 1344 03/03/15 1410 03/04/15 0620 03/05/15 0505 03/06/15 0523  NA 141 141 141 140 141  K 3.6 3.6 3.6 4.1 3.5  CL 105 103 107 107 111  CO2 25  --  23 23 25   GLUCOSE 135* 131* 132* 136* 119*  BUN 44* 47* 42* 34* 24*  CREATININE 2.50* 2.50* 2.14* 1.78* 1.42*  CALCIUM 9.1  --  8.8* 8.6* 8.7*  MG  --   --  2.3  --   --     Recent Labs Lab 03/03/15 1344  AST 25  ALT 12*  ALKPHOS 95  BILITOT 0.8  PROT 7.0  ALBUMIN 3.6    Recent Labs Lab 03/03/15 1344 03/03/15 1410 03/05/15 0743  WBC 4.3  --  4.2  HGB 13.3 15.3* 10.6*  HCT 41.5 45.0 33.3*  MCV 82.3  --  81.8  PLT 184  --  169   No results for input(s): CKTOTAL, CKMB, CKMBINDEX, TROPONINI in the last 168 hours. No results for input(s): LABPROT, INR in the last 72 hours. No results for input(s): COLORURINE,  LABSPEC, PHURINE, GLUCOSEU, HGBUR, BILIRUBINUR, KETONESUR, PROTEINUR, UROBILINOGEN, NITRITE, LEUKOCYTESUR in the last 72 hours.  Invalid input(s): APPERANCEUR     Component Value Date/Time   CHOL 263* 03/04/2015 0620   TRIG 104 03/04/2015 0620   HDL 64 03/04/2015 0620   CHOLHDL 4.1 03/04/2015 0620   VLDL 21 03/04/2015 0620   LDLCALC 178* 03/04/2015 0620   Lab Results  Component Value Date   HGBA1C 6.6* 03/03/2015   No results found for: LABOPIA, COCAINSCRNUR, LABBENZ, AMPHETMU, THCU, LABBARB  No results for input(s): ETH in the last 168 hours.   Imaging   Ct Head Wo Contrast 03/03/2015    No acute intracranial pathology.      Dg Chest Port 1 View 03/03/2015    No active cardiopulmonary disease is evident on this rotated radiograph. No change from priors.     MRI Brain : 03/04/2015 : Atrophy and chronic microvascular ischemia. No acute infarct. Scattered areas of chronic micro hemorrhage the brain likely related to chronic hypertension MRA is significantly degraded by motion. Moderate intracranial atherosclerotic disease as above  EEG : 03/05/15 :normal  PHYSICAL EXAM Elderly lady not in distress. . Afebrile. Head is nontraumatic. Neck is supple without bruit.  Cardiac exam no murmur or gallop. Lungs are clear to auscultation. Distal pulses are well felt. Neurological Exam :  Awake alert oriented 2. Diminished attention, registration and recall. Follows midline and one-step and a few two-step commands. No aphasia or dysarthria or apraxia. Extraocular movements are full range without nystagmus.  Blinks to threat right more than left. Minimum left lower facial asymmetry. Tongue midline. Motor system exam no upper or lower extremity drift. Diminished fine finger movements on the left. Orbits right over left upper extremity. Symmetric lower extremity strength. Sensation appears preserved bilaterally. Coordination is slow but accurate. Gait was not  tested.  ASSESSMENT/PLAN Samantha Bond is a 81 y.o. female with history of hypertension, dementia, diabetes mellitus, peripheral neuropathy, and spinal stenosis presenting with right facial droop and right hemiparesis.  She did not receive IV t-PA due to unknown time of onset.  Stroke / TIA:  Dominant secondary to small vessel disease.  Resultant  right hemiparesis  MRI  No acute infarct  MRA  Motion degraded, significant athero disease  Carotid Doppler  Bilateral: 1-39% ICA stenosis. Vertebral artery flow is antegrade.   2D Echo Technically difficult study with limited echo windows. - Left ventricle: The cavity size was normal. There was moderate concentric hypertrophy. Systolic function was vigorous. The estimated ejection fraction was in the range of 65% to 70%. Doppler parameters are consistent with abnormal left ventricular relaxation  LDL 178  HgbA1c 6.6  Subcutaneous heparin for VTE prophylaxis DIET DYS 3 Room service appropriate?: Yes; Fluid consistency:: Thin  no antithrombotic prior to admission, now on aspirin 300 mg suppository daily  Patient counseled to be compliant with her antithrombotic medications  Ongoing aggressive stroke risk factor management  Therapy recommendations: Skilled nursing facility recommended.  Disposition: Pending  Hypertension  Home meds: Diltiazem, hydrochlorothiazide, and losartan.  Blood pressure running high  Permissive hypertension <220/120 for 24-48 hours and then gradually normalize within 5-7 days  Patient counseled to be compliant with her blood pressure medications  Hyperlipidemia  Home meds: No lipid lowering medications prior to admission  LDL 178, goal < 70  Add Lipitor 40 mg daily  Continue statin at discharge  Diabetes  HgbA1c 6.6, goal < 7.0  Controlled  Other Stroke Risk Factors  Advanced age  Obesity   Dementia with behavioural disturbance- agitation and confusion-  likely baseline   Other Active Problems  BUN 47 creatinine 2.5 - baseline creatinine 1.08 - ARB, NSAID, and HCTZ held.    Other Pertinent History  Dementia  PLAN   It is unclear as to what happened but given the fact that patient had incontinence, emesis and transient neurological deficits is possible she may have had an unwitnessed seizure but EEG.is normal as well.  Hospital day # 3     03/06/2015, 2:07 PM  I have personally examined this patient, reviewed notes, independently viewed imaging studies, participated in medical decision making and plan of care. I have made any additions or clarifications directly to the above note. No further neurological workup is necessary at this time. Agree with transfer to nursing facility. Kindly call for questions. Stroke team will sign off.  Delia Heady, MD Medical Director [facility] Pager: [phone] 03/06/2015 2:07 PM    To contact Stroke Continuity provider, please refer to [email or web address]. After hours, contact General Neurology

## 2015-03-06 NOTE — Progress Notes (Addendum)
Speech Language Pathology Treatment: Dysphagia;Cognitive-Linquistic  Patient Details Name: Samantha Bond MRN: [id number] DOB: 1927/03/27 Today's Date: 03/06/2015 Time: 4098-1191 SLP Time Calculation (min) (ACUTE ONLY): 17 min  Assessment / Plan / Recommendation Clinical Impression  Dysphagia treatment provided for diet tolerance and review of compensatory strategies. Pt consumed dysphagia 3 consistency and thin liquids with no overt s/s of aspiration; continues to demonstrate prolonged mastication of solid consistencies but did not observe any oral holding or residue. Provided min verbal cues for pt to take small sips by straw. Pt was more alert and attentive compared to previous visit; aspiration risk appears mild at this time. Recommend continuing dysphagia 3 diet with thin liquids. Will sign off for dysphagia goals.  Also provided cognitive-linguistic treatment for orientation, problem solving, and attention. Pt was responsive and attentive to PO trials; did not require redirection to task today as was required on previous visit. Pt oriented to self and place; however, continues to be disoriented to time and situation. Pt unaware of call bell; reviewed with pt and used teach-back method to ensure understanding. Pt responsive to conversational questions today but continues to provide mostly 1-3 word responses. Will continue to follow to increase safety in the acute care setting.    HPI Other Pertinent Information: Pt is an 81 y.o. female with PMH of dementia, hypertention, DM with peripheral neuropathy brought to ED on 7/25 with presumed stroke- decreased responsiveness, unable to walk, facial droop with R side weakness. Failed RN stroke swallow screen (did not cough on command). CXR showed no abnormalities, head CT clear, uncooperative for MRI. Bedside swallow eval and speech/ language eval ordered as part of stroke workup.   Pertinent Vitals Pain Assessment: No/denies pain  SLP Plan  Goals  updated    Recommendations Diet recommendations: Dysphagia 3 (mechanical soft);Thin liquid Liquids provided via: Cup;Straw Medication Administration: Whole meds with liquid Supervision: Patient able to self feed;Intermittent supervision to cue for compensatory strategies Compensations: Slow rate;Small sips/bites Postural Changes and/or Swallow Maneuvers: Seated upright 90 degrees;Upright 30-60 min after meal              Oral Care Recommendations: Oral care BID Follow up Recommendations: Skilled Nursing facility Plan: Goals updated    GO     Metro Kung, MA, CCC-SLP 03/06/2015, 10:07 AM [phone]

## 2015-03-06 NOTE — Progress Notes (Signed)
PROGRESS NOTE  JESSAMYN WATTERSON WUJ:[id number] DOB: 1927-05-20 DOA: 03/03/2015 PCP: Pearla Dubonnet, MD  HPI/Recap of past 24 hours:  Still complains of R hip pain, limiting mobility  Assessment/Plan:  Right-sided weakness and Facial droop -resolved, negative for infarct by MRI, suspect TIA vs seizure -EEG normal -continue Aspirin/lipitor -Neuro following -LDL 178, HgbA1c 6.6 -ECHO with normal EF and grade 1 DD -carotid duplex pending  Acute kidney injury Baseline creatinine is 1.08 (03/2014). Creatinine is 2.5 on 03/03/15 Hold ARB, NSAIDs (naproxen) and HCTZ. -improved with hydration, creatinine 1.4 today  Diabetes mellitus Hold all oral medications -stable, SSI  Hypertension Increase diltiazem.  ARB and HCTZ held since admission due to acute kidney injury. Need tight bp control due to MRI brain showed :Scattered areas of chronic micro hemorrhage the brain likely related to chronic hypertension  Dementia -with some confusion at baseline Continue Namenda. Continue Haldol when necessary at decreased dose. Held Ativan.  Right hip pain: - hip /pelvic xray no fracture. +arthritis.  -tylenol PRN  Code Status: full  Family Communication: no family in the room  Disposition Plan: SNF in 1-2days   Consultants:  neurology  Procedures:  EEG  Antibiotics:  none   Objective: BP 149/53 mmHg  Pulse 76  Temp(Src) 97.6 F (36.4 C) (Oral)  Resp 18  SpO2 100%  Intake/Output Summary (Last 24 hours) at 03/06/15 1320 Last data filed at 03/06/15 0900  Gross per 24 hour  Intake    120 ml  Output      0 ml  Net    120 ml   There were no vitals filed for this visit.  Exam:   General:  NAD, demented elderly, following command intermittently  Cardiovascular: RRR  Respiratory: CTABL  Abdomen: Soft/ND/NT, positive BS  Musculoskeletal: No Edema  Neuro: demented, intermittent agitation, right sided weakness, not cooperative with physical exam  consistently  Data Reviewed: Basic Metabolic Panel:  Recent Labs Lab 03/03/15 1344 03/03/15 1410 03/04/15 0620 03/05/15 0505 03/06/15 0523  NA 141 141 141 140 141  K 3.6 3.6 3.6 4.1 3.5  CL 105 103 107 107 111  CO2 25  --  GLUCOSE 135* 131* 132* 136* 119*  BUN 44* 47* 42* 34* 24*  CREATININE 2.50* 2.50* 2.14* 1.78* 1.42*  CALCIUM 9.1  --  8.8* 8.6* 8.7*  MG  --   --  2.3  --   --    Liver Function Tests:  Recent Labs Lab 03/03/15 1344  AST 25  ALT 12*  ALKPHOS 95  BILITOT 0.8  PROT 7.0  ALBUMIN 3.6   No results for input(s): LIPASE, AMYLASE in the last 168 hours. No results for input(s): AMMONIA in the last 168 hours. CBC:  Recent Labs Lab 03/03/15 1344 03/03/15 1410 03/05/15 0743  WBC 4.3  --  4.2  HGB 13.3 15.3* 10.6*  HCT 41.5 45.0 33.3*  MCV 82.3  --  81.8  PLT 184  --  169   Cardiac Enzymes:   No results for input(s): CKTOTAL, CKMB, CKMBINDEX, TROPONINI in the last 168 hours. BNP (last 3 results) No results for input(s): BNP in the last 8760 hours.  ProBNP (last 3 results) No results for input(s): PROBNP in the last 8760 hours.  CBG:  Recent Labs Lab 03/05/15 1234 03/05/15 1729 03/05/15 2109 03/06/15 0617 03/06/15 1124  GLUCAP 91 149* 173* 126* 133*    Recent Results (from the past 240 hour(s))  Urine culture  Status: None   Collection Time: 03/03/15  1:44 PM  Result Value Ref Range Status   Specimen Description URINE, CATHETERIZED  Final   Special Requests NONE  Final   Culture MULTIPLE SPECIES PRESENT, SUGGEST RECOLLECTION  Final   Report Status 03/05/2015 FINAL  Final     Studies: Dg Pelvis 1-2 Views  03/05/2015   CLINICAL DATA:  Generalized right hip pain since yesterday.  EXAM: PELVIS - 1-2 VIEW  COMPARISON:  None.  FINDINGS: Bone mineralization is within normal limits. Moderate degenerative changes are noted at the SI joints bilaterally. Mild degenerative changes are noted in the lower lumbar spine as well. This  single view demonstrates a relatively normal appearance of the hips bilaterally. No acute fracture or dislocation is evident.  IMPRESSION: 1. Moderate degenerative changes in the SI joints and mild degenerative change in the lower lumbar spine. 2. No acute abnormality of the hips.   Electronically Signed   By: Marin Roberts M.D.   On: 03/05/2015 15:28   Dg Femur, Min 2 Views Right  03/05/2015   CLINICAL DATA:  Pain.  No history of trauma  EXAM: RIGHT FEMUR 2 VIEWS  COMPARISON:  None.  FINDINGS: Frontal and lateral views obtained. There is no fracture or dislocation. There is mild narrowing of the right hip joint. There is mild generalized joint space narrowing in the knee region with spurring along the patella. No knee joint effusion. No abnormal periosteal reaction.  IMPRESSION: Osteoarthritic change in the right knee and hip joints. No fracture or dislocation. No abnormal periosteal reaction.   Electronically Signed   By: Bretta Bang III M.D.   On: 03/05/2015 15:27    Scheduled Meds: . aspirin  300 mg Rectal Daily   Or  . aspirin  325 mg Oral Daily  . atorvastatin  40 mg Oral q1800  . diltiazem  300 mg Oral Daily  . heparin  5,000 Units Subcutaneous 3 times per day  . insulin aspart  0-9 Units Subcutaneous TID WC  . senna  1 tablet Oral BID    Continuous Infusions:     Time spent:  Samreet Edenfield MD  [facility] Pager [phone]. If 7PM-7AM, please contact night-coverage at [email or web address], password [facility] 03/06/2015, 1:20 PM  LOS: 3 days

## 2015-03-06 NOTE — Care Management Important Message (Signed)
Important Message  Patient Details  Name: Samantha Bond MRN: [id number] Date of Birth: 10-13-1926   Medicare Important Message Given:  Yes-second notification given    Orson Aloe 03/06/2015, 10:57 AM

## 2015-03-07 DIAGNOSIS — G459 Transient cerebral ischemic attack, unspecified: Principal | ICD-10-CM

## 2015-03-07 DIAGNOSIS — N179 Acute kidney failure, unspecified: Secondary | ICD-10-CM | POA: Insufficient documentation

## 2015-03-07 DIAGNOSIS — N189 Chronic kidney disease, unspecified: Secondary | ICD-10-CM

## 2015-03-07 LAB — BASIC METABOLIC PANEL
Anion gap: 8 (ref 5–15)
BUN: 23 mg/dL — ABNORMAL HIGH (ref 6–20)
CO2: 23 mmol/L (ref 22–32)
Calcium: 8.8 mg/dL — ABNORMAL LOW (ref 8.9–10.3)
Chloride: 107 mmol/L (ref 101–111)
Creatinine, Ser: 1.34 mg/dL — ABNORMAL HIGH (ref 0.44–1.00)
GFR calc non Af Amer: 35 mL/min — ABNORMAL LOW (ref >60)
GFR, EST AFRICAN AMERICAN: 40 mL/min — AB (ref >60)
GLUCOSE: 129 mg/dL — AB (ref 65–99)
Potassium: 3.6 mmol/L (ref 3.5–5.1)
SODIUM: 138 mmol/L (ref 135–145)

## 2015-03-07 LAB — GLUCOSE, CAPILLARY
Glucose-Capillary: 116 mg/dL — ABNORMAL HIGH (ref 65–99)
Glucose-Capillary: 232 mg/dL — ABNORMAL HIGH (ref 65–99)

## 2015-03-07 MED ORDER — SENNA 8.6 MG PO TABS: 1.0000 | ORAL_TABLET | Freq: Every day | ORAL | Status: AC | PRN

## 2015-03-07 MED ORDER — LORAZEPAM 0.5 MG PO TABS: 0.5000 mg | ORAL_TABLET | Freq: Three times a day (TID) | ORAL | Status: AC | PRN

## 2015-03-07 MED ORDER — HALOPERIDOL 0.5 MG PO TABS: 0.5000 mg | ORAL_TABLET | Freq: Two times a day (BID) | ORAL | Status: AC | PRN

## 2015-03-07 MED ORDER — DILTIAZEM HCL ER COATED BEADS 300 MG PO CP24: 300.0000 mg | ORAL_CAPSULE | Freq: Every day | ORAL | Status: AC

## 2015-03-07 MED ORDER — ATORVASTATIN CALCIUM 40 MG PO TABS: 40.0000 mg | ORAL_TABLET | Freq: Every day | ORAL | Status: AC

## 2015-03-07 MED ORDER — LOSARTAN POTASSIUM 50 MG PO TABS: 50.0000 mg | ORAL_TABLET | Freq: Every day | ORAL | Status: DC

## 2015-03-07 MED ORDER — ASPIRIN 325 MG PO TABS: 325.0000 mg | ORAL_TABLET | Freq: Every day | ORAL | Status: AC

## 2015-03-07 NOTE — Progress Notes (Signed)
Patient d/c to SNF this afternoon, assessments remained unchanged prior to discharge.

## 2015-03-07 NOTE — Clinical Social Work Placement (Signed)
   CLINICAL SOCIAL WORK PLACEMENT  NOTE  Date:  03/07/2015  Patient Details  Name: Samantha Bond MRN: [id number] Date of Birth: 1927-06-29  Clinical Social Work is seeking post-discharge placement for this patient at the Skilled  Nursing Facility level of care (*CSW will initial, date and re-position this form in  chart as items are completed):  Yes   Patient/family provided with [facility] Clinical Social Work Department's list of facilities offering this level of care within the geographic area requested by the patient (or if unable, by the patient's family).  Yes   Patient/family informed of their freedom to choose among providers that offer the needed level of care, that participate in Medicare, Medicaid or managed care program needed by the patient, have an available bed and are willing to accept the patient.  Yes   Patient/family informed of [facility]'s ownership interest in [facility] and [facility], as well as of the fact that they are under no obligation to receive care at these facilities.  PASRR submitted to EDS on       PASRR number received on 03/05/15     Existing PASRR number confirmed on 03/05/15     FL2 transmitted to all facilities in geographic area requested by pt/family on 03/05/15     FL2 transmitted to all facilities within larger geographic area on       Patient informed that his/her managed care company has contracts with or will negotiate with certain facilities, including the following:   (YES)     Yes   Patient/family informed of bed offers received.  Patient chooses bed at  [facility] )     Physician recommends and patient chooses bed at      Patient to be transferred to  [facility] ) on 03/07/15.  Patient to be transferred to facility by  [facility] )     Patient family notified on 03/07/15 of transfer.  Name of family member notified:   (Pt's dtr, Ludwig Lean )     PHYSICIAN Please prepare  prescriptions     Additional Comment:    _______________________________________________ Derenda Fennel, MSW, LCSWA [phone] 03/07/2015 11:39 AM

## 2015-03-07 NOTE — Clinical Social Work Note (Signed)
Currently awaiting [facility] authorization.  Clinical Social Worker facilitated patient discharge including contacting patient family and facility to confirm patient discharge plans.  Clinical information faxed to facility and family agreeable with plan.  CSW arranged ambulance transport via [facility] to [facility].  RN to call report prior to discharge.  DC packet prepared and on chart for transport with number for report.   Clinical Social Worker will sign off for now as social work intervention is no longer needed. Please consult [place] again if new need arises.  Derenda Fennel, MSW, LCSWA [phone] 03/07/2015 11:40 AM

## 2015-03-07 NOTE — Discharge Summary (Signed)
Physician Discharge Summary  Samantha Bond ZOX:[id number] DOB: 07-Dec-1926 DOA: 03/03/2015  PCP: Pearla Dubonnet, MD  Admit date: 03/03/2015 Discharge date: 03/07/2015  Time spent: 45 minutes  Recommendations for Outpatient Follow-up:  1. PCP Dr.Gates in 1 week 2. Dr.Sethi in 1 month  Discharge Diagnoses:  Principal Problem:   TIA   Decreased responsiveness Active Problems:   DM (diabetes mellitus)   Acute kidney injury   HTN (hypertension)   Dementia   Confusion   Right hip pain   TIA (transient ischemic attack)   Renal failure (ARF), acute on chronic   Discharge Condition: stable  Diet recommendation: Dysphagia 3 diet with thin liquids  There were no vitals filed for this visit.  History of present illness:  Chief complaint: Altered Mental Status   HPI: Samantha Bond is a 81 y.o. female, with dementia, hypertension, diabetes mellitus with peripheral neuropathy.She was brought to the emergency department from home with presumed stroke. Her son sleeps at the patient's house at night, and her daughter brings her lunch during the day. The patient was last seen normal by her son at 7 AM when he left for work. She was sitting on the couch in the living room. Her daughter found her at 1 PM slumped over on the couch laying in emesis and urine. Her mother had decreased responsiveness, she was unable to walk, had facial droop with right-sided weakness     Hospital Course:  Right-sided weakness and Facial droop -resolved, negative for infarct by MRI, suspect TIA vs seizure -EEG normal -started on ASA 325mg  -followed by NEurology Dr.Sethi -LDL 178, lipitor added, HgbA1c 6.6 -ECHO with normal EF and grade 1 DD -carotid duplex: Bilateral: 1-39% ICA stenosis. Vertebral artery flow is antegrade -Speech eval completed, D3 diet recommended -Pt and OT eval completed, SNf recommended  Acute kidney injury Baseline creatinine is 1.08 (03/2014). Creatinine is 2.5 on  03/03/15 Hold ARB, NSAIDs (naproxen) and HCTZ. -improved with hydration, creatinine 1.3 at discharge  Diabetes mellitus -stable, SSI used, not on meds at home  Hypertension Continue  diltiazem.  ARB and HCTZ held since admission due to acute kidney injury. BP has been stable, could resume HCTZ upon FU depending on BP  Dementia -with confusion at baseline, oriented to self only Continue Namenda. Continue Haldol when necessary at decreased dose.  Ativan PRN per home regimen  Right hip pain: - hip /pelvic xray revealed no fracture. Degenerative changes and osteoarthritis.  -tylenol PRN  Consultations:  Neurology  Discharge Exam: Filed Vitals:   03/07/15 1013  BP: 180/86  Pulse: 67  Temp: 98 F (36.7 C)  Resp: 20    General: AAO to self only, pleasantly confused Cardiovascular: S1S2/RRR Respiratory: CTAB  Discharge Instructions   Discharge Instructions    Discharge instructions    Complete by:  As directed   Dysphagia 3 diet with thin liquids     Increase activity slowly    Complete by:  As directed           Current Discharge Medication List    START taking these medications   Details  aspirin 325 MG tablet Take 1 tablet (325 mg total) by mouth daily.    atorvastatin (LIPITOR) 40 MG tablet Take 1 tablet (40 mg total) by mouth daily at 6 PM.      CONTINUE these medications which have CHANGED   Details  diltiazem (CARDIZEM CD) 300 MG 24 hr capsule Take 1 capsule (300 mg total) by mouth daily.  haloperidol (HALDOL) 0.5 MG tablet Take 1 tablet (0.5 mg total) by mouth 2 (two) times daily as needed for agitation. Qty: 20 tablet, Refills: 0    LORazepam (ATIVAN) 0.5 MG tablet Take 1 tablet (0.5 mg total) by mouth every 8 (eight) hours as needed for anxiety (for agitation). Qty: 30 tablet, Refills: 0    senna (SENOKOT) 8.6 MG TABS tablet Take 1 tablet (8.6 mg total) by mouth daily as needed for mild constipation. Refills: 0      STOP taking these  medications     naproxen sodium (ANAPROX) 220 MG tablet        Allergies  Allergen Reactions  . Lyrica [Pregabalin] Other (See Comments)    Lethargy & blurred vision  . Metformin And Related Diarrhea and Other (See Comments)    Elevated pancreatic enzymes  . Toprol Xl [Metoprolol] Other (See Comments)    Asthma  . Benicar [Olmesartan] Other (See Comments)    arthralgias  . Vicodin [Hydrocodone-Acetaminophen] Nausea Only  . Penicillins Other (See Comments)    unknown   Follow-up Information    Follow up with GATES,ROBERT NEVILL, MD. Schedule an appointment as soon as possible for a visit in 1 week.   Specialty:  Internal Medicine   Contact information:   [address] [place] [address] [phone]        The results of significant diagnostics from this hospitalization (including imaging, microbiology, ancillary and laboratory) are listed below for reference.    Significant Diagnostic Studies: Dg Pelvis 1-2 Views  03/05/2015   CLINICAL DATA:  Generalized right hip pain since yesterday.  EXAM: PELVIS - 1-2 VIEW  COMPARISON:  None.  FINDINGS: Bone mineralization is within normal limits. Moderate degenerative changes are noted at the SI joints bilaterally. Mild degenerative changes are noted in the lower lumbar spine as well. This single view demonstrates a relatively normal appearance of the hips bilaterally. No acute fracture or dislocation is evident.  IMPRESSION: 1. Moderate degenerative changes in the SI joints and mild degenerative change in the lower lumbar spine. 2. No acute abnormality of the hips.   Electronically Signed   By: Marin Roberts M.D.   On: 03/05/2015 15:28   Ct Head Wo Contrast  03/03/2015   CLINICAL DATA:  Right-sided facial droop  EXAM: CT HEAD WITHOUT CONTRAST  TECHNIQUE: Contiguous axial images were obtained from the base of the skull through the vertex without intravenous contrast.  COMPARISON:  11/09/2013  FINDINGS: Global  atrophy. Chronic ischemic changes in the periventricular white matter. Lacunar infarcts in the basal ganglia are noted. There is no mass effect, midline shift, or acute hemorrhage.  IMPRESSION: No acute intracranial pathology.   Electronically Signed   By: Jolaine Click M.D.   On: 03/03/2015 15:34   Mr Brain Wo Contrast  03/04/2015   CLINICAL DATA:  Stroke.  Altered mental status  EXAM: MRI HEAD WITHOUT CONTRAST  MRA HEAD WITHOUT CONTRAST  TECHNIQUE: Multiplanar, multiecho pulse sequences of the brain and surrounding structures were obtained without intravenous contrast. Angiographic images of the head were obtained using MRA technique without contrast.  COMPARISON:  CT head 03/03/2015, MRI 11/07/2013  FINDINGS: MRI HEAD FINDINGS  Image quality degraded by motion  Moderate to advanced atrophy. Ventricular enlargement consistent with atrophy  Negative for acute infarct. Moderate chronic microvascular ischemic change throughout the white matter. Chronic ischemia in the thalamus bilaterally. Brainstem intact.  Negative for mass or edema.  Scattered areas of micro hemorrhage in  the brain bilaterally. Small amount of hemorrhage and chronic left parietal infarct.  MRA HEAD FINDINGS  MRA significantly degraded by motion  Both vertebral arteries patent to the basilar. Moderate stenosis mid basilar. Superior cerebellar arteries patent bilaterally. Moderate disease in the posterior cerebral arteries bilaterally  Moderate stenosis left cavernous carotid. Moderate to severe focal stenosis supra clinoid internal carotid artery on the right. Anterior and middle cerebral arteries are patent bilaterally. Irregularity of the middle cerebral arteries bilaterally due to atherosclerotic disease. Anterior cerebral arteries not well evaluated due to motion.  IMPRESSION: Atrophy and chronic microvascular ischemia.  No acute infarct.  Scattered areas of chronic micro hemorrhage the brain likely related to chronic hypertension  MRA is  significantly degraded by motion. Moderate intracranial atherosclerotic disease as above.   Electronically Signed   By: Marlan Palau M.D.   On: 03/04/2015 15:00   Dg Chest Port 1 View  03/03/2015   CLINICAL DATA:  Patient combative. Altered level of consciousness. Confusion.  EXAM: PORTABLE CHEST - 1 VIEW  COMPARISON:  03/17/2014.  FINDINGS: The film is rotated but best obtainable given the patient's condition. Cardiomediastinal silhouette grossly normal. No infiltrates or edema. No osseous findings.  IMPRESSION: No active cardiopulmonary disease is evident on this rotated radiograph. No change from priors.   Electronically Signed   By: Elsie Stain M.D.   On: 03/03/2015 14:13   Mr Maxine Glenn Head/brain Wo Cm  03/04/2015   CLINICAL DATA:  Stroke.  Altered mental status  EXAM: MRI HEAD WITHOUT CONTRAST  MRA HEAD WITHOUT CONTRAST  TECHNIQUE: Multiplanar, multiecho pulse sequences of the brain and surrounding structures were obtained without intravenous contrast. Angiographic images of the head were obtained using MRA technique without contrast.  COMPARISON:  CT head 03/03/2015, MRI 11/07/2013  FINDINGS: MRI HEAD FINDINGS  Image quality degraded by motion  Moderate to advanced atrophy. Ventricular enlargement consistent with atrophy  Negative for acute infarct. Moderate chronic microvascular ischemic change throughout the white matter. Chronic ischemia in the thalamus bilaterally. Brainstem intact.  Negative for mass or edema.  Scattered areas of micro hemorrhage in the brain bilaterally. Small amount of hemorrhage and chronic left parietal infarct.  MRA HEAD FINDINGS  MRA significantly degraded by motion  Both vertebral arteries patent to the basilar. Moderate stenosis mid basilar. Superior cerebellar arteries patent bilaterally. Moderate disease in the posterior cerebral arteries bilaterally  Moderate stenosis left cavernous carotid. Moderate to severe focal stenosis supra clinoid internal carotid artery on the  right. Anterior and middle cerebral arteries are patent bilaterally. Irregularity of the middle cerebral arteries bilaterally due to atherosclerotic disease. Anterior cerebral arteries not well evaluated due to motion.  IMPRESSION: Atrophy and chronic microvascular ischemia.  No acute infarct.  Scattered areas of chronic micro hemorrhage the brain likely related to chronic hypertension  MRA is significantly degraded by motion. Moderate intracranial atherosclerotic disease as above.   Electronically Signed   By: Marlan Palau M.D.   On: 03/04/2015 15:00   Dg Femur, Min 2 Views Right  03/05/2015   CLINICAL DATA:  Pain.  No history of trauma  EXAM: RIGHT FEMUR 2 VIEWS  COMPARISON:  None.  FINDINGS: Frontal and lateral views obtained. There is no fracture or dislocation. There is mild narrowing of the right hip joint. There is mild generalized joint space narrowing in the knee region with spurring along the patella. No knee joint effusion. No abnormal periosteal reaction.  IMPRESSION: Osteoarthritic change in the right knee and hip joints. No fracture or  dislocation. No abnormal periosteal reaction.   Electronically Signed   By: Bretta Bang III M.D.   On: 03/05/2015 15:27    Microbiology: Recent Results (from the past 240 hour(s))  Urine culture     Status: None   Collection Time: 03/03/15  1:44 PM  Result Value Ref Range Status   Specimen Description URINE, CATHETERIZED  Final   Special Requests NONE  Final   Culture MULTIPLE SPECIES PRESENT, SUGGEST RECOLLECTION  Final   Report Status 03/05/2015 FINAL  Final     Labs: Basic Metabolic Panel:  Recent Labs Lab 03/03/15 1344 03/03/15 1410 03/04/15 0620 03/05/15 0505 03/06/15 0523 03/07/15 0309  NA 141 141 141 140 141 138  K 3.6 3.6 3.6 4.1 3.5 3.6  CL 105 103 107 107 111 107  CO2 25  --  GLUCOSE 135* 131* 132* 136* 119* 129*  BUN 44* 47* 42* 34* 24* 23*  CREATININE 2.50* 2.50* 2.14* 1.78* 1.42* 1.34*  CALCIUM 9.1  --   8.8* 8.6* 8.7* 8.8*  MG  --   --  2.3  --   --   --    Liver Function Tests:  Recent Labs Lab 03/03/15 1344  AST 25  ALT 12*  ALKPHOS 95  BILITOT 0.8  PROT 7.0  ALBUMIN 3.6   No results for input(s): LIPASE, AMYLASE in the last 168 hours. No results for input(s): AMMONIA in the last 168 hours. CBC:  Recent Labs Lab 03/03/15 1344 03/03/15 1410 03/05/15 0743  WBC 4.3  --  4.2  HGB 13.3 15.3* 10.6*  HCT 41.5 45.0 33.3*  MCV 82.3  --  81.8  PLT 184  --  169   Cardiac Enzymes: No results for input(s): CKTOTAL, CKMB, CKMBINDEX, TROPONINI in the last 168 hours. BNP: BNP (last 3 results) No results for input(s): BNP in the last 8760 hours.  ProBNP (last 3 results) No results for input(s): PROBNP in the last 8760 hours.  CBG:  Recent Labs Lab 03/06/15 0617 03/06/15 1124 03/06/15 1627 03/06/15 2108 03/07/15 0700  GLUCAP 126* 133* 101* 138* 116*       Signed:  Charleene Callegari  [facility] 03/07/2015, 10:25 AM

## 2015-03-07 NOTE — Clinical Social Work Note (Signed)
[employer] authorization obtained: [id number].  Derenda Fennel, MSW, LCSWA [phone] 03/07/2015 12:32 PM

## 2015-03-28 ENCOUNTER — Inpatient Hospital Stay (HOSPITAL_COMMUNITY)
Admission: EM | Admit: 2015-03-28 | Discharge: 2015-04-10 | DRG: 061 | Disposition: E | Payer: Medicare Other | Attending: Neurology | Admitting: Neurology

## 2015-03-28 ENCOUNTER — Emergency Department (HOSPITAL_COMMUNITY): Payer: Medicare Other

## 2015-03-28 ENCOUNTER — Inpatient Hospital Stay (HOSPITAL_COMMUNITY): Payer: Medicare Other

## 2015-03-28 ENCOUNTER — Encounter (HOSPITAL_COMMUNITY): Payer: Self-pay | Admitting: Emergency Medicine

## 2015-03-28 DIAGNOSIS — E1140 Type 2 diabetes mellitus with diabetic neuropathy, unspecified: Secondary | ICD-10-CM | POA: Diagnosis present

## 2015-03-28 DIAGNOSIS — Z4659 Encounter for fitting and adjustment of other gastrointestinal appliance and device: Secondary | ICD-10-CM

## 2015-03-28 DIAGNOSIS — Z8673 Personal history of transient ischemic attack (TIA), and cerebral infarction without residual deficits: Secondary | ICD-10-CM | POA: Diagnosis not present

## 2015-03-28 DIAGNOSIS — N183 Chronic kidney disease, stage 3 (moderate): Secondary | ICD-10-CM | POA: Diagnosis present

## 2015-03-28 DIAGNOSIS — Z66 Do not resuscitate: Secondary | ICD-10-CM | POA: Diagnosis present

## 2015-03-28 DIAGNOSIS — G936 Cerebral edema: Secondary | ICD-10-CM | POA: Diagnosis present

## 2015-03-28 DIAGNOSIS — N189 Chronic kidney disease, unspecified: Secondary | ICD-10-CM | POA: Diagnosis present

## 2015-03-28 DIAGNOSIS — E1122 Type 2 diabetes mellitus with diabetic chronic kidney disease: Secondary | ICD-10-CM | POA: Diagnosis present

## 2015-03-28 DIAGNOSIS — I610 Nontraumatic intracerebral hemorrhage in hemisphere, subcortical: Secondary | ICD-10-CM | POA: Diagnosis not present

## 2015-03-28 DIAGNOSIS — M1990 Unspecified osteoarthritis, unspecified site: Secondary | ICD-10-CM | POA: Diagnosis present

## 2015-03-28 DIAGNOSIS — I639 Cerebral infarction, unspecified: Secondary | ICD-10-CM | POA: Diagnosis present

## 2015-03-28 DIAGNOSIS — R4701 Aphasia: Secondary | ICD-10-CM | POA: Diagnosis present

## 2015-03-28 DIAGNOSIS — J9600 Acute respiratory failure, unspecified whether with hypoxia or hypercapnia: Secondary | ICD-10-CM | POA: Diagnosis not present

## 2015-03-28 DIAGNOSIS — Z515 Encounter for palliative care: Secondary | ICD-10-CM | POA: Diagnosis not present

## 2015-03-28 DIAGNOSIS — I615 Nontraumatic intracerebral hemorrhage, intraventricular: Secondary | ICD-10-CM | POA: Diagnosis not present

## 2015-03-28 DIAGNOSIS — I613 Nontraumatic intracerebral hemorrhage in brain stem: Secondary | ICD-10-CM | POA: Diagnosis not present

## 2015-03-28 DIAGNOSIS — R4182 Altered mental status, unspecified: Secondary | ICD-10-CM

## 2015-03-28 DIAGNOSIS — F0390 Unspecified dementia without behavioral disturbance: Secondary | ICD-10-CM | POA: Diagnosis present

## 2015-03-28 DIAGNOSIS — Z7982 Long term (current) use of aspirin: Secondary | ICD-10-CM

## 2015-03-28 DIAGNOSIS — I63312 Cerebral infarction due to thrombosis of left middle cerebral artery: Secondary | ICD-10-CM | POA: Diagnosis not present

## 2015-03-28 DIAGNOSIS — I10 Essential (primary) hypertension: Secondary | ICD-10-CM | POA: Diagnosis present

## 2015-03-28 DIAGNOSIS — I071 Rheumatic tricuspid insufficiency: Secondary | ICD-10-CM | POA: Diagnosis present

## 2015-03-28 DIAGNOSIS — I129 Hypertensive chronic kidney disease with stage 1 through stage 4 chronic kidney disease, or unspecified chronic kidney disease: Secondary | ICD-10-CM | POA: Diagnosis present

## 2015-03-28 DIAGNOSIS — G8191 Hemiplegia, unspecified affecting right dominant side: Secondary | ICD-10-CM | POA: Diagnosis present

## 2015-03-28 DIAGNOSIS — R4020 Unspecified coma: Secondary | ICD-10-CM | POA: Diagnosis not present

## 2015-03-28 DIAGNOSIS — G935 Compression of brain: Secondary | ICD-10-CM | POA: Diagnosis present

## 2015-03-28 DIAGNOSIS — G911 Obstructive hydrocephalus: Secondary | ICD-10-CM | POA: Diagnosis present

## 2015-03-28 DIAGNOSIS — J9601 Acute respiratory failure with hypoxia: Secondary | ICD-10-CM | POA: Diagnosis not present

## 2015-03-28 DIAGNOSIS — R001 Bradycardia, unspecified: Secondary | ICD-10-CM | POA: Diagnosis present

## 2015-03-28 DIAGNOSIS — I619 Nontraumatic intracerebral hemorrhage, unspecified: Secondary | ICD-10-CM

## 2015-03-28 DIAGNOSIS — Z978 Presence of other specified devices: Secondary | ICD-10-CM

## 2015-03-28 LAB — COMPREHENSIVE METABOLIC PANEL
ALT: 18 U/L (ref 14–54)
AST: 28 U/L (ref 15–41)
Albumin: 3.5 g/dL (ref 3.5–5.0)
Alkaline Phosphatase: 96 U/L (ref 38–126)
Anion gap: 12 (ref 5–15)
BILIRUBIN TOTAL: 0.7 mg/dL (ref 0.3–1.2)
BUN: 24 mg/dL — AB (ref 6–20)
CO2: 25 mmol/L (ref 22–32)
Calcium: 9.3 mg/dL (ref 8.9–10.3)
Chloride: 103 mmol/L (ref 101–111)
Creatinine, Ser: 1.31 mg/dL — ABNORMAL HIGH (ref 0.44–1.00)
GFR calc Af Amer: 41 mL/min — ABNORMAL LOW (ref >60)
GFR calc non Af Amer: 36 mL/min — ABNORMAL LOW (ref >60)
Glucose, Bld: 150 mg/dL — ABNORMAL HIGH (ref 65–99)
Potassium: 3.7 mmol/L (ref 3.5–5.1)
Sodium: 140 mmol/L (ref 135–145)
TOTAL PROTEIN: 6.9 g/dL (ref 6.5–8.1)

## 2015-03-28 LAB — URINALYSIS, ROUTINE W REFLEX MICROSCOPIC
Glucose, UA: NEGATIVE mg/dL
Ketones, ur: NEGATIVE mg/dL
Nitrite: NEGATIVE
PH: 5.5 (ref 5.0–8.0)
Protein, ur: 30 mg/dL — AB
SPECIFIC GRAVITY, URINE: 1.023 (ref 1.005–1.030)
Urobilinogen, UA: 1 mg/dL (ref 0.0–1.0)

## 2015-03-28 LAB — CBC WITH DIFFERENTIAL/PLATELET
BASOS ABS: 0 10*3/uL (ref 0.0–0.1)
BASOS PCT: 0 % (ref 0–1)
Eosinophils Absolute: 0.1 10*3/uL (ref 0.0–0.7)
Eosinophils Relative: 3 % (ref 0–5)
HEMATOCRIT: 36.1 % (ref 36.0–46.0)
HEMOGLOBIN: 11.5 g/dL — AB (ref 12.0–15.0)
Lymphocytes Relative: 32 % (ref 12–46)
Lymphs Abs: 1.5 10*3/uL (ref 0.7–4.0)
MCH: 26.3 pg (ref 26.0–34.0)
MCHC: 31.9 g/dL (ref 30.0–36.0)
MCV: 82.6 fL (ref 78.0–100.0)
MONOS PCT: 11 % (ref 3–12)
Monocytes Absolute: 0.5 10*3/uL (ref 0.1–1.0)
NEUTROS ABS: 2.6 10*3/uL (ref 1.7–7.7)
NEUTROS PCT: 54 % (ref 43–77)
Platelets: 232 10*3/uL (ref 150–400)
RBC: 4.37 MIL/uL (ref 3.87–5.11)
RDW: 15 % (ref 11.5–15.5)
WBC: 4.7 10*3/uL (ref 4.0–10.5)

## 2015-03-28 LAB — URINE MICROSCOPIC-ADD ON

## 2015-03-28 LAB — I-STAT CG4 LACTIC ACID, ED: Lactic Acid, Venous: 1.39 mmol/L (ref 0.5–2.0)

## 2015-03-28 LAB — PROTIME-INR
INR: 1.03 (ref 0.00–1.49)
Prothrombin Time: 13.8 seconds (ref 11.6–15.2)

## 2015-03-28 LAB — APTT: aPTT: 24 seconds (ref 24–37)

## 2015-03-28 MED ORDER — STROKE: EARLY STAGES OF RECOVERY BOOK
Freq: Once | Status: DC
  Filled 2015-03-28: qty 1

## 2015-03-28 MED ORDER — SODIUM CHLORIDE 0.9 % IV SOLN
INTRAVENOUS | Status: DC
Start: 2015-03-28 — End: 2015-03-29
  Administered 2015-03-28: 20:00:00 via INTRAVENOUS

## 2015-03-28 MED ORDER — ACETAMINOPHEN 650 MG RE SUPP: 650.0000 mg | RECTAL | Status: DC | PRN

## 2015-03-28 MED ORDER — IPRATROPIUM-ALBUTEROL 0.5-2.5 (3) MG/3ML IN SOLN
3.0000 mL | Freq: Once | RESPIRATORY_TRACT | Status: AC
  Administered 2015-03-28: 3 mL via RESPIRATORY_TRACT
  Filled 2015-03-28: qty 3

## 2015-03-28 MED ORDER — PANTOPRAZOLE SODIUM 40 MG IV SOLR
40.0000 mg | Freq: Every day | INTRAVENOUS | Status: DC
  Administered 2015-03-29: 40 mg via INTRAVENOUS
  Filled 2015-03-28 (×2): qty 40

## 2015-03-28 MED ORDER — NICARDIPINE HCL IN NACL 20-0.86 MG/200ML-% IV SOLN
INTRAVENOUS | Status: AC
  Filled 2015-03-28: qty 200

## 2015-03-28 MED ORDER — IOHEXOL 350 MG/ML SOLN
50.0000 mL | Freq: Once | INTRAVENOUS | Status: AC | PRN
  Administered 2015-03-28: 50 mL via INTRAVENOUS

## 2015-03-28 MED ORDER — ACETAMINOPHEN 325 MG PO TABS: 650.0000 mg | ORAL_TABLET | ORAL | Status: DC | PRN

## 2015-03-28 MED ORDER — SENNOSIDES-DOCUSATE SODIUM 8.6-50 MG PO TABS
1.0000 | ORAL_TABLET | Freq: Every evening | ORAL | Status: DC | PRN
  Filled 2015-03-28: qty 1

## 2015-03-28 MED ORDER — ALBUTEROL (5 MG/ML) CONTINUOUS INHALATION SOLN
10.0000 mg/h | INHALATION_SOLUTION | Freq: Once | RESPIRATORY_TRACT | Status: AC
  Administered 2015-03-28: 10 mg/h via RESPIRATORY_TRACT
  Filled 2015-03-28: qty 20

## 2015-03-28 MED ORDER — NICARDIPINE HCL IN NACL 20-0.86 MG/200ML-% IV SOLN
INTRAVENOUS | Status: AC
Start: 2015-03-28 — End: 2015-03-29
  Filled 2015-03-28: qty 200

## 2015-03-28 MED ORDER — IPRATROPIUM-ALBUTEROL 0.5-2.5 (3) MG/3ML IN SOLN: 3.0000 mL | RESPIRATORY_TRACT | Status: DC | PRN

## 2015-03-28 MED ORDER — ALTEPLASE (STROKE) FULL DOSE INFUSION
0.9000 mg/kg | Freq: Once | INTRAVENOUS | Status: AC
  Administered 2015-03-28: 72 mg via INTRAVENOUS
  Filled 2015-03-28: qty 72

## 2015-03-28 MED ORDER — LABETALOL HCL 5 MG/ML IV SOLN: 10.0000 mg | INTRAVENOUS | Status: DC | PRN

## 2015-03-28 NOTE — ED Notes (Signed)
This RN and rapid response RN in with pt to hang TPA.

## 2015-03-28 NOTE — ED Notes (Signed)
Neurologist calling to see if we can get pt to MRI.

## 2015-03-28 NOTE — ED Notes (Signed)
Phlebotomy at bedside.

## 2015-03-28 NOTE — Progress Notes (Addendum)
Called by patient nurse regarding acute change in mental status. Ordered STAT CT brain that revealed a vague 2 x 1.2 cm density within the LEFT pons, which may represent hemorrhage or enhancement giving very recent contrast administration for CTA.   Patient is also with SBP>220, no protecting her airway. Critical care consult called. Started IV nicardipine. I spoke over the phone with patient daughter and informed her about the new neurological developments and critical condition of her mother. Will follow up closely tonight.  Wyatt Portela ,MD [facility]

## 2015-03-28 NOTE — Progress Notes (Addendum)
Code Stroke called on 81 y.o female. Per Pt family she was d/c'd from [facility] today. She took a nap and woke up vomiting this afternoon EMS called to Pt's home. Per ED RN Carollee Sires upon arrival to ED at 1635 her speech was clear and she had ZERO neuro deficits excepts for mild confusion which is her baseline. Pt last seen normal at 1830 in the ED, found at 1900 with acute worsening in confusion, garbled speech and aphasia. Pt taken to CT scan STAT. NIHSS completed at 1920 scored 10 for confusion, weakness in bilateral legs, right more than left and severe aphasia and dysarthria. Pt taken to MRI at 1930  and it was decided per Neurologist Dr. Cyril Mourning to give TPa based on positive MRI findings. Pt taken back to ED room, TPA delayed for second PIV start and FC insertion. TPA started at 2025. Pt taken for CTA at 2045. Second neuro assessment completed upon arrival back to ED room at 2120.  Pt with no weakness assessed in lower extremities at that time. Minimal bleed assessed in oral cavity, Dr. Cyril Mourning made aware. Pt for admit to 3 M Neuro ICU.

## 2015-03-28 NOTE — ED Notes (Signed)
Pt transported to CT with this RN and Nehemiah Settle RR RN

## 2015-03-28 NOTE — ED Provider Notes (Signed)
History   Chief Complaint  Patient presents with  . Loss of Consciousness  . Aspiration    HPI 81 year old female past history of dementia, diabetes, recent admission for TIA who was discharged to a rehabilitation facility where she was actually discharged from today but then subsequently had altered mental status at home. EMS was called out to patient's home where she was found to be somewhat unresponsive with a right-sided facial droop. Family stated that was the same symptoms patient had prior to her TIA in July. EMS reports the patient had elevated blood sugar, was 180 systolic and appeared to have vomited on herself. Per EMS report, family noted that the patient laid down to sleep this AM and subsequently awoke vomiting and was altered. EMS reports patient had coarse breath sounds bilaterally but was satting well on 2 L nasal cannula. No report of any recent fevers, prior illness, pain. Patient is normally able to hold conversation and is unable to do so today per EMS. The history is limited at this time sedated patient condition in fact that no family is present.  Past medical/surgical history, social history, medications, allergies and FH have been reviewed with patient and/or in documentation. Furthermore, if pt family or friend(s) present, additional historical information was obtained from them.  Past Medical History  Diagnosis Date  . Hypertension   . Diabetes mellitus   . DJD (degenerative joint disease)   . Peripheral neuropathy   . Mild aortic sclerosis   . Spinal stenosis   . Diverticulosis   . DJD (degenerative joint disease)     knees, back & cervical   Past Surgical History  Procedure Laterality Date  . Cholecystectomy    . Esophagogastroduodenoscopy Left 11/15/2013    Procedure: ESOPHAGOGASTRODUODENOSCOPY (EGD);  Surgeon: Willis Modena, MD;  Location: [facility] ENDOSCOPY;  Service: Endoscopy;  Laterality: Left;   Family History  Problem Relation Age of Onset  .  Hyperlipidemia Mother    Social History  Substance Use Topics  . Smoking status: Never Smoker   . Smokeless tobacco: Not on file  . Alcohol Use: No     Review of Systems Unable to obtain 2/2 pt condition and dementia  Physical Exam  Physical Exam  ED Triage Vitals  Enc Vitals Group     BP --      Pulse --      Resp --      Temp --      Temp src --      SpO2 04/03/2015 1624 98 %     Weight --      Height --      Head Cir --      Peak Flow --      Pain Score --      Pain Loc --      Pain Edu? --      Excl. in GC? --    Constitutional: Chronically ill-appearing elderly female who is not conversant but is following some simple commands and is otherwise in no apparent distress. Head: Normocephalic and atraumatic.  Eyes: Extraocular motion intact, no scleral icterus Mouth: MMM, OP clear Neck: Supple without meningismus, mass, or overt JVD Respiratory: Patient is coughing repeatedly on exam but otherwise without increased work of breathing. She does have wheezing bilaterally and crackles at her bases. CV: RRR, no obvious murmurs.  Pulses +2 and symmetric. Euvolemic Abdomen: Soft, NT, ND, no r/g. No mass.  MSK: Extremities are atraumatic without deformity, ROM intact. Skin: Warm, dry,  intact without rash Neuro: Patient is awake, not conversant. She is following simple commands. Moving all extremities. HDS, PERRL, EOMI, TML, face sym. CN 2-12 grossly intact. 5/5 sym, no drift, normal coordination.    ED Course  Procedures   Labs Reviewed  CBC WITH DIFFERENTIAL/PLATELET - Abnormal; Notable for the following:    Hemoglobin 11.5 (*)    All other components within normal limits  COMPREHENSIVE METABOLIC PANEL - Abnormal; Notable for the following:    Glucose, Bld 150 (*)    BUN 24 (*)    Creatinine, Ser 1.31 (*)    GFR calc non Af Amer 36 (*)    GFR calc Af Amer 41 (*)    All other components[address] limits  URINALYSIS, ROUTINE63 [place] [id number] day.  FINDINGS: CT HEAD  Again seen is atrophy and chronic small vessel disease throughout the brain. No sign of acute infarction, mass lesion, hemorrhage, hydrocephalus or extra-axial collection.  CTA HEAD  Anterior circulation: Both internal carotid arteries are widely patent through the skullbase. There is atherosclerotic calcification in the carotid siphon regions but no stenosis more than about 20%. The anterior and middle cerebral vessels are patent bilaterally without proximal stenosis, aneurysm or vascular malformation. The upper cervical internal carotid artery on the right is ectatic. Distal intracranial branches show irregularity.  Posterior circulation: Both vertebral arteries are widely patent to the basilar. There is focal stenosis in the mid basilar between the anterior inferior cerebellar arteries and the superior cerebellar arteries. Luminal  diameter is 1 mm. The stenosis is 70% by approximation. Superior cerebellar and posterior cerebral arteries are patent. More distal branch vessels do show atherosclerotic irregularity.  Venous sinuses: Patent and normal  Anatomic variants: None significant  Delayed phase:No abnormal enhancement  IMPRESSION: No intracranial large or medium vessel occlusion. No anterior circulation stenosis.  Mid basilar stenosis, approximately 70% with luminal diameter 1 mm.  Diffuse intracranial small vessel irregularity.   Electronically Signed   By: Mark  Shogry M.D.   On: 03/25/2015 21:26   Ct Head Wo Contrast  03/20/2015   CLINICAL DATA:  Awoke from nap today vomiting, and became unresponsive for 30 minutes. Altered mental status with possible RIGHT facial droop. History of hypertension, diabetes, TIA.  EXAM: CT HEAD WITHOUT CONTRAST  TECHNIQUE:  Contiguous axial images were obtained from the base of the skull through the vertex without intravenous contrast.  COMPARISON:  Limited MRI of the brain 2015-04-20 at 1942 hours and CTA of the head 2015-04-20 at 2104 hours and MRI of the brain March 04, 2015.  FINDINGS: New density within the LEFT pons extending to the brachium pontis without mass effect or edema, this ill-defined area measures approximately 2 x 1.2 cm. No additional areas of abnormal intraparenchymal density. No midline shift or mass effect. Moderate to severe ventriculomegaly, likely on the basis of global parenchymal brain volume loss as there is overall commensurate enlargement of cerebral sulci and cerebellar folia. Smaller of apparent RIGHT frontal vertex encephalomalacia. Confluent supratentorial white matter hypodensities. Old bilateral thalamus and bilateral basal ganglia lacunar infarcts. No acute large vascular territory infarct.  No abnormal extra-axial fluid collections. Basal cisterns are patent.  Visualized paranasal sinuses and mastoid air cells are well aerated. No skull fracture. Ocular  globes and orbital contents are normal.  IMPRESSION: Vague 2 x 1.2 cm density within the LEFT pons, which may represent hemorrhage or enhancement (possibly subacute infarct) giving contrast administration approximately ago.  Stable chronic changes including severe chronic small vessel ischemic disease and old bilateral basal ganglia and thalamus lacunar infarcts.  Preliminary findings discussed by Dr. Cherly Hensen with Dr. Leroy Kennedy on 04-20-2015 at approximately 2340 hours.   Electronically Signed   By: Awilda Metro M.D.   On: 2015-04-20 23:56   Ct Head Wo Contrast  2015/04/20   CLINICAL DATA:  Code stroke. New onset of expressive aphasia. Syncope and vomiting. Initial encounter.  EXAM: CT HEAD WITHOUT CONTRAST  TECHNIQUE: Contiguous axial images were obtained from the base of the skull through the vertex without intravenous contrast.  COMPARISON:  CT of the head performed 03/03/2015, and MRI/MRA of the brain performed 03/04/2015  FINDINGS: There is no evidence of acute infarction, mass lesion, or intra- or extra-axial hemorrhage on CT.  Prominence of the ventricles and sulci reflects moderate cortical volume loss. Diffuse periventricular and subcortical white matter change likely reflects small vessel ischemic microangiopathy. Chronic ischemic change is noted at the basal ganglia and thalami bilaterally.  The brainstem and fourth ventricle are within normal limits. The cerebral hemispheres demonstrate grossly normal gray-white differentiation. No mass effect or midline shift is seen.  There is no evidence of fracture; visualized osseous structures are unremarkable in appearance. The orbits are within normal limits. The paranasal sinuses and mastoid air cells are well-aerated. No significant soft tissue abnormalities are seen.  IMPRESSION: 1. No acute intracranial pathology seen on CT. 2. Moderate cortical volume loss and diffuse small vessel ischemic microangiopathy. 3. Chronic ischemic change at the basal  ganglia and thalami bilaterally.   Electronically Signed   By: Roanna Raider M.D.   On: 04-20-2015 19:13   Dg Chest Portable 1 View  04-20-2015   CLINICAL DATA:  Shortness of breath.  EXAM: PORTABLE CHEST - 1 VIEW  COMPARISON:  None.  FINDINGS: Mediastinum and hilar structures are normal. Lungs are clear. Heart size normal. No pleural effusion or pneumothorax.  IMPRESSION: No acute cardiopulmonary disease.   Electronically Signed   By: Maisie Fus  Register   On: 2015/04/20 17:22   Mr Brain Ltd W/o Cm  2015-04-20   CLINICAL DATA:  New onset of expressive aphasia. Syncope and vomiting.  EXAM: MRI HEAD WITHOUT CONTRAST  TECHNIQUE: Multiplanar, multiecho pulse sequences of the brain and surrounding structures were obtained without intravenous contrast.  COMPARISON:  Head CT same day.  MRI 03/04/2015.  FINDINGS: Diffusion imaging shows a 1 cm focus of restricted diffusion in the left frontal white matter adjacent to the frontal horn of the left lateral ventricle. No additional sequences were performed. T2 data shows advanced chronic small vessel disease throughout the cerebral hemispheric white matter with old lacunar infarctions affecting the basal ganglia and thalami. No susceptibility type artifact to suggest hemorrhage. No hydrocephalus, midline shift or mass effect. No sign of mass lesion. No extra-axial collection.  IMPRESSION: Diffusion sequences only. 1 cm acute infarction in the deep white matter adjacent to the frontal horn of the left lateral ventricle.   Electronically Signed   By: Paulina Fusi M.D.   On: 03/12/2015 20:07   I personally viewed above image(s) which were used in my medical decision making. Formal interpretations by Radiology.   EKG Interpretation  Date/Time:    Ventricular Rate:    PR Interval:    QRS Duration:   QT Interval:    QTC Calculation:   R Axis:     Text Interpretation:         MDM: ASHLYN CABLER is a 81 y.o. female with H&P as above who p/w CC: AMS  On  arrival, patient is hemodynamically stable and in no apparent distress. Her neuro exam is benign as above although patient is altered and not speaking. No indication for code stroke at this time. Patient will get stroke workup as well as infectious workup. Patient likely had aspiration event during this episode. She is wheezing on exam currently and will get a breathing treatment.  EKG: unchanged from previous tracings, normal sinus rhythm, nonspecific T waves changes. TWIs in lat lead no longer present. No STD/STE. Slight PR dep diffusely. No signs of acute ischemia.  1815 Pt now AAOx3. HDS, NAD. Benign neuro exam.  I re-evaluated pt at 1830 and pt was very confused. Now having LLE weakness. Code stroke called at this time. Pt then begins having significant alteration of her speech, stuttering, word salad. Pt taken to scanner.  Neurology saw pt in ED and rec'd MRI. MRI shows acute infarction in frontal lobe.  Pt to be admitted to neurology.  Old records reviewed (if available). Labs and imaging reviewed personally by myself and considered in medical decision making if ordered.  Clinical Impression: 1. Stroke with cerebral ischemia   2. Change in mental status       Disposition: Admit  Condition: stable  I have discussed the results, Dx and Tx plan with the pt(& family if present). He/she/they expressed understanding and agree(s) with the plan.  Pt seen in conjunction with Dr. Garrel Ridgel, DO [facility] Emergency Medicine Resident - PGY-3     Ames Dura, MD 04-02-2015 1610  Arby Barrette, MD 04/01/15 1116

## 2015-03-28 NOTE — H&P (Addendum)
Referring Physician: ED    Chief Complaint: code stroke, altered mental status, speech/language impairment.  HPI:                                                                                                                                         Samantha Bond is an 81 y.o. female with a past medical history significant for HTN, DM, TIA on 03/03/15, dementia, peripheral neuropathy, DJD, brought in by EMS for evaluation of the above stated symptoms. Patient was released from rehab today, was home and woke up from a nap vomiting and became unresponsive  for approximately 30 minutes was noted to have altered mental status with language impairment.  There was concern of possible right facial droop en route that had resolved upon arrival to facility Brought to the ED where was noted to have fluctuating mental status and aphasia. Initial NIHSS 10. CT brain was personally reviewed and showed no acute abnormality. Patient remained with off and on changes in mental status and language impairment.    Date last known well: 04/02/2015  Time last known well: 1830  tPA Given: no NIHSS: 10 MRS: 4  Past Medical History  Diagnosis Date  . Hypertension   . Diabetes mellitus   . DJD (degenerative joint disease)   . Peripheral neuropathy   . Mild aortic sclerosis   . Spinal stenosis   . Diverticulosis   . DJD (degenerative joint disease)     knees, back & cervical    Past Surgical History  Procedure Laterality Date  . Cholecystectomy    . Esophagogastroduodenoscopy Left 11/15/2013    Procedure: ESOPHAGOGASTRODUODENOSCOPY (EGD);  Surgeon: Arta Silence, MD;  Location: [facility] ENDOSCOPY;  Service: Endoscopy;  Laterality: Left;    Family History  Problem Relation Age of Onset  . Hyperlipidemia Mother    Social History:  reports that she has never smoked. She does not have any smokeless tobacco history on file. She reports that she does not drink alcohol or use illicit drugs. Family history: no  epilepsy, brain tumor, or brain aneurysm Allergies:  Allergies  Allergen Reactions  . Lyrica [Pregabalin] Other (See Comments)    Lethargy & blurred vision  . Metformin And Related Diarrhea and Other (See Comments)    Elevated pancreatic enzymes  . Toprol Xl [Metoprolol] Other (See Comments)    Asthma  . Benicar [Olmesartan] Other (See Comments)    arthralgias  . Vicodin [Hydrocodone-Acetaminophen] Nausea Only  . Penicillins Other (See Comments)    unknown    Medications:  I have reviewed the patient's current medications.  ROS: unable to obtain due to mental status                                                                                                                                      History obtained from EMS, daughter, chart review    Physical exam:  Constitutional: well developed, pleasant female in no apparent distress. Blood pressure 154/58, pulse 85, temperature 98.2 F (36.8 C), temperature source Oral, resp. rate 17, height _0  (1.702 m), weight 80.287 kg (177 lb), SpO2 100 %. Eyes: no jaundice or exophthalmos.  Head: normocephalic. Neck: supple, no bruits, no JVD. Cardiac: no murmurs. Lungs: clear. Abdomen: soft, no tender, no mass. Extremities: bilateral LE pitting edema, no clubbing, or cyanosis.  Skin: no rash  Neurologic Examination:                                                                                                      General: Mental Status: Alert and wake but disoriented  X 4. Motor dysphasia with intact comprehension.  Able to follow 3 step commands without difficulty. Cranial Nerves: II: Discs flat bilaterally; Visual fields grossly normal, pupils equal, round, reactive to light and accommodation III,IV, VI: ptosis not present, extra-ocular motions intact bilaterally V,VII: smile symmetric, facial light touch  sensation normal bilaterally VIII: hearing normal bilaterally IX,X: uvula rises symmetrically XI: bilateral shoulder shrug XII: midline tongue extension without atrophy or fasciculations  Motor: Significant for right LE weakness Tone and bulk:normal tone throughout; no atrophy noted Sensory: Pinprick and light touch intact throughout, bilaterally Deep Tendon Reflexes:  Right: Upper Extremity   Left: Upper extremity   biceps (C-5 to C-6) 2/4   biceps (C-5 to C-6) 2/4 tricep (C7) 2/4    triceps (C7) 2/4 Brachioradialis (C6) 2/4  Brachioradialis (C6) 2/4  Lower Extremity Lower Extremity  quadriceps (L-2 to L-4) 2/4   quadriceps (L-2 to L-4) 2/4 Achilles (S1) 2/4   Achilles (S1) 2/4  Plantars: Right: upgoing   Left: downgoing Cerebellar: Bilateral action tremor Gait:  Patient is not able to walk    Results for orders placed or performed during the hospital encounter of 03/22/2015 (from the past 48 hour(s))  CBC with Differential     Status: Abnormal   Collection Time: 04/05/2015  4:35 PM  Result Value Ref Range   WBC 4.7 4.0 - 10.5 K/uL   RBC 4.37 3.87 - 5.11 MIL/uL   Hemoglobin 11.5 (L) 12.0 -  15.0 g/dL   HCT 36.1 36.0 - 46.0 %   MCV 82.6 78.0 - 100.0 fL   MCH 26.3 26.0 - 34.0 pg   MCHC 31.9 30.0 - 36.0 g/dL   RDW 15.0 11.5 - 15.5 %   Platelets 232 150 - 400 K/uL   Neutrophils Relative % 54 43 - 77 %   Neutro Abs 2.6 1.7 - 7.7 K/uL   Lymphocytes Relative 32 12 - 46 %   Lymphs Abs 1.5 0.7 - 4.0 K/uL   Monocytes Relative 11 3 - 12 %   Monocytes Absolute 0.5 0.1 - 1.0 K/uL   Eosinophils Relative 3 0 - 5 %   Eosinophils Absolute 0.1 0.0 - 0.7 K/uL   Basophils Relative 0 0 - 1 %   Basophils Absolute 0.0 0.0 - 0.1 K/uL  Comprehensive metabolic panel     Status: Abnormal   Collection Time: 03/20/2015  4:35 PM  Result Value Ref Range   Sodium 140 135 - 145 mmol/L   Potassium 3.7 3.5 - 5.1 mmol/L   Chloride 103 101 - 111 mmol/L   CO2 25 22 - 32 mmol/L   Glucose, Bld 150 (H)  65 - 99 mg/dL   BUN 24 (H) 6 - 20 mg/dL   Creatinine, Ser 1.31 (H) 0.44 - 1.00 mg/dL   Calcium 9.3 8.9 - 10.3 mg/dL   Total Protein 6.9 6.5 - 8.1 g/dL   Albumin 3.5 3.5 - 5.0 g/dL   AST 28 15 - 41 U/L   ALT 18 14 - 54 U/L   Alkaline Phosphatase 96 38 - 126 U/L   Total Bilirubin 0.7 0.3 - 1.2 mg/dL   GFR calc non Af Amer 36 (L) >60 mL/min   GFR calc Af Amer 41 (L) >60 mL/min    Comment: (NOTE) The eGFR has been calculated using the CKD EPI equation. This calculation has not been validated in all clinical situations. eGFR's persistently <60 mL/min signify possible Chronic Kidney Disease.    Anion gap 12 5 - 15  Protime-INR     Status: None   Collection Time: 03/10/2015  4:35 PM  Result Value Ref Range   Prothrombin Time 13.8 11.6 - 15.2 seconds   INR 1.03 0.00 - 1.49  APTT     Status: None   Collection Time: 03/18/2015  4:35 PM  Result Value Ref Range   aPTT 24 24 - 37 seconds  I-Stat CG4 Lactic Acid, ED     Status: None   Collection Time: 04/02/2015  4:44 PM  Result Value Ref Range   Lactic Acid, Venous 1.39 0.5 - 2.0 mmol/L  Urinalysis, Routine w reflex microscopic     Status: Abnormal   Collection Time: 04/05/2015  6:28 PM  Result Value Ref Range   Color, Urine YELLOW YELLOW   APPearance CLOUDY (A) CLEAR   Specific Gravity, Urine 1.023 1.005 - 1.030   pH 5.5 5.0 - 8.0   Glucose, UA NEGATIVE NEGATIVE mg/dL   Hgb urine dipstick LARGE (A) NEGATIVE   Bilirubin Urine SMALL (A) NEGATIVE   Ketones, ur NEGATIVE NEGATIVE mg/dL   Protein, ur 30 (A) NEGATIVE mg/dL   Urobilinogen, UA 1.0 0.0 - 1.0 mg/dL   Nitrite NEGATIVE NEGATIVE   Leukocytes, UA MODERATE (A) NEGATIVE  Urine microscopic-add on     Status: Abnormal   Collection Time: 03/24/2015  6:28 PM  Result Value Ref Range   Squamous Epithelial / LPF FEW (A) RARE   WBC, UA 21-50 <3 WBC/hpf  RBC / HPF 11-20 <3 RBC/hpf   Bacteria, UA FEW (A) RARE   Casts HYALINE CASTS (A) NEGATIVE   Urine-Other MUCOUS PRESENT    Ct Head Wo  Contrast  03/27/2015   CLINICAL DATA:  Code stroke. New onset of expressive aphasia. Syncope and vomiting. Initial encounter.  EXAM: CT HEAD WITHOUT CONTRAST  TECHNIQUE: Contiguous axial images were obtained from the base of the skull through the vertex without intravenous contrast.  COMPARISON:  CT of the head performed 03/03/2015, and MRI/MRA of the brain performed 03/04/2015  FINDINGS: There is no evidence of acute infarction, mass lesion, or intra- or extra-axial hemorrhage on CT.  Prominence of the ventricles and sulci reflects moderate cortical volume loss. Diffuse periventricular and subcortical white matter change likely reflects small vessel ischemic microangiopathy. Chronic ischemic change is noted at the basal ganglia and thalami bilaterally.  The brainstem and fourth ventricle are within normal limits. The cerebral hemispheres demonstrate grossly normal gray-white differentiation. No mass effect or midline shift is seen.  There is no evidence of fracture; visualized osseous structures are unremarkable in appearance. The orbits are within normal limits. The paranasal sinuses and mastoid air cells are well-aerated. No significant soft tissue abnormalities are seen.  IMPRESSION: 1. No acute intracranial pathology seen on CT. 2. Moderate cortical volume loss and diffuse small vessel ischemic microangiopathy. 3. Chronic ischemic change at the basal ganglia and thalami bilaterally.   Electronically Signed   By: Garald Balding M.D.   On: 03/26/2015 19:13   Dg Chest Portable 1 View  03/21/2015   CLINICAL DATA:  Shortness of breath.  EXAM: PORTABLE CHEST - 1 VIEW  COMPARISON:  None.  FINDINGS: Mediastinum and hilar structures are normal. Lungs are clear. Heart size normal. No pleural effusion or pneumothorax.  IMPRESSION: No acute cardiopulmonary disease.   Electronically Signed   By: Marcello Moores  Register   On: 03/22/2015 17:22    Assessment: 81 y.o. female brought in with fluctuating changes in mental status  and speech/language impairment. Has right LE weakness on exam. Initial NIHSS 10. Patient had similar presentation on 03/03/15 and underwent extensive TIA work up that was unrevealing. Patient with underlying dementia, some uncertainties on precise last known well, thus requested DWI-MRI that was personally reviewed and showed a 1 cm acute infarction in the deep white matter adjacent to the frontal horn of the left lateral ventricle. There was a delay initiating IV tpa treatment due to uncertainty about last known well and fluctuating symptoms. NIHSS 11 warrants obtaining CTA brain to exclude or confirm the presence of proximal large artery involvement. Admit to NICU and follow post IV tpa protocol. Stroke team will follow in tomorrow.   Stroke Risk Factors - age, r HTN, DM, TIA    Dorian Pod ,MD [facility] [phone]

## 2015-03-28 NOTE — ED Notes (Signed)
Dr. Norberto Sorenson notified that patient is still wheezing, cont neb ordered

## 2015-03-28 NOTE — ED Notes (Signed)
Contacted [facility] to call Code Stroke °

## 2015-03-28 NOTE — ED Notes (Signed)
Patient now responding to questions verbally and following commands.  She is alert and oriented x 3.

## 2015-03-28 NOTE — ED Notes (Addendum)
Per EMS: TIA, hx of same, had one last month, sent to rehab, dc home today.  Was taking nap in chair when she woke up she began vomiting, during this time she had a syncopal episode and vomited again and likely aspirated.  Patient is normally able to ambulate and answer questions although has hx of dementia.  Patient now nonvberbal and is audibly wheezing, lung fields are rhonchus throughout especially on the right.  Was 91 % on room air with EMS, 94 % on 4 liters.  Currently patient is 100% on 2 liters, is still wheezing but is not tachypnic.  There was concern of possible right facial droop en route that had resolved upon arrival to facility.  Patient was unresponsive when ems arrived, however quickly became alert.  She vomited once again with EMS and was given 4 mg of zofran en route.  65 gauge left wrist.  Dr. Colvin Caroli met patient in hallway with EMS before she was roomed.  No code stroke called at this time per dr. Colvin Caroli.

## 2015-03-28 NOTE — ED Notes (Signed)
Patient had change in status, Dr. Nelida Gores notified.  Patient is now demonstrating expressive aphasia,  Patient was in the midst of being transported to CT by CT tech.  Patient can now no longer answer questions appropriately.  Dr. Nelida Gores notified and pt transported to CT.  Code stroke called, see NIH and stroke worksheet.

## 2015-03-29 ENCOUNTER — Inpatient Hospital Stay (HOSPITAL_COMMUNITY): Payer: Medicare Other

## 2015-03-29 DIAGNOSIS — I639 Cerebral infarction, unspecified: Secondary | ICD-10-CM

## 2015-03-29 DIAGNOSIS — I610 Nontraumatic intracerebral hemorrhage in hemisphere, subcortical: Secondary | ICD-10-CM

## 2015-03-29 DIAGNOSIS — J9601 Acute respiratory failure with hypoxia: Secondary | ICD-10-CM

## 2015-03-29 DIAGNOSIS — I613 Nontraumatic intracerebral hemorrhage in brain stem: Secondary | ICD-10-CM

## 2015-03-29 DIAGNOSIS — I63312 Cerebral infarction due to thrombosis of left middle cerebral artery: Secondary | ICD-10-CM

## 2015-03-29 DIAGNOSIS — I615 Nontraumatic intracerebral hemorrhage, intraventricular: Secondary | ICD-10-CM

## 2015-03-29 DIAGNOSIS — R40243 Glasgow coma scale score 3-8: Secondary | ICD-10-CM

## 2015-03-29 DIAGNOSIS — G911 Obstructive hydrocephalus: Secondary | ICD-10-CM

## 2015-03-29 LAB — BLOOD GAS, ARTERIAL
Acid-Base Excess: 0.2 mmol/L (ref 0.0–2.0)
Bicarbonate: 25 mEq/L — ABNORMAL HIGH (ref 20.0–24.0)
DRAWN BY: 365271
FIO2: 1
MECHVT: 500 mL
O2 Saturation: 99.7 %
PEEP: 5 cmH2O
PH ART: 7.358 (ref 7.350–7.450)
PO2 ART: 449 mmHg — AB (ref 80.0–100.0)
Patient temperature: 98.6
RATE: 14 resp/min
TCO2: 26.4 mmol/L (ref 0–100)
pCO2 arterial: 45.6 mmHg — ABNORMAL HIGH (ref 35.0–45.0)

## 2015-03-29 LAB — GLUCOSE, CAPILLARY
GLUCOSE-CAPILLARY: 300 mg/dL — AB (ref 65–99)
GLUCOSE-CAPILLARY: 250 mg/dL — AB (ref 65–99)

## 2015-03-29 LAB — MRSA PCR SCREENING: MRSA by PCR: NEGATIVE

## 2015-03-29 MED ORDER — PROPOFOL 1000 MG/100ML IV EMUL: 5.0000 ug/kg/min | INTRAVENOUS | Status: DC

## 2015-03-29 MED ORDER — PHENYLEPHRINE HCL 10 MG/ML IJ SOLN
0.0000 ug/min | INTRAVENOUS | Status: DC
  Administered 2015-03-29: 110 ug/min via INTRAVENOUS
  Administered 2015-03-29: 20 ug/min via INTRAVENOUS
  Administered 2015-03-29: 110 ug/min via INTRAVENOUS
  Administered 2015-03-29 (×2): 80 ug/min via INTRAVENOUS
  Filled 2015-03-29 (×6): qty 1

## 2015-03-29 MED ORDER — SUCCINYLCHOLINE CHLORIDE 20 MG/ML IJ SOLN
70.0000 mg | Freq: Once | INTRAMUSCULAR | Status: AC
  Administered 2015-03-29: 70 mg via INTRAVENOUS

## 2015-03-29 MED ORDER — PROPOFOL 1000 MG/100ML IV EMUL
INTRAVENOUS | Status: AC
  Administered 2015-03-29: 1000 mg
  Filled 2015-03-29: qty 100

## 2015-03-29 MED ORDER — MORPHINE BOLUS VIA INFUSION
5.0000 mg | INTRAVENOUS | Status: DC | PRN
  Filled 2015-03-29: qty 20

## 2015-03-29 MED ORDER — ATROPINE SULFATE 0.1 MG/ML IJ SOLN
INTRAMUSCULAR | Status: AC
  Filled 2015-03-29: qty 10

## 2015-03-29 MED ORDER — PHENYLEPHRINE HCL 10 MG/ML IJ SOLN
0.0000 ug/min | INTRAVENOUS | Status: DC
  Administered 2015-03-29: 80 ug/min via INTRAVENOUS
  Filled 2015-03-29: qty 2

## 2015-03-29 MED ORDER — SODIUM CHLORIDE 0.9 % IV SOLN
50.0000 ug/h | INTRAVENOUS | Status: DC
  Administered 2015-03-29: 50 ug/h via INTRAVENOUS
  Filled 2015-03-29: qty 50

## 2015-03-29 MED ORDER — MIDAZOLAM HCL 2 MG/2ML IJ SOLN: 1.0000 mg | INTRAMUSCULAR | Status: DC | PRN

## 2015-03-29 MED ORDER — NICARDIPINE HCL IN NACL 20-0.86 MG/200ML-% IV SOLN
3.0000 mg/h | INTRAVENOUS | Status: DC
  Administered 2015-03-28: 2.5 mg/h via INTRAVENOUS

## 2015-03-29 MED ORDER — MORPHINE SULFATE 25 MG/ML IV SOLN
10.0000 mg/h | INTRAVENOUS | Status: DC
  Administered 2015-03-29: 10 mg/h via INTRAVENOUS
  Filled 2015-03-29: qty 10

## 2015-03-29 MED ORDER — INSULIN ASPART 100 UNIT/ML ~~LOC~~ SOLN
0.0000 [IU] | SUBCUTANEOUS | Status: DC
  Administered 2015-03-29: 5 [IU] via SUBCUTANEOUS
  Administered 2015-03-29: 3 [IU] via SUBCUTANEOUS

## 2015-03-29 MED ORDER — FAMOTIDINE IN NACL 20-0.9 MG/50ML-% IV SOLN
20.0000 mg | Freq: Every day | INTRAVENOUS | Status: DC
  Administered 2015-03-29: 20 mg via INTRAVENOUS
  Filled 2015-03-29: qty 50

## 2015-03-31 ENCOUNTER — Encounter: Payer: Self-pay | Admitting: Internal Medicine

## 2015-04-10 NOTE — Consult Note (Signed)
PULMONARY / CRITICAL CARE MEDICINE   Name: Samantha Bond MRN: [id number] DOB: 12-Jun-1927    ADMISSION DATE:  04/01/2015 CONSULTATION DATE:  04/14/15  REFERRING MD :  Pearlean Brownie  CHIEF COMPLAINT:  Stroke  INITIAL PRESENTATION: 81 y/o female with a history of TIA was seen in the [facility] ED yesterday after being found unresponsive and had a new R sided weakness, MRI confirmed acute stroke, she was given TPA.  Had a large ICH afterwards, PCCM intubated the patient.  STUDIES:  2015/04/14 CT head > large pontine hemorrhage  SIGNIFICANT EVENTS: 8/19 Admission, given TPA 8/20 AM intubated overnight   HISTORY OF PRESENT ILLNESS:  81 y/o female with a history of TIA was seen in the [facility] ED yesterday after being found unresponsive and had a new R sided weakness, MRI confirmed acute stroke, she was given TPA.  Had a large ICH afterwards, PCCM intubated the patient.  No further history could be obtained.  PAST MEDICAL HISTORY :   has a past medical history of Hypertension; Diabetes mellitus; DJD (degenerative joint disease); Peripheral neuropathy; Mild aortic sclerosis; Spinal stenosis; Diverticulosis; and DJD (degenerative joint disease).  has past surgical history that includes Cholecystectomy and Esophagogastroduodenoscopy (Left, 11/15/2013). Prior to Admission medications   Medication Sig Start Date End Date Taking? Authorizing Provider  atorvastatin (LIPITOR) 40 MG tablet Take 1 tablet (40 mg total) by mouth daily at 6 PM. 03/07/15  Yes Zannie Cove, MD  diltiazem (CARDIZEM CD) 300 MG 24 hr capsule Take 1 capsule (300 mg total) by mouth daily. 03/07/15  Yes Zannie Cove, MD  LORazepam (ATIVAN) 0.5 MG tablet Take 1 tablet (0.5 mg total) by mouth every 8 (eight) hours as needed for anxiety (for agitation). 03/07/15  Yes Zannie Cove, MD  omeprazole (PRILOSEC) 20 MG capsule Take 20 mg by mouth daily.   Yes Historical Provider, MD  sertraline (ZOLOFT) 50 MG tablet Take 50 mg by mouth daily.   Yes  Historical Provider, MD  aspirin 325 MG tablet Take 1 tablet (325 mg total) by mouth daily. Patient not taking: Reported on 03/11/2015 03/07/15   Zannie Cove, MD  haloperidol (HALDOL) 0.5 MG tablet Take 1 tablet (0.5 mg total) by mouth 2 (two) times daily as needed for agitation. Patient not taking: Reported on 03/20/2015 03/07/15   Zannie Cove, MD  senna (SENOKOT) 8.6 MG TABS tablet Take 1 tablet (8.6 mg total) by mouth daily as needed for mild constipation. Patient not taking: Reported on 04/09/2015 03/07/15   Zannie Cove, MD   Allergies  Allergen Reactions  . Lyrica [Pregabalin] Other (See Comments)    Lethargy & blurred vision  . Metformin And Related Diarrhea and Other (See Comments)    Elevated pancreatic enzymes  . Toprol Xl [Metoprolol] Other (See Comments)    Asthma  . Benicar [Olmesartan] Other (See Comments)    arthralgias  . Vicodin [Hydrocodone-Acetaminophen] Nausea Only  . Penicillins Other (See Comments)    unknown    FAMILY HISTORY:  has no family status information on file.  SOCIAL HISTORY:  reports that she has never smoked. She does not have any smokeless tobacco history on file. She reports that she does not drink alcohol or use illicit drugs.  REVIEW OF SYSTEMS:  Cannot obtain  SUBJECTIVE:   VITAL SIGNS: Temp:  [97.4 F (36.3 C)-98.6 F (37 C)] 97.4 F (36.3 C) (08/20 0344) Pulse Rate:  [52-108] 61 (08/20 0736) Resp:  [10-35] 12 (08/20 0736) BP: (83-264)/(35-110) 180/65 mmHg (08/20 0736) SpO2:  [  98 %-100 %] 100 % (08/20 0736) FiO2 (%):  [30 %-100 %] 30 % (08/20 0736) Weight:  [153 lb (69.4 kg)-177 lb 0.5 oz (80.3 kg)] 153 lb (69.4 kg) (08/19 2200) HEMODYNAMICS:   VENTILATOR SETTINGS: Vent Mode:  [-] PRVC FiO2 (%):  [30 %-100 %] 30 % Set Rate:  [14 bmp] 14 bmp Vt Set:  [500 mL] 500 mL PEEP:  [5 cmH20] 5 cmH20 Plateau Pressure:  [15 cmH20-17 cmH20] 16 cmH20 INTAKE / OUTPUT:  Intake/Output Summary (Last 24 hours) at April 18, 2015 0803 Last data  filed at April 18, 2015 0745  Gross per 24 hour  Intake    675 ml  Output    550 ml  Net    125 ml    PHYSICAL EXAMINATION: General:  On vent Neuro:  Flex response to pain R leg, no gag, no pupillary light reflex HEENT:  ETT in place Cardiovascular:  RRR,. No mgr Lungs:  CTA B, vent supported breaths Abdomen:  BS+, soft Musculoskeletal:  No obvious deformities Skin:  No rash or breakdown  LABS:  CBC  Recent Labs Lab 04/02/2015 1635  WBC 4.7  HGB 11.5*  HCT 36.1  PLT 232   Coag's  Recent Labs Lab 04/03/2015 1635  APTT 24  INR 1.03   BMET  Recent Labs Lab 03/22/2015 1635  NA 140  K 3.7  CL 103  CO2 25  BUN 24*  CREATININE 1.31*  GLUCOSE 150*   Electrolytes  Recent Labs Lab 03/20/2015 1635  CALCIUM 9.3   Sepsis Markers  Recent Labs Lab 03/19/2015 1644  LATICACIDVEN 1.39   ABG  Recent Labs Lab 2015/04/18 0056  PHART 7.358  PCO2ART 45.6*  PO2ART 449*   Liver Enzymes  Recent Labs Lab 03/24/2015 1635  AST 28  ALT 18  ALKPHOS 96  BILITOT 0.7  ALBUMIN 3.5   Cardiac Enzymes No results for input(s): TROPONINI, PROBNP in the last 168 hours. Glucose No results for input(s): GLUCAP in the last 168 hours.  Imaging Ct Angio Head W/cm &/or Wo Cm  03/23/2015   CLINICAL DATA:  Acute strokes treated with tPA. Speech and language impairment.  EXAM: CT ANGIOGRAPHY HEAD  TECHNIQUE: Multidetector CT imaging of the head was performed using the standard protocol during bolus administration of intravenous contrast. Multiplanar CT image reconstructions and MIPs were obtained to evaluate the vascular anatomy.  CONTRAST:  50mL OMNIPAQUE IOHEXOL 350 MG/ML SOLN  COMPARISON:  MRI earlier same day.  Head CT ear earlier same day.  FINDINGS: CT HEAD  Again seen is atrophy and chronic small vessel disease throughout the brain. No sign of acute infarction, mass lesion, hemorrhage, hydrocephalus or extra-axial collection.  CTA HEAD  Anterior circulation: Both internal carotid arteries  are widely patent through the skullbase. There is atherosclerotic calcification in the carotid siphon regions but no stenosis more than about 20%. The anterior and middle cerebral vessels are patent bilaterally without proximal stenosis, aneurysm or vascular malformation. The upper cervical internal carotid artery on the right is ectatic. Distal intracranial branches show irregularity.  Posterior circulation: Both vertebral arteries are widely patent to the basilar. There is focal stenosis in the mid basilar between the anterior inferior cerebellar arteries and the superior cerebellar arteries. Luminal diameter is 1 mm. The stenosis is 70% by approximation. Superior cerebellar and posterior cerebral arteries are patent. More distal branch vessels do show atherosclerotic irregularity.  Venous sinuses: Patent and normal  Anatomic variants: None significant  Delayed phase:No abnormal enhancement  IMPRESSION: No intracranial large  or medium vessel occlusion. No anterior circulation stenosis.  Mid basilar stenosis, approximately 70% with luminal diameter 1 mm.  Diffuse intracranial small vessel irregularity.   Electronically Signed   By: Paulina Fusi M.D.   On: 2015-04-27 21:26   Ct Head Wo Contrast  03/27/2015   CLINICAL DATA:  Followup hemorrhage, blown pupils.  Hypotensive.  EXAM: CT HEAD WITHOUT CONTRAST  TECHNIQUE: Contiguous axial images were obtained from the base of the skull through the vertex without intravenous contrast.  COMPARISON:  CT head Apr 27, 2015  FINDINGS: Interval enlargement of LEFT pontine hemorrhage, now measuring 3.6 x 5.5 cm, with upward and downward cerebellar herniation. Surrounding vasogenic edema, and diffuse cerebellar folia effacement. Hematoma dissects along the cerebral peduncle into the LEFT thalamus. Mass effect on the third ventricle resulting in severe obstructive hydrocephalus. Layering blood products in the occipital horns, third ventricles. Diffuse sulcal effacement. Small  amount of subarachnoid blood now present within the sylvian fissures, and at the foramen of Monro. Completely effaced basilar cisterns.  Small area RIGHT frontal encephalomalacia. Confluent supratentorial white matter hypodensities.  Life-support lines in place. Ocular globes and orbital contents are unremarkable. Visualized paranasal sinuses and mastoid air cells are well aerated. No skull fracture.  IMPRESSION: Marked interval enlargement of LEFT pontine hemorrhage, now dissecting into the LEFT thalamus.  Intraventricular blood products consistent with extension of the parenchymal hemorrhage, with severe obstructive hydrocephalus.  Diffuse global edema, effaced basilar cisterns with cerebellar herniation. Small amount of subarachnoid blood products.  Acute findings discussed with and reconfirmed by Dr.OSVALDO CAMILO on 03/31/2015 at 5:55 am.   Electronically Signed   By: Awilda Metro M.D.   On: 03/16/2015 06:00   Ct Head Wo Contrast  04-27-2015   CLINICAL DATA:  Awoke from nap today vomiting, and became unresponsive for 30 minutes. Altered mental status with possible RIGHT facial droop. History of hypertension, diabetes, TIA.  EXAM: CT HEAD WITHOUT CONTRAST  TECHNIQUE: Contiguous axial images were obtained from the base of the skull through the vertex without intravenous contrast.  COMPARISON:  Limited MRI of the brain 04/27/2015 at 1942 hours and CTA of the head 04/27/15 at 2104 hours and MRI of the brain March 04, 2015.  FINDINGS: New density within the LEFT pons extending to the brachium pontis without mass effect or edema, this ill-defined area measures approximately 2 x 1.2 cm. No additional areas of abnormal intraparenchymal density. No midline shift or mass effect. Moderate to severe ventriculomegaly, likely on the basis of global parenchymal brain volume loss as there is overall commensurate enlargement of cerebral sulci and cerebellar folia. Smaller of apparent RIGHT frontal vertex  encephalomalacia. Confluent supratentorial white matter hypodensities. Old bilateral thalamus and bilateral basal ganglia lacunar infarcts. No acute large vascular territory infarct.  No abnormal extra-axial fluid collections. Basal cisterns are patent.  Visualized paranasal sinuses and mastoid air cells are well aerated. No skull fracture. Ocular globes and orbital contents are normal.  IMPRESSION: Vague 2 x 1.2 cm density within the LEFT pons, which may represent hemorrhage or enhancement (possibly subacute infarct) giving contrast administration approximately ago.  Stable chronic changes including severe chronic small vessel ischemic disease and old bilateral basal ganglia and thalamus lacunar infarcts.  Preliminary findings discussed by Dr. Cherly Hensen with Dr. Leroy Kennedy on 2015-04-27 at approximately 2340 hours.   Electronically Signed   By: Awilda Metro M.D.   On: 04-27-2015 23:56   Ct Head Wo Contrast  April 27, 2015   CLINICAL  DATA:  Code stroke. New onset of expressive aphasia. Syncope and vomiting. Initial encounter.  EXAM: CT HEAD WITHOUT CONTRAST  TECHNIQUE: Contiguous axial images were obtained from the base of the skull through the vertex without intravenous contrast.  COMPARISON:  CT of the head performed 03/03/2015, and MRI/MRA of the brain performed 03/04/2015  FINDINGS: There is no evidence of acute infarction, mass lesion, or intra- or extra-axial hemorrhage on CT.  Prominence of the ventricles and sulci reflects moderate cortical volume loss. Diffuse periventricular and subcortical white matter change likely reflects small vessel ischemic microangiopathy. Chronic ischemic change is noted at the basal ganglia and thalami bilaterally.  The brainstem and fourth ventricle are within normal limits. The cerebral hemispheres demonstrate grossly normal gray-white differentiation. No mass effect or midline shift is seen.  There is no evidence of fracture; visualized osseous structures are unremarkable in  appearance. The orbits are within normal limits. The paranasal sinuses and mastoid air cells are well-aerated. No significant soft tissue abnormalities are seen.  IMPRESSION: 1. No acute intracranial pathology seen on CT. 2. Moderate cortical volume loss and diffuse small vessel ischemic microangiopathy. 3. Chronic ischemic change at the basal ganglia and thalami bilaterally.   Electronically Signed   By: Roanna Raider M.D.   On: 04/04/2015 19:13   Dg Chest Port 1 View  03/15/2015   CLINICAL DATA:  Endotracheal tube and nasogastric tube placement. Initial encounter.  EXAM: PORTABLE CHEST - 1 VIEW  COMPARISON:  Chest radiograph performed April 04, 2015  FINDINGS: The patient's endotracheal tube is seen ending 4 cm above the carina. An enteric tube is noted extending below the diaphragm.  There is mild elevation of the right hemidiaphragm. Mild right basilar opacity likely reflects atelectasis. No pleural effusion or pneumothorax is seen. The left lung appears clear.  The cardiomediastinal silhouette is borderline normal in size. There is unfolding of the thoracic aorta. No acute osseous abnormalities are seen.  IMPRESSION: 1. Endotracheal tube seen ending 4 cm above the carina. 2. Mild elevation of the right hemidiaphragm. Mild right basilar airspace opacity likely reflects atelectasis.   Electronically Signed   By: Roanna Raider M.D.   On: 03/22/2015 01:53   Dg Chest Portable 1 View  04-04-2015   CLINICAL DATA:  Shortness of breath.  EXAM: PORTABLE CHEST - 1 VIEW  COMPARISON:  None.  FINDINGS: Mediastinum and hilar structures are normal. Lungs are clear. Heart size normal. No pleural effusion or pneumothorax.  IMPRESSION: No acute cardiopulmonary disease.   Electronically Signed   By: Maisie Fus  Register   On: 04/04/2015 17:22   Dg Abd Portable 1v  03/23/2015   CLINICAL DATA:  Nasogastric tube placement.  Initial encounter.  EXAM: PORTABLE ABDOMEN - 1 VIEW  COMPARISON:  CT of the abdomen and pelvis performed  05/20/2005  FINDINGS: The patient's enteric tube is seen ending overlying the body of the stomach, with the side port also noted at the body of stomach.  The visualized bowel gas pattern is grossly unremarkable. A moderate to large amount of stool is noted in the colon, raising question for mild constipation. No free intra-abdominal air is seen, though evaluation for free air is limited on a single supine view.  Medial right basilar airspace opacity may reflect atelectasis or possibly pneumonia.  IMPRESSION: 1. Enteric tube noted ending overlying the body of the stomach, with the side port at the body of the stomach. 2. Moderate to large amount of stool noted in the colon, raising question for mild constipation.  3. Medial right basilar airspace opacity may reflect atelectasis or possibly pneumonia.   Electronically Signed   By: Roanna Raider M.D.   On: 03/18/2015 02:00   Mr Brain Ltd W/o Cm  10-Apr-2015   CLINICAL DATA:  New onset of expressive aphasia. Syncope and vomiting.  EXAM: MRI HEAD WITHOUT CONTRAST  TECHNIQUE: Multiplanar, multiecho pulse sequences of the brain and surrounding structures were obtained without intravenous contrast.  COMPARISON:  Head CT same day.  MRI 03/04/2015.  FINDINGS: Diffusion imaging shows a 1 cm focus of restricted diffusion in the left frontal white matter adjacent to the frontal horn of the left lateral ventricle. No additional sequences were performed. T2 data shows advanced chronic small vessel disease throughout the cerebral hemispheric white matter with old lacunar infarctions affecting the basal ganglia and thalami. No susceptibility type artifact to suggest hemorrhage. No hydrocephalus, midline shift or mass effect. No sign of mass lesion. No extra-axial collection.  IMPRESSION: Diffusion sequences only. 1 cm acute infarction in the deep white matter adjacent to the frontal horn of the left lateral ventricle.   Electronically Signed   By: Paulina Fusi M.D.   On:  04/10/15 20:07     ASSESSMENT / PLAN:  NEUROLOGIC A:  Acute ischemic stroke, treated with TPA, complicated by large pontine hemorrhage Now with devastating neurologic injury; has flex response to pain on exam, but no brainstem reflexes; suspect this will progress to brain death quickly; per neurosurgery no benefit from attempt at surgery. P:   Post stroke and ICH care per neurology  PULMONARY OETT 8/20  A:Acute respiratory failure due to inability to protect airway No respiratory effort or gag reflex P:   Full vent support VAP bundle  CARDIOVASCULAR CVL none A: Hypertension P:  Post TPA and ICH BP management per neurology  RENAL A:  CKD P:   Monitor BMET and UOP Replace electrolytes as needed  GASTROINTESTINAL A:  No acute issues P:   Add pepcid for SUP Hold tube feedings for now until family conversation  HEMATOLOGIC A:  Intracranial hemorrhage post TPA P:  Monitor for other sites of bleeding  INFECTIOUS A:  No acute issues P:   Monitor for fever  ENDOCRINE A:  DM2 P:   Add SSI    FAMILY  - Updates: none bedside  My cc time 30 minutes  Heber A[place], MD [place] [facility] Pager: [phone] Cell: [phone] After 3pm or if no response, call [phone]

## 2015-04-10 NOTE — Progress Notes (Signed)
235 mL of Fentanyl drip wasted in sink.  Verified by Babette Relic, RN.

## 2015-04-10 NOTE — Progress Notes (Addendum)
Pt noted to be more lethargic, has R facial droop and nystagmus noted to bilateral pupils. MD Camillo notified.  Orders obtain to go for STAT head CT

## 2015-04-10 NOTE — Accreditation Note (Signed)
o Restraints not reported to CMS Pursuant to regulation 482.13 (G) (3) use of soft wrist restraints was logged on 08.22.2016 at [phone] by Rosine Door, RN, Patient Safety and Accreditation.

## 2015-04-10 NOTE — Progress Notes (Signed)
eLink Physician-Brief Progress Note Patient Name: Samantha Bond DOB: 19-Sep-1926 MRN: [id number]   Date of Service  03/15/2015  HPI/Events of Note  RN notified that SBP in 90s now. Cardene on hold.  eICU Interventions  RN to notify Neuro. Start Neo for goal SBP 140-160.     Intervention Category Major Interventions: Hypotension - evaluation and management  Lawanda Cousins 04/02/2015, 2:37 AM

## 2015-04-10 NOTE — Discharge Summary (Signed)
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  Past Medical History  Diagnosis Date  . Hypertension   . Diabetes mellitus   . DJD (degenerative joint disease)   . Peripheral neuropathy   . Mild aortic sclerosis   . Spinal stenosis   . Diverticulosis   . DJD (degenerative joint disease)     knees, back & cervical   Past Surgical History  Procedure Laterality Date  . Cholecystectomy    . Esophagogastroduodenoscopy Left 11/15/2013    Procedure: ESOPHAGOGASTRODUODENOSCOPY (EGD);  Surgeon: Willis Modena, MD;  Location: [facility] ENDOSCOPY;  Service: Endoscopy;  Laterality: Left;      Medication List    ASK your doctor about these medications        aspirin 325 MG tablet  Take 1 tablet (325 mg total) by mouth daily.     atorvastatin 40 MG tablet  Commonly known as:  LIPITOR  Take 1 tablet (40 mg total) by mouth daily at 6 PM.     diltiazem 300 MG 24 hr capsule  Commonly known as:  CARDIZEM CD  Take 1 capsule (300 mg total) by mouth daily.     haloperidol 0.5 MG tablet  Commonly known as:  HALDOL  Take 1 tablet (0.5 mg total) by mouth 2 (two) times daily as needed for agitation.     LORazepam 0.5 MG tablet  Commonly known as:  ATIVAN  Take 1 tablet (0.5 mg total) by mouth every 8 (eight) hours as needed for anxiety (for agitation).     omeprazole 20 MG capsule  Commonly known as:  PRILOSEC  Take 20 mg by mouth daily.     senna 8.6 MG Tabs tablet  Commonly known as:  SENOKOT  Take 1 tablet (8.6 mg total) by  mouth daily as needed for mild constipation.     sertraline 50 MG tablet  Commonly known as:  ZOLOFT  Take 50 mg by mouth daily.        LABORATORY STUDIES CBC    Component Value Date/Time   WBC 4.7 03/24/2015 1635   RBC 4.37 03/27/2015 1635   HGB 11.5* 04/02/2015 1635   HCT 36.1 04/04/2015 1635   PLT 232 04/01/2015 1635   MCV 82.6 04/05/2015 1635   MCH 26.3 03/24/2015 1635   MCHC 31.9 04/03/2015 1635   RDW 15.0 03/21/2015 1635   LYMPHSABS 1.5 03/16/2015 1635   MONOABS 0.5 03/15/2015 1635   EOSABS 0.1 04/08/2015 1635   BASOSABS 0.0 04/09/2015 1635   CMP    Component Value Date/Time   NA 140 03/25/2015 1635   K 3.7 04/03/2015 1635   CL 103 04/02/2015 1635   CO2 25 03/15/2015 1635   GLUCOSE 150* 03/12/2015 1635   BUN 24* 03/11/2015 1635   CREATININE 1.31* 03/24/2015 1635   CALCIUM 9.3 03/19/2015 1635   PROT 6.9 03/31/2015 1635   ALBUMIN 3.5 03/27/2015 1635   AST 28 03/28/2015 1635   ALT 18 03/28/2015 1635   ALKPHOS 96  04/03/15 1635   BILITOT 0.7 April 03, 2015 1635   GFRNONAA 36* 2015-04-03 1635   GFRAA 41* 04-03-15 1635   COAGS Lab Results  Component Value Date   INR 1.03 04/03/2015   INR 0.99 11/07/2013   Lipid Panel    Component Value Date/Time   CHOL 263* 03/04/2015 0620   TRIG 104 03/04/2015 0620   HDL 64 03/04/2015 0620   CHOLHDL 4.1 03/04/2015 0620   VLDL 21 03/04/2015 0620   LDLCALC 178* 03/04/2015 0620   HgbA1C  Lab Results  Component Value Date   HGBA1C 6.6* 03/03/2015   Cardiac Panel (last 3 results) No results for input(s): CKTOTAL, CKMB, TROPONINI, RELINDX in the last 72 hours. Urinalysis    Component Value Date/Time   COLORURINE YELLOW 03-Apr-2015 1828   APPEARANCEUR CLOUDY* 2015/04/03 1828   LABSPEC 1.023 2015/04/03 1828   PHURINE 5.5 Apr 03, 2015 1828   GLUCOSEU NEGATIVE April 03, 2015 1828   HGBUR LARGE* 04-03-15 1828   BILIRUBINUR SMALL* 04-03-15 1828   KETONESUR NEGATIVE 04-03-2015 1828   PROTEINUR 30* 2015-04-03 1828    UROBILINOGEN 1.0 Apr 03, 2015 1828   NITRITE NEGATIVE 04-03-15 1828   LEUKOCYTESUR MODERATE* April 03, 2015 1828   Urine Drug Screen No results found for: LABOPIA, COCAINSCRNUR, LABBENZ, AMPHETMU, THCU, LABBARB  Alcohol Level No results found for: [facility]   SIGNIFICANT DIAGNOSTIC STUDIES Ct Angio Head W/cm &/or Wo Cm 04-03-15  No intracranial large or medium vessel occlusion. No anterior circulation stenosis. Mid basilar stenosis, approximately 70% with luminal diameter 1 mm. Diffuse intracranial small vessel irregularity.   Ct Head Wo Contrast 04/08/2015  Marked interval enlargement of LEFT pontine hemorrhage, now dissecting into the LEFT thalamus. Intraventricular blood products consistent with extension of the parenchymal hemorrhage, with severe obstructive hydrocephalus. Diffuse global edema, effaced basilar cisterns with cerebellar herniation. Small amount of subarachnoid blood products. Acute findings discussed with and reconfirmed by Dr.OSVALDO CAMILO on 03/13/2015 at 5:55 am.   Ct Head Wo Contrast 04/03/2015  Vague 2 x 1.2 cm density within the LEFT pons, which may represent hemorrhage or enhancement (possibly subacute infarct) giving contrast administration approximately ago. Stable chronic changes including severe chronic small vessel ischemic disease and old bilateral basal ganglia and thalamus lacunar infarcts.   Ct Head Wo Contrast 04/03/15  1. No acute intracranial pathology seen on CT.  2. Moderate cortical volume loss and diffuse small vessel ischemic microangiopathy.  3. Chronic ischemic change at the basal ganglia and thalami bilaterally.   Dg Chest Port 1 View 03/11/2015  1. Endotracheal tube seen ending 4 cm above the carina.  2. Mild elevation of the right hemidiaphragm. Mild right basilar airspace opacity likely reflects atelectasis.  Dg Chest Portable 1 View 03-Apr-2015  No acute cardiopulmonary disease.   Dg Abd Portable  1v 04/06/2015  1. Enteric tube noted ending overlying the body of the stomach, with the side port at the body of the stomach.  2. Moderate to large amount of stool noted in the colon, raising question for mild constipation.  3. Medial right basilar airspace opacity may reflect atelectasis or possibly pneumonia.   Mr Brain Ltd W/o Cm 2015/04/03  Diffusion sequences only. 1 cm acute infarction in the deep white matter adjacent to the frontal horn of the left lateral ventricle.      HISTORY OF PRESENT ILLNESS COLLEEN KOTLARZ is an 81 y.o. female with a past medical history significant for HTN, chronic renal failure, DM, admission 03/03/2015 for AMS / TIA, dementia, peripheral neuropathy, DJD, brought in by EMS for  evaluation of altered mental status, speech and language impairment. Patient was released from rehab today, was home and woke up from a nap vomiting and became unresponsive for approximately 30 minutes was noted to have altered mental status with language impairment. There was concern of possible right facial droop en route that had resolved upon arrival to facility Brought to the ED where was noted to have fluctuating mental status and aphasia. Initial NIHSS 10. CT brain was personally reviewed and showed no acute abnormality. Patient remained with off and on changes in mental status and language impairment.   Date last known well: April 21, 2015  Time last known well: 1630  tPA Given: Yes - Activase 72 mg on Friday, 04/21/2015 at 2000. NIHSS: 10 MRS: 4   HOSPITAL COURSE Pt received tPA treatment and was admitted to neuro ICU. Overnight pt had neuro decline, BP was high put on Nicardipine and repeat CT showed large brainstem hemorrhage, ICH with IVH, hydrocephalus, indicating very poor prognosis. On exam next day, Pt was in coma, lack of all brainstem reflexes tested. Discussed with daughter in length at bedside, made DNR. Daughter also talked to other family members  later the day. They requested comfort care and withdraw care. Pt was extubated and put on Morphine drip. Pt was pronounced dead on 04-22-2015 at 1502.   DISCHARGE EXAM Pt deceased.   32 minutes were spent preparing discharge.  Marvel Plan, MD PhD Stroke Neurology 04/08/2015 11:23 PM

## 2015-04-10 NOTE — Progress Notes (Addendum)
Patient vomited moderate amounts of brown gastric contents and noted to brady down to 50's for approximately 1 min right after CT head complete. Pt gently rolled to sude and mouth suctioned using yaunker. Pt's heart rate now sustaining in 90s-100. MD Camillo notified.   11:50PM: Pt noted to have snoring respirations and no longer has movement to all four extremities when RN illicite pain. MD Camillo notified

## 2015-04-10 NOTE — Progress Notes (Signed)
Physical Therapy Discharge Patient Details Name: Samantha Bond MRN: [id number] DOB: 1927-07-02 Today's Date: 04/07/2015 Time:  -     Patient discharged from PT services secondary to medical decline - will need to re-order PT to resume therapy services.  Please see latest therapy progress note for current level of functioning and progress toward goals.    Progress and discharge plan discussed with patient and/or caregiver: Patient unable to participate in discharge planning and no caregivers available  GP     [name] [place], [place] DPT [phone]  04/07/15, 9:55 AM

## 2015-04-10 NOTE — Progress Notes (Signed)
Patient reexamined earlier this morning, very rapid decline in neuro status, anisocoria with right pupil that measures 5 mm and is not reactive, no corneal reflexes, moves lower extremities to painful stimulation. Repeat CT brain showed a devastating ICH involving brainstem, thalamus, extending intraventricularly, with obstructive hydrocephalus. Diffuse global edema, effaced basilar cisterns with cerebellar herniation. Small amount of subarachnoid blood products. Spoke with neurosurgery and concurred that unfortunately this is a catastrophic nonsurvivable ICH and no intervention warranted. Previously talked to pharmacy, no protocol for post tpa thrombolysis management had demonstrated any benefit. Called daughter and informed her that patient is in a very critical situation from which is unlikely to survive.  Wyatt Portela, MD

## 2015-04-10 NOTE — Procedures (Signed)
Endotracheal Intubation Procedure Note  Indication for endotracheal intubation: impending airway compromise. Airway Assessment: Mallampati Class: III (soft and hard palate and base of uvula visible). Sedation: propofol. Paralytic: succinylcholine. Lidocaine: no. Atropine: no. Equipment: Macintosh 3 laryngoscope blade. Cricoid Pressure: no. Number of attempts: 1. ETT location confirmed by by auscultation, by CXR and ETCO2 monitor.  Samantha Bond, Samantha Bond 03/13/2015

## 2015-04-10 NOTE — Plan of Care (Addendum)
Discussed with daughter at bedside. Updated her regarding exam findings and very poor prognosis. Daughter would like to discuss with other family members and decide on the next step. However, daughter agreed with DNR and no CPR if heart stops. Will put order in.  Marvel Plan, MD PhD Stroke Neurology Apr 28, 2015 8:38 AM

## 2015-04-10 NOTE — Progress Notes (Signed)
Utilization review completed.  

## 2015-04-10 NOTE — Progress Notes (Signed)
Pt pronounced dead at 1502. Absent heart beat confirmed by two RNs, Mireyah Chervenak Simeon Craft and Serena Colonel. Family at bedside informed. MD and CDS notified.

## 2015-04-10 NOTE — Progress Notes (Addendum)
STROKE TEAM PROGRESS NOTE   HISTORY Samantha Bond is an 81 y.o. female with a past medical history significant for HTN, chronic renal failure, DM, admission 03/03/2015 for AMS / TIA, dementia, peripheral neuropathy, DJD, brought in by EMS for evaluation of altered mental status, speech and language impairment. Patient was released from rehab today, was home and woke up from a nap vomiting and became unresponsive for approximately 30 minutes was noted to have altered mental status with language impairment. There was concern of possible right facial droop en route that had resolved upon arrival to facility Brought to the ED where was noted to have fluctuating mental status and aphasia. Initial NIHSS 10. CT brain was personally reviewed and showed no acute abnormality. Patient remained with off and on changes in mental status and language impairment.   Date last known well: 2015-03-31  Time last known well: 1630  tPA Given: Yes - Activase 72 mg on Friday, 31-Mar-2015 at 2000. NIHSS: 10 MRS: 4   SUBJECTIVE (INTERVAL HISTORY) Her daughter is at the bedside.  Overnight pt had neuro decline, BP was high put on Nicardipine and repeat CT showed large brainstem hemorrhage, ICH with IVH, hydrocephalus, indicating very poor prognosis. Pt is in coma, lack of all brainstem reflexes tested. Discussed with daughter in length at bedside, made DNR, daughter needs to talk to family member regarding the further step of care.   OBJECTIVE Temp:  [97.4 F (36.3 C)-98.6 F (37 C)] 97.4 F (36.3 C) (08/20 0344) Pulse Rate:  [52-108] 61 (08/20 0736) Cardiac Rhythm:  [-] Normal sinus rhythm (08/20 0700) Resp:  [10-35] 12 (08/20 0736) BP: (83-264)/(35-110) 180/65 mmHg (08/20 0736) SpO2:  [98 %-100 %] 100 % (08/20 0736) FiO2 (%):  [30 %-100 %] 30 % (08/20 0736) Weight:  [69.4 kg (153 lb)-80.3 kg (177 lb 0.5 oz)] 69.4 kg (153 lb) (08/19 2200)  No results for input(s): GLUCAP in the last 168 hours.  Recent  Labs Lab Mar 31, 2015 1635  NA 140  K 3.7  CL 103  CO2 25  GLUCOSE 150*  BUN 24*  CREATININE 1.31*  CALCIUM 9.3    Recent Labs Lab 03/31/2015 1635  AST 28  ALT 18  ALKPHOS 96  BILITOT 0.7  PROT 6.9  ALBUMIN 3.5    Recent Labs Lab 03-31-2015 1635  WBC 4.7  NEUTROABS 2.6  HGB 11.5*  HCT 36.1  MCV 82.6  PLT 232   No results for input(s): CKTOTAL, CKMB, CKMBINDEX, TROPONINI in the last 168 hours.  Recent Labs  2015-03-31 1635  LABPROT 13.8  INR 1.03    Recent Labs  March 31, 2015 1828  COLORURINE YELLOW  LABSPEC 1.023  PHURINE 5.5  GLUCOSEU NEGATIVE  HGBUR LARGE*  BILIRUBINUR SMALL*  KETONESUR NEGATIVE  PROTEINUR 30*  UROBILINOGEN 1.0  NITRITE NEGATIVE  LEUKOCYTESUR MODERATE*       Component Value Date/Time   CHOL 263* 03/04/2015 0620   TRIG 104 03/04/2015 0620   HDL 64 03/04/2015 0620   CHOLHDL 4.1 03/04/2015 0620   VLDL 21 03/04/2015 0620   LDLCALC 178* 03/04/2015 0620   Lab Results  Component Value Date   HGBA1C 6.6* 03/03/2015   No results found for: LABOPIA, COCAINSCRNUR, LABBENZ, AMPHETMU, THCU, LABBARB  No results for input(s): ETH in the last 168 hours.   IMAGING  Ct Angio Head W/cm &/or Wo Cm 31-Mar-2015    No intracranial large or medium vessel occlusion. No anterior circulation stenosis.  Mid basilar stenosis, approximately 70% with luminal diameter  1 mm.  Diffuse intracranial small vessel irregularity.     Ct Head Wo Contrast 03/28/2015    Marked interval enlargement of LEFT pontine hemorrhage, now dissecting into the LEFT thalamus.  Intraventricular blood products consistent with extension of the parenchymal hemorrhage, with severe obstructive hydrocephalus.  Diffuse global edema, effaced basilar cisterns with cerebellar herniation. Small amount of subarachnoid blood products.  Acute findings discussed with and reconfirmed by Dr.OSVALDO CAMILO on 03/21/2015 at 5:55 am.     Ct Head Wo Contrast 2015/04/24    Vague 2 x 1.2 cm density within the  LEFT pons, which may represent hemorrhage or enhancement (possibly subacute infarct) giving contrast administration approximately ago. Stable chronic changes including severe chronic small vessel ischemic disease and old bilateral basal ganglia and thalamus lacunar infarcts.    Ct Head Wo Contrast 04-24-15    1. No acute intracranial pathology seen on CT.  2. Moderate cortical volume loss and diffuse small vessel ischemic microangiopathy.  3. Chronic ischemic change at the basal ganglia and thalami bilaterally.     Dg Chest Port 1 View 03/14/2015    1. Endotracheal tube seen ending 4 cm above the carina.  2. Mild elevation of the right hemidiaphragm. Mild right basilar airspace opacity likely reflects atelectasis.    Dg Chest Portable 1 View 04-24-2015    No acute cardiopulmonary disease.   Dg Abd Portable 1v 03/19/2015    1. Enteric tube noted ending overlying the body of the stomach, with the side port at the body of the stomach.  2. Moderate to large amount of stool noted in the colon, raising question for mild constipation.  3. Medial right basilar airspace opacity may reflect atelectasis or possibly pneumonia.     Mr Brain Ltd W/o Cm 04-24-2015    Diffusion sequences only. 1 cm acute infarction in the deep white matter adjacent to the frontal horn of the left lateral ventricle.      PHYSICAL EXAM Temp:  [97.4 F (36.3 C)-98.6 F (37 C)] 97.4 F (36.3 C) (08/20 0344) Pulse Rate:  [52-108] 57 (08/20 0800) Resp:  [10-35] 15 (08/20 0800) BP: (83-264)/(35-110) 161/66 mmHg (08/20 0800) SpO2:  [98 %-100 %] 100 % (08/20 0800) FiO2 (%):  [30 %-100 %] 30 % (08/20 0800) Weight:  [153 lb (69.4 kg)-177 lb 0.5 oz (80.3 kg)] 153 lb (69.4 kg) (08/19 2200)  General - Well nourished, well developed, in coma.  Ophthalmologic - not examed.  Cardiovascular - Regular rhythm, bradycardia.  Neuro - in coma, not respond to voice stimulation, intubated for airway protection, eyes closed,  bilateral pupil dilated and no pupillary reflex, no corneal or gag or cough, not breathing over the vent, no doll's eyes, no extremity movement on pain stimulation except trace withdraw of LLE. No babinski.   ASSESSMENT/PLAN Samantha Bond is a 81 y.o. female with history of HTN, chronic renal failure, DM, admission 03/03/2015 for AMS / TIA, dementia, peripheral neuropathy, DJD, presenting with altered mental status, decreased level of responsiveness, possible right facial droop, speech and language impairment,. She received Activase 72 mg on Friday, April 24, 2015 at 2000. Had neuro decline overnight, BP was higher than 180, was put on nicardipine drip. Repeat CT showed large brainstem ICH, left BG ICH, extension to ventricles with IVH, and hydrocephalus. Pt was intubated but not on sedation, developed coma and lose of brainstem reflex. Family updated and yet to make decision on further steps but agree on DNR.   Left subcortical infarct s/p tPA developed  ICH, IVH and hydrocephalus  Resultant coma and lose of all brainstem reflex  CT repeat showed large brainstem ICH, left BG ICH, IVH bilaterally and hydrocephalus - not compatible with life  MRI  - limited MRI showed left subcortical infarct prior to tPA given   MRA  not performed   Code status DNR  Family updated and will make decision with further steps  Carotid Doppler  unremarkable on 03/05/2015  2D Echo 03/05/2015 - EF 65-70%. Mild to moderate tricuspid regurgitation.  LDL 178 on 03/04/2015  HgbA1c 6.6 on 03/03/2015  SCDs for VTE prophylaxis after tPA given  Diet NPO time specified  aspirin 325 mg orally every day prior to admission, received tPA, and now developed hemorrhage.  Brain herniation  Large brainstem ICH, and IVH bilaterally on CT  No brainstem reflex  Poor prognosis  Not surgical candidate  Family updated and agreed on DNR and yet to make further decision  Hydrocephalus  Larger brainstem ICH  likely the cause  CT confirmed hydrocephalus  Not surgical candidate  Family updated and agreed on DNR and yet to make further decision  Hypertension  Home meds: Diltiazem  Developed BP > 180 after tPA  Put on cardene drip  BP goal < 140 due to ICH  Coma  Due to large brainstem ICH, IVH and hydrocephalus  Lost all brainstem inflex  Trace withdraw on LLE at this time  Made DNR  Family discussion for further care  Hyperlipidemia  Home meds:  Lipitor 40 mg daily  LDL 178, goal < 70  Currently NPO  Not candidate for statin at this time due to life-threatening condition  Diabetes  HgbA1c 6.6, goal < 7.0  Controlled  Other Stroke Risk Factors  Advanced age  Hx stroke/TIA  Other Active Problems  Intubated  Renal insufficiency  Other Pertinent History  Recent admission 03/03/2015 for TIA and ? seizure  Hospital day # 1  This patient is critically ill due to stroke s/p tPA but developed large ICH, IVH and hydrocephalus and at significant risk of neurological worsening, death form brain herniation. This patient's care requires constant monitoring of vital signs, hemodynamics, respiratory and cardiac monitoring, review of multiple databases, neurological assessment, discussion with family, other specialists and medical decision making of high complexity. Had long discussion with daughter at beside, updated condition and discussed about grave prognosis, family agreed on DNR but need time for further decision. I spent 50 minutes of neurocritical care time in the care of this patient.  Marvel Plan, MD PhD Stroke Neurology 03/27/2015 9:21 AM      To contact Stroke Continuity provider, please refer to [email or web address]. After hours, contact General Neurology

## 2015-04-10 NOTE — Procedures (Signed)
Extubation Procedure Note  Patient Details:   Name: Samantha Bond DOB: Dec 05, 1926 MRN: [id number]   Airway Documentation:     Evaluation  O2 sats: transiently fell during during procedure Complications: Complications of apnea Patient did not tolerate procedure well. Bilateral Breath Sounds: Diminished Suctioning: Airway No.  Pt was terminally extubated per Dr.'s Orders  [name] V 2015/04/05, 2:37 PM

## 2015-04-10 NOTE — Progress Notes (Signed)
Per HR 40's, did not suction patient.

## 2015-04-10 NOTE — Progress Notes (Signed)
245cc Morphine gtt wasted in sink. Verified by Serena Colonel, RN

## 2015-04-10 DEATH — deceased

## 2016-07-25 IMAGING — CR DG CHEST 2V
2 series · 2 of 2 positions shown · non-contrast
Comparison: 11/09/2013.

CLINICAL DATA: Elevated blood pressure.

EXAM:
CHEST  2 VIEW

[w chest pa]
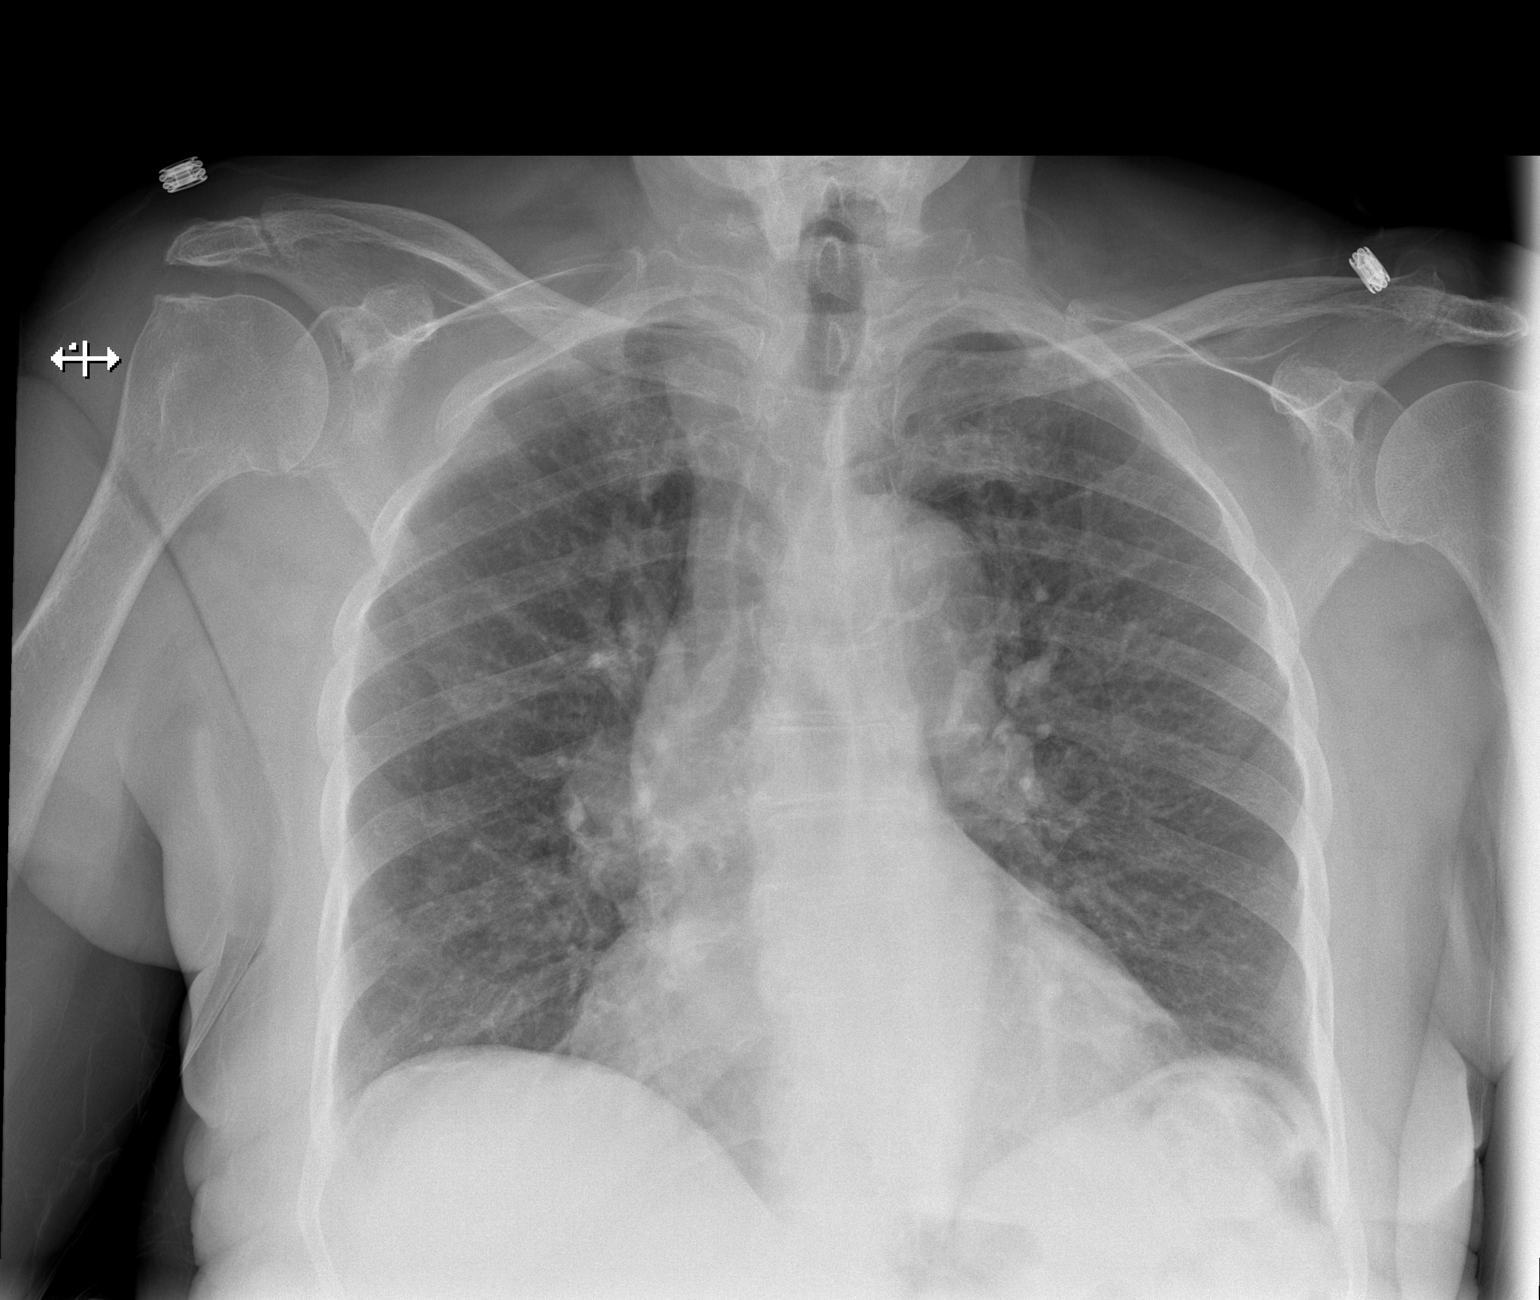

[w chest lat]
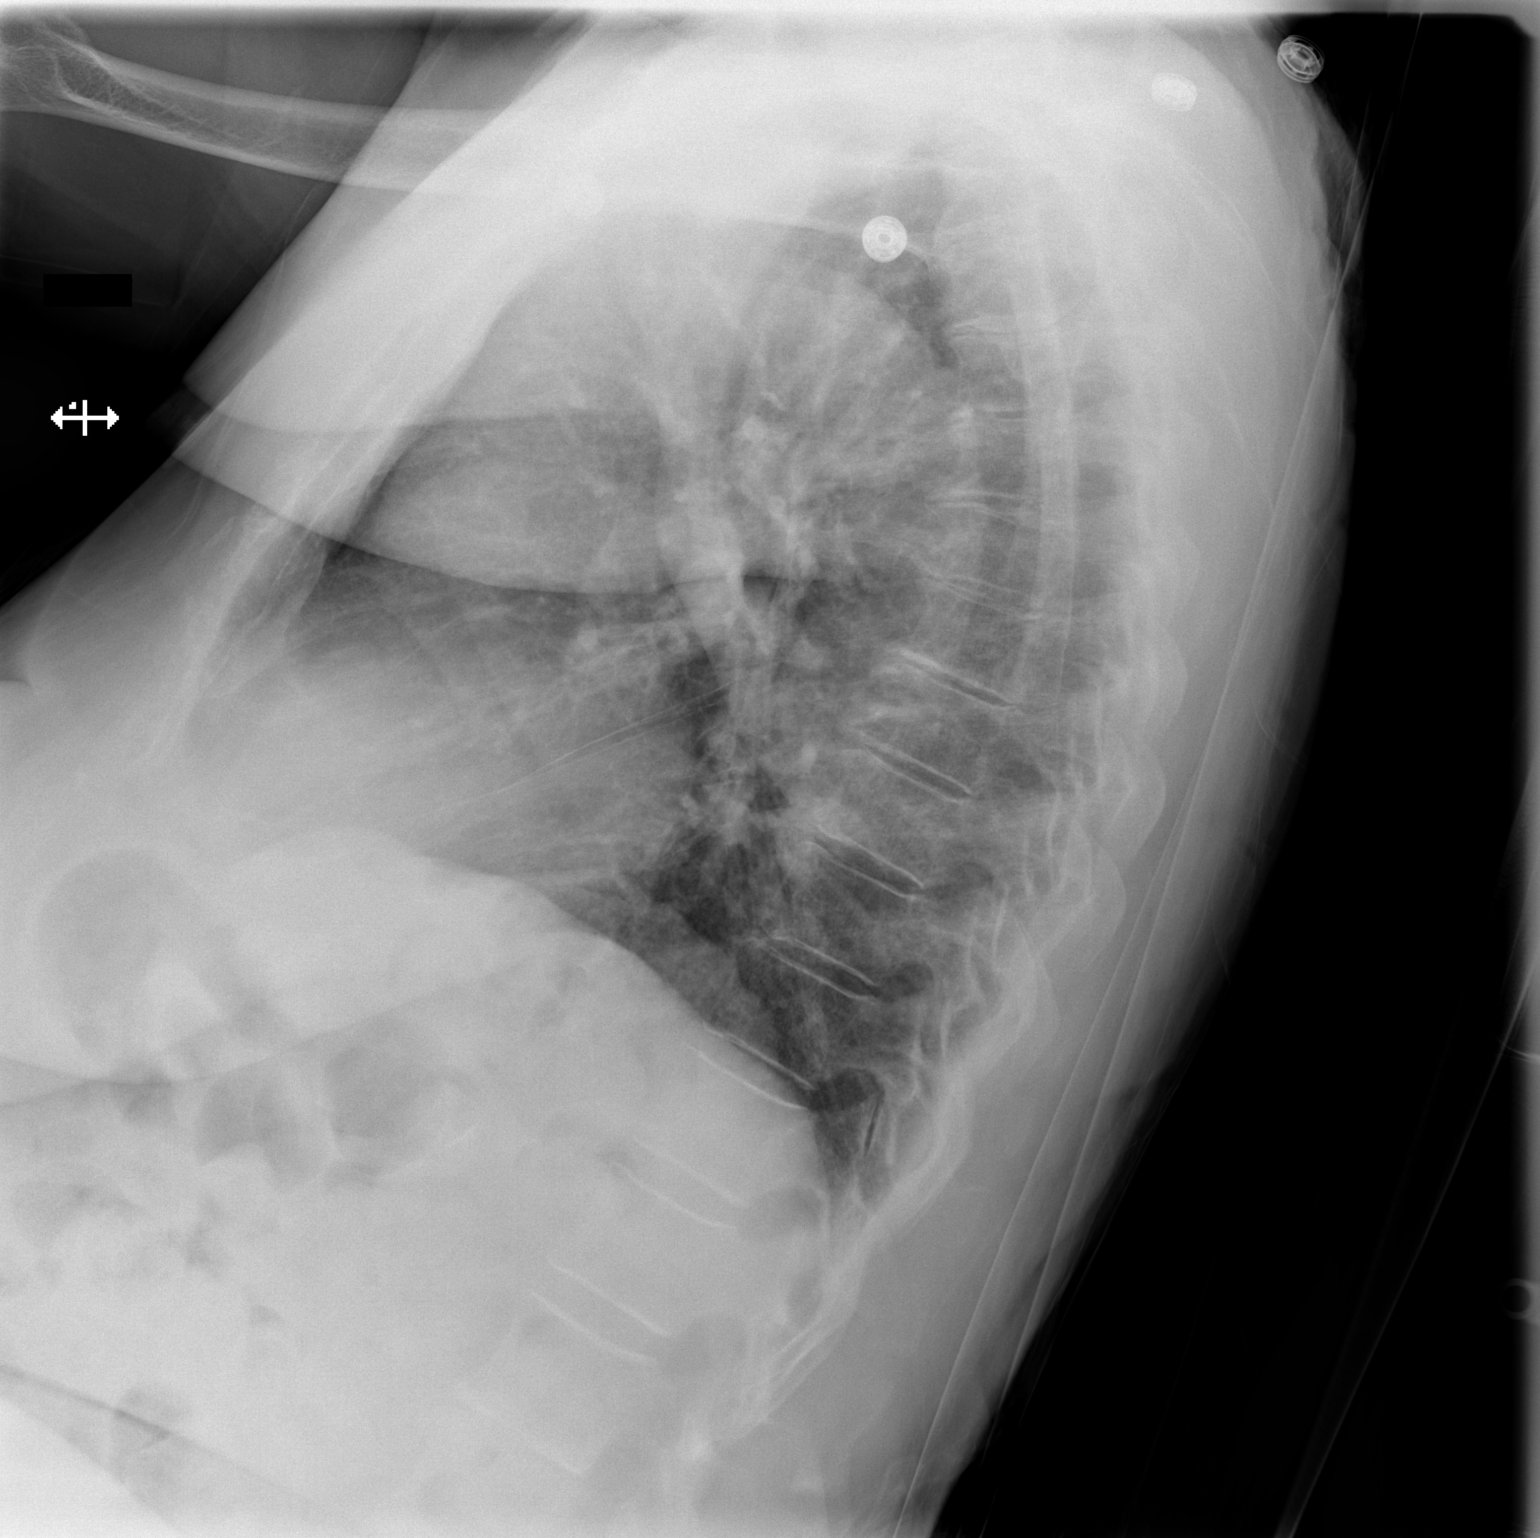

[2 of 2 positions shown; findings below may reference images not displayed]

FINDINGS: Cardiomegaly. Calcified tortuous aorta. Mild vascular congestion. No
infiltrates or failure. No effusion or pneumothorax.
IMPRESSION: Cardiomegaly. Slight worsening aeration with vascular congestion
since the prior exam. No infiltrates or failure.

## 2017-08-05 IMAGING — CT CT HEAD W/O CM
1 series · 15 of 30 positions shown, 19 images · non-contrast
Comparison: CT of the head performed 03/03/2015, and MRI/MRA of the
brain performed 03/04/2015

CLINICAL DATA: Code stroke. New onset of expressive aphasia.
Syncope and vomiting. Initial encounter.

EXAM:
CT HEAD WITHOUT CONTRAST
TECHNIQUE: Contiguous axial images were obtained from the base of the skull
through the vertex without intravenous contrast.

[Series 2: head 5.0 h30s · axial · 0.43mm/px · z∈[-140,+5]mm · 15 of 33 slices shown, 19 images]
[im 2/33  brain]
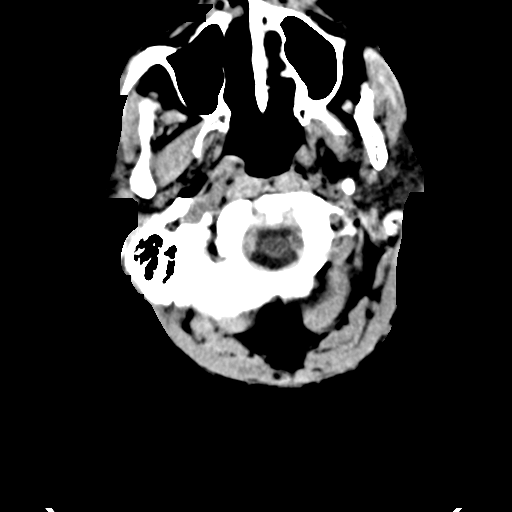
[im 2/33  bone]
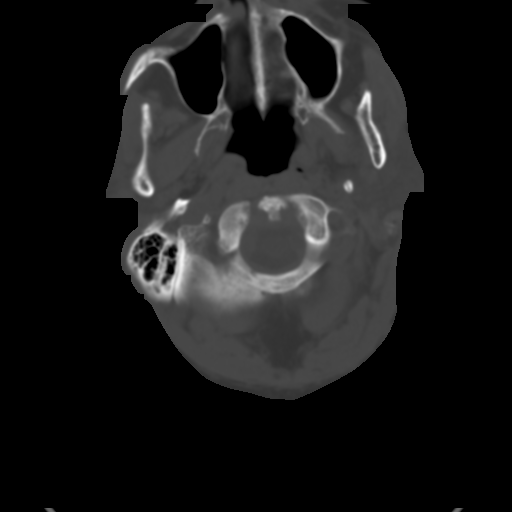
[im 4/33  brain]
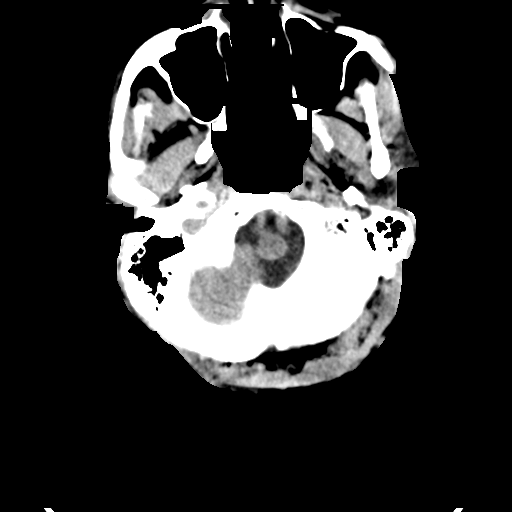
[im 6/33  brain]
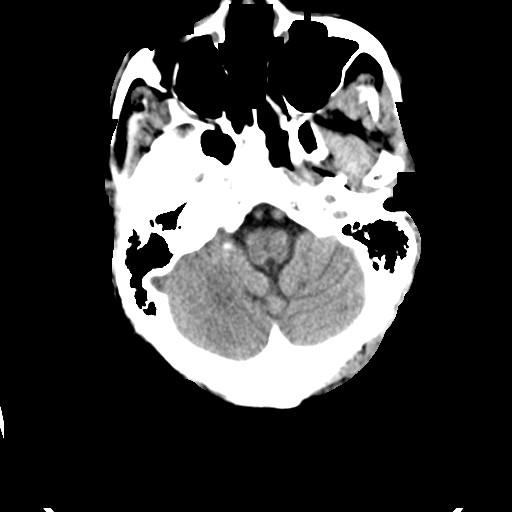
[im 8/33  brain]
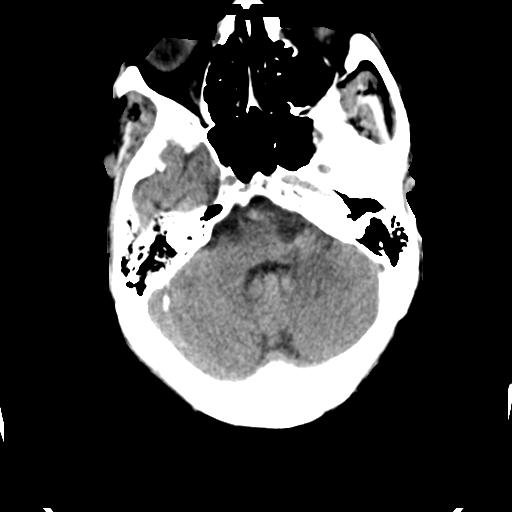
[im 10/33  brain]
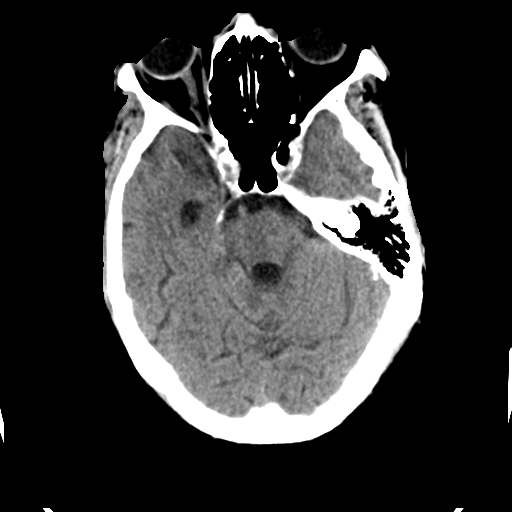
[im 10/33  bone]
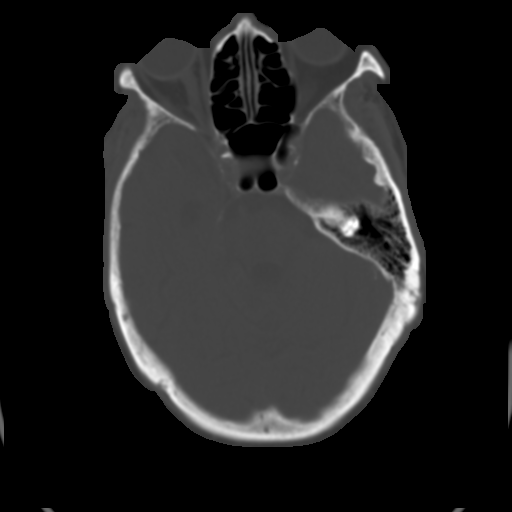
[im 13/33  brain]
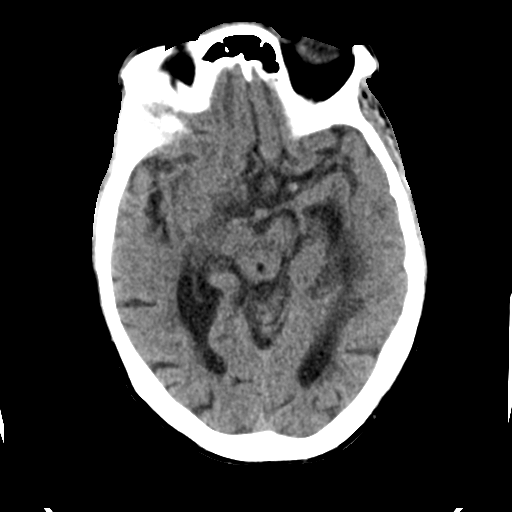
[im 15/33  brain]
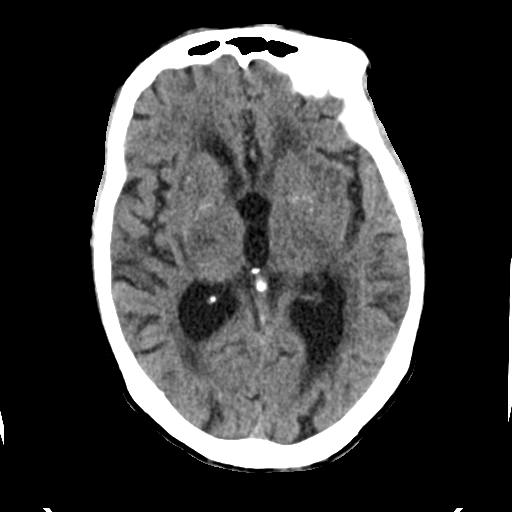
[im 17/33  brain]
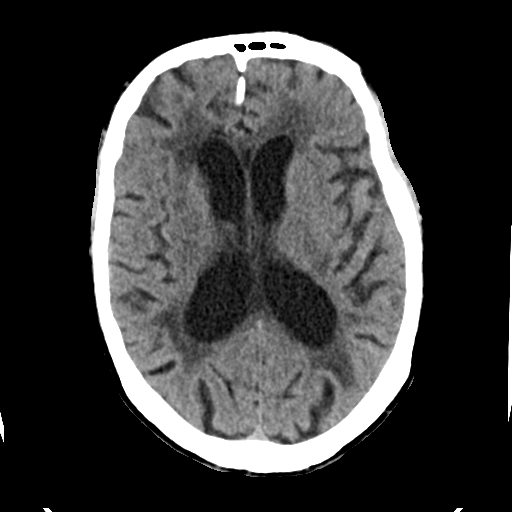
[im 18/33  brain]
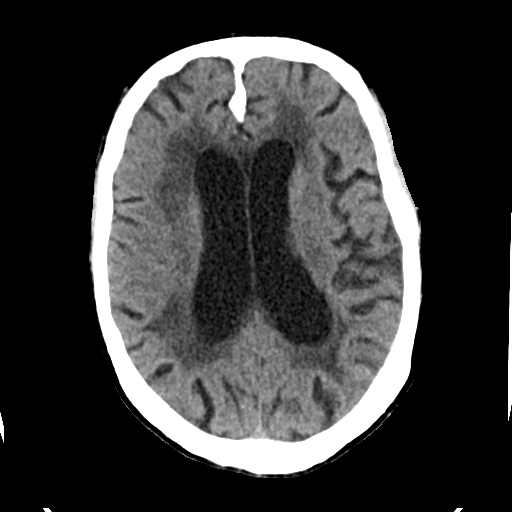
[im 18/33  bone]
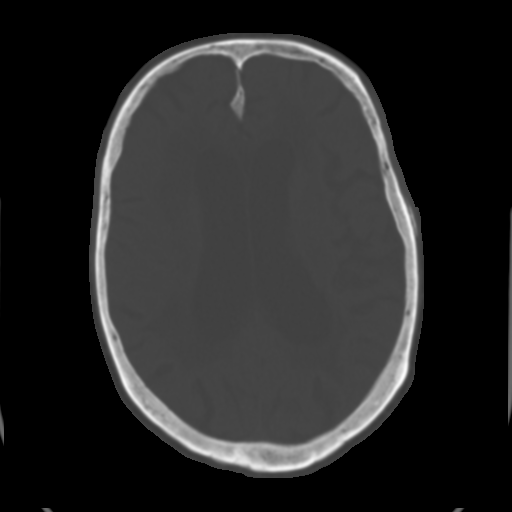
[im 20/33  brain]
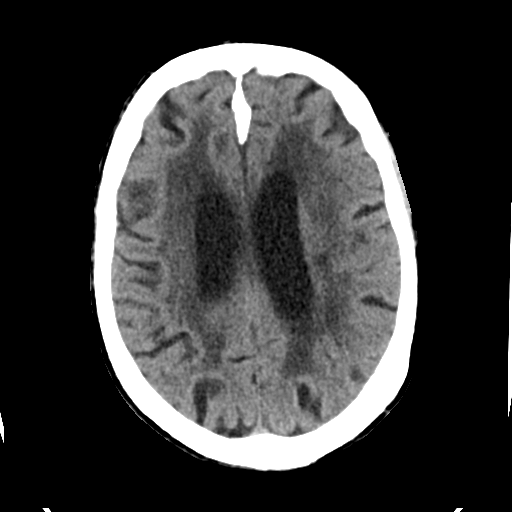
[im 23/33  brain]
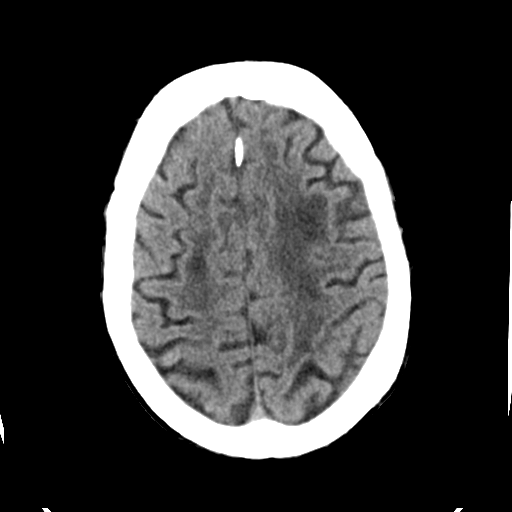
[im 25/33  brain]
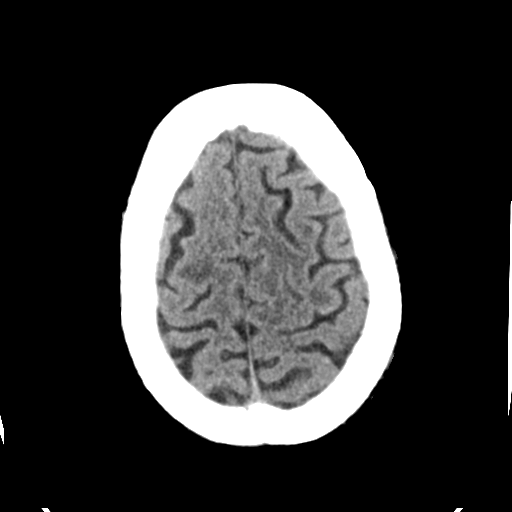
[im 27/33  brain]
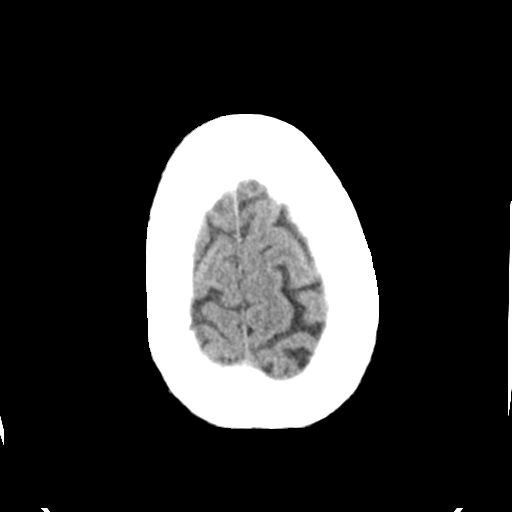
[im 27/33  bone]
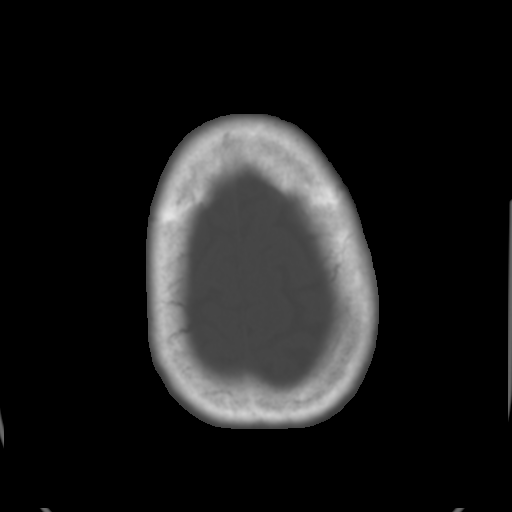
[im 29/33  brain]
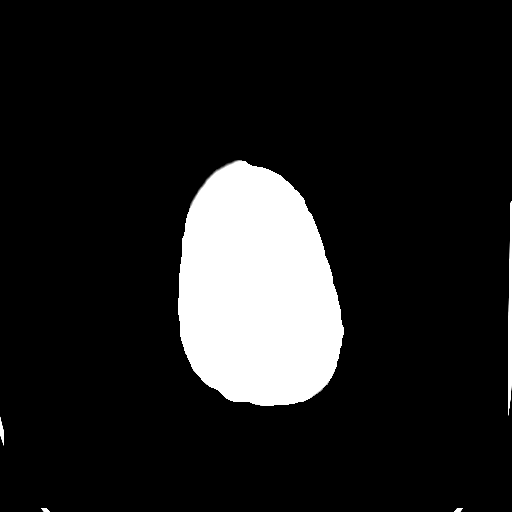
[im 31/33  brain]
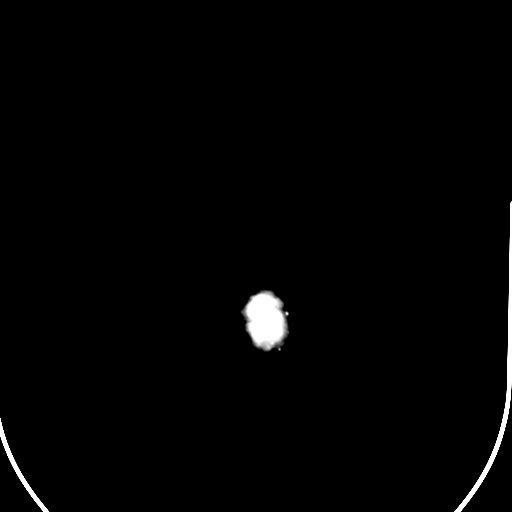

[15 of 30 positions shown; findings below may reference images not displayed]

FINDINGS: There is no evidence of acute infarction, mass lesion, or intra- or
extra-axial hemorrhage on CT.

Prominence of the ventricles and sulci reflects moderate cortical
volume loss. Diffuse periventricular and subcortical white matter
change likely reflects small vessel ischemic microangiopathy.
Chronic ischemic change is noted at the basal ganglia and thalami
bilaterally.

The brainstem and fourth ventricle are within normal limits. The
cerebral hemispheres demonstrate grossly normal gray-white
differentiation. No mass effect or midline shift is seen.

There is no evidence of fracture; visualized osseous structures are
unremarkable in appearance. The orbits are within normal limits. The
paranasal sinuses and mastoid air cells are well-aerated. No
significant soft tissue abnormalities are seen.
IMPRESSION: 1. No acute intracranial pathology seen on CT.
2. Moderate cortical volume loss and diffuse small vessel ischemic
microangiopathy.
3. Chronic ischemic change at the basal ganglia and thalami
bilaterally.
